# Patient Record
Sex: Female | Born: 1950
Health system: Southern US, Community
[De-identification: ages and names within clinical notes are randomized; demographics above are authoritative.]

## PROBLEM LIST (undated history)

## (undated) ENCOUNTER — Ambulatory Visit: Payer: Medicare Other

## (undated) DIAGNOSIS — F419 Anxiety disorder, unspecified: Secondary | ICD-10-CM

## (undated) DIAGNOSIS — J329 Chronic sinusitis, unspecified: Secondary | ICD-10-CM

## (undated) DIAGNOSIS — F32A Depression, unspecified: Secondary | ICD-10-CM

## (undated) DIAGNOSIS — Z8619 Personal history of other infectious and parasitic diseases: Secondary | ICD-10-CM

## (undated) DIAGNOSIS — G2581 Restless legs syndrome: Secondary | ICD-10-CM

## (undated) DIAGNOSIS — T7840XA Allergy, unspecified, initial encounter: Secondary | ICD-10-CM

## (undated) DIAGNOSIS — R7303 Prediabetes: Secondary | ICD-10-CM

## (undated) DIAGNOSIS — I639 Cerebral infarction, unspecified: Secondary | ICD-10-CM

## (undated) DIAGNOSIS — M419 Scoliosis, unspecified: Secondary | ICD-10-CM

## (undated) DIAGNOSIS — M720 Palmar fascial fibromatosis [Dupuytren]: Secondary | ICD-10-CM

## (undated) DIAGNOSIS — G709 Myoneural disorder, unspecified: Secondary | ICD-10-CM

## (undated) DIAGNOSIS — M797 Fibromyalgia: Secondary | ICD-10-CM

## (undated) DIAGNOSIS — B0229 Other postherpetic nervous system involvement: Secondary | ICD-10-CM

## (undated) DIAGNOSIS — B029 Zoster without complications: Secondary | ICD-10-CM

## (undated) DIAGNOSIS — F329 Major depressive disorder, single episode, unspecified: Secondary | ICD-10-CM

## (undated) DIAGNOSIS — I700 Atherosclerosis of aorta: Secondary | ICD-10-CM

## (undated) DIAGNOSIS — I6789 Other cerebrovascular disease: Secondary | ICD-10-CM

## (undated) DIAGNOSIS — J189 Pneumonia, unspecified organism: Secondary | ICD-10-CM

## (undated) DIAGNOSIS — J45909 Unspecified asthma, uncomplicated: Secondary | ICD-10-CM

## (undated) HISTORY — PX: VAGINAL HYSTERECTOMY: SUR661

## (undated) HISTORY — DX: Major depressive disorder, single episode, unspecified: F32.9

## (undated) HISTORY — PX: BLADDER REPAIR: SHX76

## (undated) HISTORY — DX: Allergy, unspecified, initial encounter: T78.40XA

## (undated) HISTORY — PX: TUBAL LIGATION: SHX77

## (undated) HISTORY — PX: TONSILLECTOMY: SUR1361

## (undated) HISTORY — PX: NASAL SINUS SURGERY: SHX719

## (undated) HISTORY — DX: Depression, unspecified: F32.A

---
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
## 2004-02-02 ENCOUNTER — Ambulatory Visit: Payer: Self-pay | Admitting: Internal Medicine

## 2005-05-17 ENCOUNTER — Ambulatory Visit: Payer: Self-pay | Admitting: Internal Medicine

## 2005-07-08 ENCOUNTER — Ambulatory Visit: Payer: Self-pay | Admitting: Gastroenterology

## 2007-09-17 ENCOUNTER — Ambulatory Visit: Payer: Self-pay | Admitting: Urology

## 2007-09-24 ENCOUNTER — Ambulatory Visit: Payer: Self-pay | Admitting: Urology

## 2007-11-17 ENCOUNTER — Ambulatory Visit: Payer: Self-pay | Admitting: Internal Medicine

## 2008-02-02 ENCOUNTER — Ambulatory Visit: Payer: Self-pay | Admitting: Urology

## 2008-02-08 ENCOUNTER — Ambulatory Visit: Payer: Self-pay | Admitting: Urology

## 2008-11-21 ENCOUNTER — Ambulatory Visit: Payer: Self-pay | Admitting: Family Medicine

## 2008-12-29 ENCOUNTER — Emergency Department: Payer: Self-pay | Admitting: Internal Medicine

## 2009-01-02 ENCOUNTER — Ambulatory Visit: Payer: Self-pay | Admitting: Gastroenterology

## 2009-01-13 ENCOUNTER — Ambulatory Visit: Payer: Self-pay | Admitting: Surgery

## 2009-01-17 ENCOUNTER — Inpatient Hospital Stay: Payer: Self-pay | Admitting: Surgery

## 2009-02-18 HISTORY — PX: COLONOSCOPY: SHX174

## 2009-02-18 HISTORY — PX: COLON SURGERY: SHX602

## 2009-04-02 ENCOUNTER — Ambulatory Visit: Payer: Self-pay | Admitting: Family Medicine

## 2009-06-19 ENCOUNTER — Ambulatory Visit: Payer: Self-pay | Admitting: Internal Medicine

## 2009-07-23 ENCOUNTER — Ambulatory Visit: Payer: Self-pay | Admitting: Internal Medicine

## 2009-08-17 ENCOUNTER — Ambulatory Visit: Payer: Self-pay | Admitting: Otolaryngology

## 2009-11-22 ENCOUNTER — Ambulatory Visit: Payer: Self-pay | Admitting: Family Medicine

## 2010-07-24 ENCOUNTER — Ambulatory Visit: Payer: Self-pay | Admitting: Gastroenterology

## 2011-02-27 ENCOUNTER — Ambulatory Visit: Payer: Self-pay | Admitting: Family Medicine

## 2011-03-11 ENCOUNTER — Ambulatory Visit: Payer: Self-pay

## 2011-04-18 DIAGNOSIS — J309 Allergic rhinitis, unspecified: Secondary | ICD-10-CM | POA: Insufficient documentation

## 2011-09-14 ENCOUNTER — Ambulatory Visit: Payer: Self-pay | Admitting: Medical

## 2011-12-23 DIAGNOSIS — E538 Deficiency of other specified B group vitamins: Secondary | ICD-10-CM | POA: Insufficient documentation

## 2012-04-28 ENCOUNTER — Ambulatory Visit: Payer: Self-pay | Admitting: Pediatrics

## 2013-08-12 ENCOUNTER — Ambulatory Visit: Payer: Self-pay | Admitting: Pediatrics

## 2013-11-15 DIAGNOSIS — F3341 Major depressive disorder, recurrent, in partial remission: Secondary | ICD-10-CM | POA: Insufficient documentation

## 2013-11-15 DIAGNOSIS — F339 Major depressive disorder, recurrent, unspecified: Secondary | ICD-10-CM | POA: Insufficient documentation

## 2013-11-15 DIAGNOSIS — M797 Fibromyalgia: Secondary | ICD-10-CM | POA: Insufficient documentation

## 2013-11-19 LAB — LIPID PANEL
Cholesterol: 173 mg/dL (ref 0–200)
HDL: 51 mg/dL (ref 35–70)
LDL Cholesterol: 100 mg/dL
TRIGLYCERIDES: 111 mg/dL (ref 40–160)

## 2013-11-19 LAB — BASIC METABOLIC PANEL: GLUCOSE: 106 mg/dL

## 2013-11-19 LAB — TSH: TSH: 2.19 u[IU]/mL (ref <=5.90)

## 2013-11-19 LAB — VITAMIN D 25 HYDROXY (VIT D DEFICIENCY, FRACTURES): Vit D, 25-Hydroxy: 38

## 2014-09-01 ENCOUNTER — Ambulatory Visit (INDEPENDENT_AMBULATORY_CARE_PROVIDER_SITE_OTHER): Payer: Managed Care, Other (non HMO) | Admitting: Family Medicine

## 2014-09-01 ENCOUNTER — Encounter: Payer: Self-pay | Admitting: Family Medicine

## 2014-09-01 VITALS — BP 120/78 | HR 60 | Ht 68.0 in | Wt 269.0 lb

## 2014-09-01 DIAGNOSIS — F329 Major depressive disorder, single episode, unspecified: Secondary | ICD-10-CM

## 2014-09-01 DIAGNOSIS — Z807 Family history of other malignant neoplasms of lymphoid, hematopoietic and related tissues: Secondary | ICD-10-CM | POA: Insufficient documentation

## 2014-09-01 DIAGNOSIS — M797 Fibromyalgia: Secondary | ICD-10-CM | POA: Diagnosis not present

## 2014-09-01 DIAGNOSIS — E538 Deficiency of other specified B group vitamins: Secondary | ICD-10-CM | POA: Diagnosis not present

## 2014-09-01 DIAGNOSIS — K5792 Diverticulitis of intestine, part unspecified, without perforation or abscess without bleeding: Secondary | ICD-10-CM

## 2014-09-01 DIAGNOSIS — R599 Enlarged lymph nodes, unspecified: Secondary | ICD-10-CM | POA: Diagnosis not present

## 2014-09-01 DIAGNOSIS — B029 Zoster without complications: Secondary | ICD-10-CM

## 2014-09-01 DIAGNOSIS — E559 Vitamin D deficiency, unspecified: Secondary | ICD-10-CM

## 2014-09-01 DIAGNOSIS — R739 Hyperglycemia, unspecified: Secondary | ICD-10-CM | POA: Diagnosis not present

## 2014-09-01 DIAGNOSIS — Z283 Underimmunization status: Secondary | ICD-10-CM | POA: Diagnosis not present

## 2014-09-01 DIAGNOSIS — R590 Localized enlarged lymph nodes: Secondary | ICD-10-CM

## 2014-09-01 DIAGNOSIS — J309 Allergic rhinitis, unspecified: Secondary | ICD-10-CM | POA: Diagnosis not present

## 2014-09-01 DIAGNOSIS — Z289 Immunization not carried out for unspecified reason: Secondary | ICD-10-CM

## 2014-09-01 DIAGNOSIS — F32A Depression, unspecified: Secondary | ICD-10-CM

## 2014-09-01 DIAGNOSIS — Z833 Family history of diabetes mellitus: Secondary | ICD-10-CM | POA: Insufficient documentation

## 2014-09-01 DIAGNOSIS — E669 Obesity, unspecified: Secondary | ICD-10-CM | POA: Insufficient documentation

## 2014-09-01 DIAGNOSIS — F419 Anxiety disorder, unspecified: Secondary | ICD-10-CM | POA: Insufficient documentation

## 2014-09-01 DIAGNOSIS — Z8249 Family history of ischemic heart disease and other diseases of the circulatory system: Secondary | ICD-10-CM | POA: Insufficient documentation

## 2014-09-01 DIAGNOSIS — I10 Essential (primary) hypertension: Secondary | ICD-10-CM | POA: Insufficient documentation

## 2014-09-01 HISTORY — DX: Diverticulitis of intestine, part unspecified, without perforation or abscess without bleeding: K57.92

## 2014-09-01 LAB — VITAMIN B12: Vitamin B-12: 542

## 2014-09-01 LAB — VITAMIN D 25 HYDROXY (VIT D DEFICIENCY, FRACTURES): Vit D, 25-Hydroxy: 38

## 2014-09-01 MED ORDER — VALACYCLOVIR HCL 1 G PO TABS: 1000.0000 mg | ORAL_TABLET | Freq: Every day | ORAL | Status: DC

## 2014-09-01 MED ORDER — VITAMIN D3 125 MCG (5000 UT) PO CAPS: 1.0000 | ORAL_CAPSULE | Freq: Every day | ORAL | Status: DC

## 2014-09-01 MED ORDER — VITAMIN B-12 1000 MCG PO TABS: 1000.0000 ug | ORAL_TABLET | Freq: Every day | ORAL | Status: DC

## 2014-09-01 NOTE — Progress Notes (Signed)
Date:  09/01/2014   Name:  Ann Peters   DOB:  14-Jan-1951   MRN:  [id number]  PCP:  Schuyler Amor, MD    Chief Complaint: Herpes Zoster   History of Present Illness:  This is a 64 y.o. female with hx recurrent zoster affecting L side of neck, currently on Valtrex for outbreak last week, concerned with frequent recurrence and hard lymph node behind L ear. Never tried suppressive therapy or saw ID specialist, tolerates Valtrex well. Has had multiple nasal surgeries and partial colectomy and thinks that affected her immune system leading to recurrent infectons. Takes Cymbalta for depression and fibromylagia (previously on Prozac), working well. Gets yearly mammogram which she schedules herself. Tetanus status unknown, no pneumococcal vaccines yet. On IM B12 monthly for several years, has never tried oral replacement. Takes asa for prevention, no hx diabetes or CV dz.  Review of Systems:  Review of Systems  Constitutional: Negative for fever.  HENT: Negative for ear pain and sore throat.   Eyes: Negative for pain.  Respiratory: Negative for chest tightness.   Cardiovascular: Negative for chest pain, palpitations and leg swelling.  Gastrointestinal: Negative for abdominal pain.  Endocrine: Negative for polyuria.  Genitourinary: Negative for flank pain and difficulty urinating.  Musculoskeletal: Negative for back pain.  Neurological: Negative for seizures and syncope.  Psychiatric/Behavioral: Positive for decreased concentration. Negative for confusion.    Patient Active Problem List   Diagnosis Date Noted  . Anxiety 09/01/2014  . Diverticulitis 09/01/2014  . Vitamin D deficiency 09/01/2014  . Herpes zoster 09/01/2014  . Hyperglycemia 09/01/2014  . FH: diabetes mellitus 09/01/2014  . FHx: heart disease 09/01/2014  . FH: lymphoma 09/01/2014  . Reactive cervical lymphadenopathy 09/01/2014  . Obesity, morbid, BMI 40.0-49.9 09/01/2014  . Clinical depression 11/15/2013  . Fibromyalgia  11/15/2013  . Vitamin B12 deficiency anemia 12/23/2011  . Allergic rhinitis 04/18/2011    Prior to Admission medications   Medication Sig Start Date End Date Taking? Authorizing Provider  DULoxetine (CYMBALTA) 60 MG capsule Take 60 mg by mouth daily. 11/15/13 11/15/14 Yes Historical Provider, MD  fluticasone (FLONASE) 50 MCG/ACT nasal spray Place 1 spray into both nostrils daily.   Yes Historical Provider, MD  valACYclovir (VALTREX) 1000 MG tablet Take 1 tablet (1,000 mg total) by mouth daily. 09/01/14   Schuyler Amor, MD  vitamin B-12 (CYANOCOBALAMIN) 1000 MCG tablet Take 1 tablet (1,000 mcg total) by mouth daily. 09/01/14   Schuyler Amor, MD    Allergies  Allergen Reactions  . Amoxicillin-Pot Clavulanate Other (See Comments)    Past Surgical History  Procedure Laterality Date  . Vaginal hysterectomy    . Tonsillectomy    . Tubal ligation    . Colonoscopy  2011    polyp removed- Dr Marva Panda  . Colon surgery      part of colon removed    History  Substance Use Topics  . Smoking status: Never Smoker   . Smokeless tobacco: Not on file  . Alcohol Use: No    No family history on file.  Medication list has been reviewed and updated.  Physical Examination: BP 120/78 mmHg  Pulse 60  Ht  (1.727 m)  Wt 269 lb (122.018 kg)  BMI 40.91 kg/m2  Physical Exam  Constitutional: She is oriented to person, place, and time. She appears well-developed and well-nourished.  HENT:  Head: Normocephalic and atraumatic.  Right Ear: External ear normal.  Left Ear: External ear normal.  Mouth/Throat: Oropharynx is clear and  moist.  Eyes: Conjunctivae and EOM are normal. Pupils are equal, round, and reactive to light. No scleral icterus.  Neck: Neck supple. No thyromegaly present.  Hard mobile sl tender 0.5 cm node behind L ear  Cardiovascular: Normal rate, regular rhythm and normal heart sounds.   Pulmonary/Chest: Effort normal and breath sounds normal.  Abdominal: Soft. She exhibits  no distension and no mass. There is no tenderness.  Musculoskeletal: She exhibits no edema.  Neurological: She is alert and oriented to person, place, and time. Coordination normal.  Skin: Skin is warm and dry. No rash noted.  Psychiatric: She has a normal mood and affect. Her behavior is normal.    Assessment and Plan:  1. Herpes zoster Recurrent, will try suppressive therapy with daily Valtrex, consider ID referral if ineffective - Comprehensive Metabolic Panel (CMET) - CBC  2. Reactive cervical lymphadenopathy Benign, reassured re: FH lymphoma  3. Hyperglycemia Check a1c, FH diabetes - HgB A1c  4. Vitamin D deficiency Level ok 10/15, continue supplementation  5. Obesity, morbid, BMI 40.0-49.9 Discussed weight loss, increased exercise  6. Clinical depression Well controlled on Cymbalta  7. Vitamin B12 deficiency Level ok 10/15, continue supplementation  8. Fibromyalgia Well controlled on Cymbalta  9. Allergic rhinitis, unspecified allergic rhinitis type Well controlled on Flonase  10. Delayed immunizations Needs Tdap and Prevnar (not available today), discuss next visit  11. Chronic asa use Not indicated (3728yr CV risk 4.7%), recommend d/c  Return in about 4 weeks (around 09/29/2014).  [name] [name], Jr. MD [facility]  09/01/2014

## 2014-09-01 NOTE — Patient Instructions (Signed)
Take vitamin B12 1000 mcg daily (over-the-counter) in place of vitamin B12 injections.

## 2014-09-02 LAB — CBC
HEMATOCRIT: 39.3 % (ref 34.0–46.6)
Hemoglobin: 13 g/dL (ref 11.1–15.9)
MCH: 30.4 pg (ref 26.6–33.0)
MCHC: 33.1 g/dL (ref 31.5–35.7)
MCV: 92 fL (ref 79–97)
Platelets: 369 10*3/uL (ref 150–379)
RBC: 4.28 x10E6/uL (ref 3.77–5.28)
RDW: 16.7 % — ABNORMAL HIGH (ref 12.3–15.4)
WBC: 4.9 10*3/uL (ref 3.4–10.8)

## 2014-09-02 LAB — HEMOGLOBIN A1C
ESTIMATED AVERAGE GLUCOSE: 131 mg/dL
Hgb A1c MFr Bld: 6.2 % — ABNORMAL HIGH (ref 4.8–5.6)

## 2014-09-02 LAB — COMPREHENSIVE METABOLIC PANEL
ALT: 16 IU/L (ref 0–32)
AST: 17 IU/L (ref 0–40)
Albumin/Globulin Ratio: 1.5 (ref 1.1–2.5)
Albumin: 4.3 g/dL (ref 3.6–4.8)
Alkaline Phosphatase: 106 IU/L (ref 39–117)
BILIRUBIN TOTAL: 0.3 mg/dL (ref 0.0–1.2)
BUN / CREAT RATIO: 19 (ref 11–26)
BUN: 15 mg/dL (ref 8–27)
CALCIUM: 9.9 mg/dL (ref 8.7–10.3)
CO2: 22 mmol/L (ref 18–29)
CREATININE: 0.78 mg/dL (ref 0.57–1.00)
Chloride: 99 mmol/L (ref 97–108)
GFR calc Af Amer: 92 mL/min/{1.73_m2} (ref >59)
GFR calc non Af Amer: 80 mL/min/{1.73_m2} (ref >59)
GLOBULIN, TOTAL: 2.9 g/dL (ref 1.5–4.5)
Glucose: 99 mg/dL (ref 65–99)
Potassium: 4.7 mmol/L (ref 3.5–5.2)
Sodium: 140 mmol/L (ref 134–144)
Total Protein: 7.2 g/dL (ref 6.0–8.5)

## 2014-09-08 ENCOUNTER — Other Ambulatory Visit: Payer: Self-pay

## 2014-09-08 DIAGNOSIS — B029 Zoster without complications: Secondary | ICD-10-CM

## 2014-09-08 MED ORDER — VALACYCLOVIR HCL 1 G PO TABS: 1000.0000 mg | ORAL_TABLET | Freq: Every day | ORAL | Status: DC

## 2014-10-03 ENCOUNTER — Ambulatory Visit (INDEPENDENT_AMBULATORY_CARE_PROVIDER_SITE_OTHER): Payer: Managed Care, Other (non HMO) | Admitting: Family Medicine

## 2014-10-03 ENCOUNTER — Encounter: Payer: Self-pay | Admitting: Family Medicine

## 2014-10-03 VITALS — BP 122/72 | HR 80 | Ht 68.0 in | Wt 266.0 lb

## 2014-10-03 DIAGNOSIS — E538 Deficiency of other specified B group vitamins: Secondary | ICD-10-CM | POA: Diagnosis not present

## 2014-10-03 DIAGNOSIS — B029 Zoster without complications: Secondary | ICD-10-CM | POA: Diagnosis not present

## 2014-10-03 DIAGNOSIS — J309 Allergic rhinitis, unspecified: Secondary | ICD-10-CM | POA: Diagnosis not present

## 2014-10-03 DIAGNOSIS — Z23 Encounter for immunization: Secondary | ICD-10-CM

## 2014-10-03 DIAGNOSIS — H6091 Unspecified otitis externa, right ear: Secondary | ICD-10-CM

## 2014-10-03 DIAGNOSIS — E559 Vitamin D deficiency, unspecified: Secondary | ICD-10-CM | POA: Diagnosis not present

## 2014-10-03 DIAGNOSIS — R7303 Prediabetes: Secondary | ICD-10-CM

## 2014-10-03 DIAGNOSIS — M797 Fibromyalgia: Secondary | ICD-10-CM

## 2014-10-03 DIAGNOSIS — R7309 Other abnormal glucose: Secondary | ICD-10-CM | POA: Diagnosis not present

## 2014-10-03 MED ORDER — PRAMOXINE-HC-CHLOROXYLENOL AQ 10-10-1 MG/ML OT SOLN: 4.0000 [drp] | Freq: Four times a day (QID) | OTIC | Status: DC

## 2014-10-03 NOTE — Progress Notes (Signed)
Date:  10/03/2014   Name:  Ann Peters   DOB:  02-Mar-1950   MRN:  [id number]  PCP:  Schuyler Amor, MD    Chief Complaint: No chief complaint on file.   History of Present Illness:  This is a 64 y.o. female for f/u MMP. C/o R ear pain past week, no discharge, hearing ok. Blood work last visit showed prediabetes, now on NCS diet and trying to lose weight (down 3# since last visit) and exercise more. Needs Tdap and Prevnar today. Reports Valtrex causing strange dreams but otherwise tolerating well.  Review of Systems:  Review of Systems  Respiratory: Negative for shortness of breath.   Cardiovascular: Negative for chest pain and leg swelling.  Endocrine: Negative for polyuria.    Patient Active Problem List   Diagnosis Date Noted  . Anxiety 09/01/2014  . Diverticulitis 09/01/2014  . Vitamin D deficiency 09/01/2014  . Herpes zoster 09/01/2014  . Hyperglycemia 09/01/2014  . FH: diabetes mellitus 09/01/2014  . FHx: heart disease 09/01/2014  . FH: lymphoma 09/01/2014  . Reactive cervical lymphadenopathy 09/01/2014  . Obesity, morbid, BMI 40.0-49.9 09/01/2014  . Clinical depression 11/15/2013  . Fibromyalgia 11/15/2013  . Vitamin B12 deficiency 12/23/2011  . Allergic rhinitis 04/18/2011    Prior to Admission medications   Medication Sig Start Date End Date Taking? Authorizing Provider  Cholecalciferol (VITAMIN D3) 5000 UNITS CAPS Take 1 capsule (5,000 Units total) by mouth daily. 09/01/14  Yes Schuyler Amor, MD  DULoxetine (CYMBALTA) 60 MG capsule Take 60 mg by mouth daily. 11/15/13 11/15/14 Yes Historical Provider, MD  fluticasone (FLONASE) 50 MCG/ACT nasal spray Place 1 spray into both nostrils daily.   Yes Historical Provider, MD  valACYclovir (VALTREX) 1000 MG tablet Take 1 tablet (1,000 mg total) by mouth daily. 09/08/14  Yes Schuyler Amor, MD  vitamin B-12 (CYANOCOBALAMIN) 1000 MCG tablet Take 1 tablet (1,000 mcg total) by mouth daily. 09/01/14  Yes Schuyler Amor, MD   Pramoxine-HC-Chloroxylenol Aq (CORTANE-B AQUEOUS) 10-10-1 MG/ML SOLN Place 4 drops in ear(s) 4 (four) times daily. 10/03/14   Schuyler Amor, MD    Allergies  Allergen Reactions  . Amoxicillin-Pot Clavulanate Other (See Comments)    Past Surgical History  Procedure Laterality Date  . Vaginal hysterectomy    . Tonsillectomy    . Tubal ligation    . Colonoscopy  2011    polyp removed- Dr Marva Panda  . Colon surgery      part of colon removed    Social History  Substance Use Topics  . Smoking status: Never Smoker   . Smokeless tobacco: None  . Alcohol Use: No    No family history on file.  Medication list has been reviewed and updated.  Physical Examination: BP 122/72 mmHg  Pulse 80  Ht  (1.727 m)  Wt 266 lb (120.657 kg)  BMI 40.45 kg/m2  Physical Exam  Constitutional: She appears well-developed and well-nourished.  HENT:  R EAC sl swollen and TM inflammed  Cardiovascular: Normal rate, regular rhythm and normal heart sounds.  Exam reveals no gallop.   No murmur heard. Pulmonary/Chest: Effort normal and breath sounds normal. She has no wheezes. She has no rales.  Musculoskeletal: She exhibits no edema.  Neurological: She is alert.  Skin: Skin is warm and dry.  Psychiatric: She has a normal mood and affect. Her behavior is normal.    Assessment and Plan:  1. Otitis externa, right Call if sxs worsen/persist - Pramoxine-HC-Chloroxylenol Aq (CORTANE-B AQUEOUS) 10-10-1 MG/ML  SOLN; Place 4 drops in ear(s) 4 (four) times daily.  Dispense: 10 mL; Refill: 0  2. Prediabetes Continue NCS diet, weight loss, recheck a1c next visit  3. Fibromyalgia Stable, continue Cymbalta  4. Herpes zoster Recurrent, continue daily Valtrex  5. Obesity, morbid, BMI 40.0-49.9 Encouraged exercise, weight loss  6. Vitamin B12 deficiency Continue supplementation, recheck next visit  7. Vitamin D deficiency Continue supplementation, recheck next visit  8. Allergic rhinitis,  unspecified allergic rhinitis type Stable, continue Flonase  Return in about 3 months (around 01/03/2015).  Dionne Ano. Kingsley Spittle MD [facility]  10/03/2014

## 2014-10-12 ENCOUNTER — Telehealth: Payer: Self-pay

## 2014-10-12 MED ORDER — NEOMYCIN-POLYMYXIN-HC 3.5-10000-1 OT SOLN: 4.0000 [drp] | Freq: Four times a day (QID) | OTIC | Status: DC

## 2014-10-12 NOTE — Telephone Encounter (Signed)
Patient states you sent her something for ear pain, they do not make it anymore, says [facility] sent you a message to change but has not received an answer yet.dr

## 2014-11-07 ENCOUNTER — Ambulatory Visit
Admission: EM | Admit: 2014-11-07 | Discharge: 2014-11-07 | Disposition: A | Discharge status: Home / Self Care | Payer: Commercial Indemnity | Attending: Family Medicine | Admitting: Family Medicine

## 2014-11-07 DIAGNOSIS — J0100 Acute maxillary sinusitis, unspecified: Secondary | ICD-10-CM | POA: Diagnosis not present

## 2014-11-07 MED ORDER — AZITHROMYCIN 250 MG PO TABS: ORAL_TABLET | ORAL | Status: DC

## 2014-11-07 MED ORDER — PREDNISONE 20 MG PO TABS: 40.0000 mg | ORAL_TABLET | Freq: Every day | ORAL | Status: DC

## 2014-11-07 NOTE — Discharge Instructions (Signed)
Take medication as prescribed. Rest. Drink plenty of fluids.   Follow up with your primary care physician as need next week. Return to Urgent care for new or worsening concerns.   Sinusitis Sinusitis is redness, soreness, and puffiness (inflammation) of the air pockets in the bones of your face (sinuses). The redness, soreness, and puffiness can cause air and mucus to get trapped in your sinuses. This can allow germs to grow and cause an infection.  HOME CARE   Drink enough fluids to keep your pee (urine) clear or pale yellow.  Use a humidifier in your home.  Run a hot shower to create steam in the bathroom. Sit in the bathroom with the door closed. Breathe in the steam 3-4 times a day.  Put a warm, moist washcloth on your face 3-4 times a day, or as told by your doctor.  Use salt water sprays (saline sprays) to wet the thick fluid in your nose. This can help the sinuses drain.  Only take medicine as told by your doctor. GET HELP RIGHT AWAY IF:   Your pain gets worse.  You have very bad headaches.  You are sick to your stomach (nauseous).  You throw up (vomit).  You are very sleepy (drowsy) all the time.  Your face is puffy (swollen).  Your vision changes.  You have a stiff neck.  You have trouble breathing. MAKE SURE YOU:   Understand these instructions.  Will watch your condition.  Will get help right away if you are not doing well or get worse. Document Released: 07/24/2007 Document Revised: 10/30/2011 Document Reviewed: 09/10/2011 [facility] Patient Information 2015 [place], [place]. This information is not intended to replace advice given to you by your health care provider. Make sure you discuss any questions you have with your health care provider.

## 2014-11-07 NOTE — ED Provider Notes (Signed)
[facility] Emergency Department Provider Note  ____________________________________________  Time seen: Approximately 7:00 PM  I have reviewed the triage vital signs and the nursing notes.   HISTORY  Chief Complaint Facial Pain and Otalgia   HPI JUDIANN CELIA is a 64 y.o. female presents for complaints of one week of runny nose and right ear pain. Reports sinus pressure at 6/10. States right ear treated by PCP with antibiotic ear drops and has improved. States long history of sinus infections and sinus pain, states feels similar. States feels trigger was seasonal allergies but has progressed. Denies chest pain, shortness of breath, fever or other complaints. Reports continues to eat and drink well.   Past Medical History  Diagnosis Date  . Depression     Patient Active Problem List   Diagnosis Date Noted  . Anxiety 09/01/2014  . Diverticulitis 09/01/2014  . Vitamin D deficiency 09/01/2014  . Herpes zoster 09/01/2014  . Hyperglycemia 09/01/2014  . FH: diabetes mellitus 09/01/2014  . FHx: heart disease 09/01/2014  . FH: lymphoma 09/01/2014  . Reactive cervical lymphadenopathy 09/01/2014  . Obesity, morbid, BMI 40.0-49.9 09/01/2014  . Clinical depression 11/15/2013  . Fibromyalgia 11/15/2013  . Vitamin B12 deficiency 12/23/2011  . Allergic rhinitis 04/18/2011    Past Surgical History  Procedure Laterality Date  . Vaginal hysterectomy    . Tonsillectomy    . Tubal ligation    . Colonoscopy  2011    polyp removed- Dr Marva Panda  . Colon surgery      part of colon removed    Current Outpatient Rx  Name  Route  Sig  Dispense  Refill  . aspirin EC 81 MG tablet   Oral   Take 81 mg by mouth daily.         . Cholecalciferol (VITAMIN D3) 5000 UNITS CAPS   Oral   Take 1 capsule (5,000 Units total) by mouth daily.   30 capsule      . DULoxetine (CYMBALTA) 60 MG capsule   Oral   Take 60 mg by mouth daily.         . fluticasone (FLONASE)  50 MCG/ACT nasal spray   Each Nare   Place 1 spray into both nostrils daily.         Marland Kitchen neomycin-polymyxin-hydrocortisone (CORTISPORIN) otic solution   Right Ear   Place 4 drops into the right ear 4 (four) times daily.   10 mL   0   . valACYclovir (VALTREX) 1000 MG tablet   Oral   Take 1 tablet (1,000 mg total) by mouth daily.   90 tablet   3   . vitamin B-12 (CYANOCOBALAMIN) 1000 MCG tablet   Oral   Take 1 tablet (1,000 mcg total) by mouth daily.   30 tablet   0   .           Marland Kitchen             Allergies Amoxicillin-pot clavulanate and Penicillins  Family History  Problem Relation Age of Onset  . Cancer Mother   . Lymphoma Mother     Social History Social History  Substance Use Topics  . Smoking status: Never Smoker   . Smokeless tobacco: Never Used  . Alcohol Use: No    Review of Systems Constitutional: No fever/chills Eyes: No visual changes. ENT: sinus pressure, nasal congestion, intermittent right ear pain.  Cardiovascular: Denies chest pain. Respiratory: Denies shortness of breath. Gastrointestinal: No abdominal pain.  No nausea, no  vomiting.  No diarrhea.  No constipation. Genitourinary: Negative for dysuria. Musculoskeletal: Negative for back pain. Skin: Negative for rash. Neurological: Negative for headaches, focal weakness or numbness.  10-point ROS otherwise negative.  ____________________________________________   PHYSICAL EXAM:  VITAL SIGNS: ED Triage Vitals  Enc Vitals Group     BP 11/07/14 1849 128/85 mmHg     Pulse Rate 11/07/14 1849 72     Resp 11/07/14 1849 16     Temp 11/07/14 1849 97.9 F (36.6 C)     Temp Source 11/07/14 1849 Oral     SpO2 11/07/14 1849 99 %     Weight 11/07/14 1849 268 lb (121.564 kg)     Height 11/07/14 1849 5' 8.5" (1.74 m)     Head Cir --      Peak Flow --      Pain Score 11/07/14 1853 6     Pain Loc --      Pain Edu? --      Excl. in GC? --     Constitutional: Alert and oriented. Well appearing  and in no acute distress. Eyes: Conjunctivae are normal. PERRL. EOMI. Head: Atraumatic.mod TTP right maxillary sinus, mild to mod TTP left and right frontal sinus TTP.   Ears: no erythema, normal TMs bilaterally. No swelling or exudate.   Nose: clear rhinorrhea, boggy nasal turbinates.   Mouth/Throat: Mucous membranes are moist.  Oropharynx non-erythematous. Neck: No stridor.  No cervical spine tenderness to palpation. Hematological/Lymphatic/Immunilogical: No cervical lymphadenopathy. Cardiovascular: Normal rate, regular rhythm. Grossly normal heart sounds.  Good peripheral circulation. Respiratory: Normal respiratory effort.  No retractions. Lungs CTAB. Gastrointestinal: Soft and nontender. No distention. Normal Bowel sounds.  No abdominal bruits. No CVA tenderness. Musculoskeletal: No lower or upper extremity tenderness nor edema.  No joint effusions. Bilateral pedal pulses equal and easily palpated.  Neurologic:  Normal speech and language. No gross focal neurologic deficits are appreciated. No gait instability. Skin:  Skin is warm, dry and intact. No rash noted. Psychiatric: Mood and affect are normal. Speech and behavior are normal. ____________________________________________   LABS (all labs ordered are listed, but only abnormal results are displayed)  Labs Reviewed - No data to display  INITIAL IMPRESSION / ASSESSMENT AND PLAN / ED COURSE  Pertinent labs & imaging results that were available during my care of the patient were reviewed by me and considered in my medical decision making (see chart for details).  Very well appearing. Presents with complaints of sinus congestion and pressure x one week. History of x 3 sinus surgeries with multiple sinus infections. Will treat sinusitis with azithromycin, 3 day prednisone and discussed supportive treatments. Discussed follow up with Primary care physician this week. Discussed follow up and return parameters including no resolution or  any worsening concerns. Patient verbalized understanding and agreed to plan.   ____________________________________________   FINAL CLINICAL IMPRESSION(S) / ED DIAGNOSES  Final diagnoses:  Acute maxillary sinusitis, recurrence not specified  Acute frontal sinusitis      Renford Dills, NP 11/07/14 2040

## 2014-11-07 NOTE — ED Notes (Signed)
X 5 days. Right sided facial pain and otalgia. Pt reports her maxillary and frontal sinuses are painful. Pt uses Flonase, with relief. Pt is going out of town tomorrow, and is hoping to get relief before her trip. Pt plans to see Dr. Genevive Bi after her trip.

## 2015-01-03 ENCOUNTER — Encounter: Payer: Self-pay | Admitting: Family Medicine

## 2015-01-03 ENCOUNTER — Ambulatory Visit (INDEPENDENT_AMBULATORY_CARE_PROVIDER_SITE_OTHER): Payer: Managed Care, Other (non HMO) | Admitting: Family Medicine

## 2015-01-03 VITALS — BP 110/72 | HR 72 | Ht 68.5 in | Wt 274.0 lb

## 2015-01-03 DIAGNOSIS — F32A Depression, unspecified: Secondary | ICD-10-CM

## 2015-01-03 DIAGNOSIS — E559 Vitamin D deficiency, unspecified: Secondary | ICD-10-CM | POA: Diagnosis not present

## 2015-01-03 DIAGNOSIS — E538 Deficiency of other specified B group vitamins: Secondary | ICD-10-CM | POA: Diagnosis not present

## 2015-01-03 DIAGNOSIS — Z23 Encounter for immunization: Secondary | ICD-10-CM | POA: Diagnosis not present

## 2015-01-03 DIAGNOSIS — R7303 Prediabetes: Secondary | ICD-10-CM

## 2015-01-03 DIAGNOSIS — F329 Major depressive disorder, single episode, unspecified: Secondary | ICD-10-CM | POA: Diagnosis not present

## 2015-01-03 DIAGNOSIS — M797 Fibromyalgia: Secondary | ICD-10-CM | POA: Diagnosis not present

## 2015-01-03 MED ORDER — DULOXETINE HCL 60 MG PO CPEP: 60.0000 mg | ORAL_CAPSULE | Freq: Every day | ORAL | Status: DC

## 2015-01-03 NOTE — Progress Notes (Signed)
Date:  01/03/2015   Name:  Ann Peters   DOB:  08/04/1950   MRN:  [id number]  PCP:  Schuyler AmorWilliam Jermall Isaacson, MD    Chief Complaint: No chief complaint on file.   History of Present Illness:  This is a 64 y.o. female for f/u prediabetes, depression/fibromyalgia, and B12/D deficiencies. ROE did not improve with drops and saw MUC placed on Zpak and steroids, pain persisted so saw ENT and dx'd with PHN affecting ear. Has been drinking more water to help with prediabetes. Fibromyalgia sxs well controlled but feeling more melancholy and motivation lower so wonders about augmentation with Abilify as seen on TV. Weight up 8#, unsure why. Taking B12 and vit D supplements as well as daily asa. Needs flu imm today and Pneumovax next visit.  Review of Systems:  Review of Systems  Constitutional: Negative for fever.  Respiratory: Negative for shortness of breath.   Cardiovascular: Negative for chest pain and leg swelling.  Gastrointestinal: Negative for abdominal pain.  Endocrine: Negative for polyuria.  Genitourinary: Negative for difficulty urinating.  Neurological: Negative for syncope and light-headedness.    Patient Active Problem List   Diagnosis Date Noted  . Prediabetes 01/03/2015  . Anxiety 09/01/2014  . Diverticulitis 09/01/2014  . Vitamin D deficiency 09/01/2014  . Herpes zoster 09/01/2014  . Hyperglycemia 09/01/2014  . FH: diabetes mellitus 09/01/2014  . FHx: heart disease 09/01/2014  . FH: lymphoma 09/01/2014  . Reactive cervical lymphadenopathy 09/01/2014  . Obesity, morbid, BMI 40.0-49.9 (HCC) 09/01/2014  . Clinical depression 11/15/2013  . Fibromyalgia 11/15/2013  . Vitamin B12 deficiency 12/23/2011  . Allergic rhinitis 04/18/2011    Prior to Admission medications   Medication Sig Start Date End Date Taking? Authorizing Provider  aspirin EC 81 MG tablet Take 81 mg by mouth daily.   Yes Historical Provider, MD  Cholecalciferol (VITAMIN D3) 5000 UNITS CAPS Take 1 capsule (5,000  Units total) by mouth daily. 09/01/14  Yes Schuyler AmorWilliam Kasia Trego, MD  fluticasone (FLONASE) 50 MCG/ACT nasal spray Place 1 spray into both nostrils daily.   Yes Historical Provider, MD  valACYclovir (VALTREX) 1000 MG tablet Take 1 tablet (1,000 mg total) by mouth daily. 09/08/14  Yes Schuyler AmorWilliam Ethelle Ola, MD  vitamin B-12 (CYANOCOBALAMIN) 1000 MCG tablet Take 1 tablet (1,000 mcg total) by mouth daily. 09/01/14  Yes Schuyler AmorWilliam Rafeal Skibicki, MD  DULoxetine (CYMBALTA) 60 MG capsule Take 1 capsule (60 mg total) by mouth daily. 01/03/15 01/03/16  Schuyler AmorWilliam Presley Gora, MD    Allergies  Allergen Reactions  . Amoxicillin-Pot Clavulanate Other (See Comments)  . Penicillins Nausea And Vomiting    Past Surgical History  Procedure Laterality Date  . Vaginal hysterectomy    . Tonsillectomy    . Tubal ligation    . Colonoscopy  2011    polyp removed- Dr Marva PandaSkulskie  . Colon surgery      part of colon removed    Social History  Substance Use Topics  . Smoking status: Never Smoker   . Smokeless tobacco: Never Used  . Alcohol Use: No    Family History  Problem Relation Age of Onset  . Cancer Mother   . Lymphoma Mother     Medication list has been reviewed and updated.  Physical Examination: BP 110/72 mmHg  Pulse 72  Ht 5' 8.5" (1.74 m)  Wt 274 lb (124.286 kg)  BMI 41.05 kg/m2  Physical Exam  Constitutional: She appears well-developed and well-nourished.  Cardiovascular: Normal rate, regular rhythm and normal heart sounds.   Pulmonary/Chest:  Effort normal and breath sounds normal.  Musculoskeletal: She exhibits no edema.  Neurological: She is alert.  Skin: Skin is warm and dry.  Psychiatric: She has a normal mood and affect. Her behavior is normal.  Nursing note and vitals reviewed.   Assessment and Plan:  1. Prediabetes Unclear control - HgB A1c  2. Vitamin B12 deficiency On oral supplementation - B12  3. Vitamin D deficiency On supplementation - Vitamin D (25 hydroxy)  4. Obesity, morbid, BMI  40.0-49.9 (HCC) Discussed exercise/weight loss - TSH  5. Clinical depression Refill Cymbalta, do not think Abilify risks outweigh benefits in this setting - DULoxetine (CYMBALTA) 60 MG capsule; Take 1 capsule (60 mg total) by mouth daily.  Dispense: 90 capsule; Refill: 3  6. Fibromyalgia Well controlled, cont Cymbalta - DULoxetine (CYMBALTA) 60 MG capsule; Take 1 capsule (60 mg total) by mouth daily.  Dispense: 90 capsule; Refill: 3  7. Flu vaccine need - Flu Vaccine QUAD 36+ mos PF IM (Fluarix & Fluzone Quad PF)  8. Chronic asa use No clear indication (10 yr CV risk 4%), discuss d/c again next visit  Return in about 3 months (around 04/05/2015). Consider Pneumovax then.  Dionne Ano. Kingsley Spittle MD [facility]  01/03/2015

## 2015-01-04 LAB — HEMOGLOBIN A1C
Est. average glucose Bld gHb Est-mCnc: 137 mg/dL
Hgb A1c MFr Bld: 6.4 % — ABNORMAL HIGH (ref 4.8–5.6)

## 2015-01-04 LAB — VITAMIN B12: Vitamin B-12: 288 pg/mL (ref 211–946)

## 2015-01-04 LAB — VITAMIN D 25 HYDROXY (VIT D DEFICIENCY, FRACTURES): Vit D, 25-Hydroxy: 28.5 ng/mL — ABNORMAL LOW (ref 30.0–100.0)

## 2015-01-04 LAB — TSH: TSH: 2.52 u[IU]/mL (ref 0.450–4.500)

## 2015-03-06 DIAGNOSIS — M1612 Unilateral primary osteoarthritis, left hip: Secondary | ICD-10-CM | POA: Diagnosis not present

## 2015-03-06 DIAGNOSIS — M25552 Pain in left hip: Secondary | ICD-10-CM | POA: Diagnosis not present

## 2015-03-13 DIAGNOSIS — M1612 Unilateral primary osteoarthritis, left hip: Secondary | ICD-10-CM | POA: Diagnosis not present

## 2015-03-13 DIAGNOSIS — M25552 Pain in left hip: Secondary | ICD-10-CM | POA: Diagnosis not present

## 2015-04-24 ENCOUNTER — Telehealth: Payer: Self-pay

## 2015-04-24 ENCOUNTER — Other Ambulatory Visit: Payer: Self-pay | Admitting: Family Medicine

## 2015-04-24 DIAGNOSIS — B029 Zoster without complications: Secondary | ICD-10-CM

## 2015-04-24 MED ORDER — VALACYCLOVIR HCL 1 G PO TABS
1000.0000 mg | ORAL_TABLET | Freq: Every day | ORAL | Status: DC
Start: 2015-04-24 — End: 2015-04-25

## 2015-04-24 NOTE — Telephone Encounter (Signed)
Sent to [place] 

## 2015-04-24 NOTE — Telephone Encounter (Signed)
Done

## 2015-04-25 ENCOUNTER — Other Ambulatory Visit: Payer: Self-pay

## 2015-04-25 ENCOUNTER — Telehealth: Payer: Self-pay

## 2015-04-25 DIAGNOSIS — B029 Zoster without complications: Secondary | ICD-10-CM

## 2015-04-25 MED ORDER — VALACYCLOVIR HCL 1 G PO TABS: 1000.0000 mg | ORAL_TABLET | Freq: Every day | ORAL | Status: DC

## 2015-04-25 NOTE — Telephone Encounter (Signed)
Sent to [place] 

## 2015-04-25 NOTE — Addendum Note (Signed)
Addended by: Schuyler AmorPLONK, Raygen Linquist on: 04/25/2015 09:52 AM   Modules accepted: Orders

## 2015-05-04 DIAGNOSIS — M1612 Unilateral primary osteoarthritis, left hip: Secondary | ICD-10-CM | POA: Diagnosis not present

## 2015-05-04 DIAGNOSIS — M25552 Pain in left hip: Secondary | ICD-10-CM | POA: Diagnosis not present

## 2015-05-08 ENCOUNTER — Encounter: Payer: Self-pay | Admitting: Family Medicine

## 2015-05-08 ENCOUNTER — Ambulatory Visit (INDEPENDENT_AMBULATORY_CARE_PROVIDER_SITE_OTHER): Payer: Medicare Other | Admitting: Family Medicine

## 2015-05-08 VITALS — BP 132/78 | HR 75 | Resp 16 | Ht 68.5 in | Wt 272.5 lb

## 2015-05-08 DIAGNOSIS — R7303 Prediabetes: Secondary | ICD-10-CM

## 2015-05-08 DIAGNOSIS — Z23 Encounter for immunization: Secondary | ICD-10-CM | POA: Diagnosis not present

## 2015-05-08 DIAGNOSIS — M797 Fibromyalgia: Secondary | ICD-10-CM

## 2015-05-08 DIAGNOSIS — B351 Tinea unguium: Secondary | ICD-10-CM | POA: Diagnosis not present

## 2015-05-08 DIAGNOSIS — E559 Vitamin D deficiency, unspecified: Secondary | ICD-10-CM

## 2015-05-08 DIAGNOSIS — E538 Deficiency of other specified B group vitamins: Secondary | ICD-10-CM

## 2015-05-08 MED ORDER — DULOXETINE HCL 60 MG PO CPEP: 60.0000 mg | ORAL_CAPSULE | Freq: Every day | ORAL | Status: DC

## 2015-05-08 MED ORDER — CICLOPIROX 8 % EX SOLN: Freq: Every day | CUTANEOUS | Status: DC

## 2015-05-08 NOTE — Patient Instructions (Signed)
Stop daily aspirin  

## 2015-05-09 LAB — HEMOGLOBIN A1C
Est. average glucose Bld gHb Est-mCnc: 123 mg/dL
Hgb A1c MFr Bld: 5.9 % — ABNORMAL HIGH (ref 4.8–5.6)

## 2015-05-09 LAB — VITAMIN B12: VITAMIN B 12: 568 pg/mL (ref 211–946)

## 2015-05-09 LAB — VITAMIN D 25 HYDROXY (VIT D DEFICIENCY, FRACTURES): Vit D, 25-Hydroxy: 37.9 ng/mL (ref 30.0–100.0)

## 2015-05-10 DIAGNOSIS — B351 Tinea unguium: Secondary | ICD-10-CM | POA: Insufficient documentation

## 2015-05-10 NOTE — Progress Notes (Signed)
Date:  05/08/2015   Name:  [name]   DOB:  08/15/1950   MRN:  [id number]  PCP:  Schuyler AmorWilliam Silveria Botz, MD    Chief Complaint: Blood Sugar Problem   History of Present Illness:  This is a 65 y.o. female seen for 4 month f/u. Prediabetes well controlled on NCS diet, last a1c 6.4 in Nov. Taking vit D and B12 supplements. Weight down 2#. Fibromyalgia sxs stable, needs refill Cymbalta. Had minor episode shingles since last visit but feels Valtrex suppression helping immensely. Concerned re: fungal infection L great toe. Needs Pneumovax.  Review of Systems:  Review of Systems  Constitutional: Negative for fever.  Respiratory: Negative for cough and shortness of breath.   Cardiovascular: Negative for chest pain and leg swelling.  Endocrine: Negative for polyuria.  Genitourinary: Negative for difficulty urinating.  Neurological: Negative for syncope and light-headedness.    Patient Active Problem List   Diagnosis Date Noted  . Prediabetes 01/03/2015  . Diverticulitis 09/01/2014  . Vitamin D deficiency 09/01/2014  . Herpes zoster 09/01/2014  . FH: diabetes mellitus 09/01/2014  . FHx: heart disease 09/01/2014  . FH: lymphoma 09/01/2014  . Reactive cervical lymphadenopathy 09/01/2014  . Obesity, morbid, BMI 40.0-49.9 (HCC) 09/01/2014  . Clinical depression 11/15/2013  . Fibromyalgia 11/15/2013  . Vitamin B12 deficiency 12/23/2011  . Allergic rhinitis 04/18/2011    Prior to Admission medications   Medication Sig Start Date End Date Taking? Authorizing Provider  Cholecalciferol (VITAMIN D3) 5000 UNITS CAPS Take 1 capsule (5,000 Units total) by mouth daily. 09/01/14  Yes Schuyler AmorWilliam Sammy Douthitt, MD  DULoxetine (CYMBALTA) 60 MG capsule Take 1 capsule (60 mg total) by mouth daily. 05/08/15 05/07/16 Yes Schuyler AmorWilliam Caeden Foots, MD  fluticasone (FLONASE) 50 MCG/ACT nasal spray Place 1 spray into both nostrils daily.   Yes Historical Provider, MD  Magnesium Aluminum Silicate POWD by Does not apply route.   Yes  Historical Provider, MD  valACYclovir (VALTREX) 1000 MG tablet Take 1 tablet (1,000 mg total) by mouth daily. 04/25/15  Yes Schuyler AmorWilliam Stephan Draughn, MD  vitamin B-12 (CYANOCOBALAMIN) 1000 MCG tablet Take 1 tablet (1,000 mcg total) by mouth daily. 09/01/14  Yes Schuyler AmorWilliam Emanii Bugbee, MD  ciclopirox (PENLAC) 8 % solution Apply topically at bedtime. Apply daily over affected nail and surrounding skin. 05/08/15   Schuyler AmorWilliam Lovette Merta, MD    Allergies  Allergen Reactions  . Amoxicillin-Pot Clavulanate Other (See Comments)  . Penicillins Nausea And Vomiting    Past Surgical History  Procedure Laterality Date  . Vaginal hysterectomy    . Tonsillectomy    . Tubal ligation    . Colonoscopy  2011    polyp removed- Dr Marva PandaSkulskie  . Colon surgery      part of colon removed    Social History  Substance Use Topics  . Smoking status: Never Smoker   . Smokeless tobacco: Never Used  . Alcohol Use: No    Family History  Problem Relation Age of Onset  . Cancer Mother   . Lymphoma Mother     Medication list has been reviewed and updated.  Physical Examination: BP 132/78 mmHg  Pulse 75  Resp 16  Ht 5' 8.5" (1.74 m)  Wt 272 lb 8 oz (123.605 kg)  BMI 40.83 kg/m2  SpO2 98%  Physical Exam  Constitutional: She appears well-developed and well-nourished.  Cardiovascular: Normal rate, regular rhythm and normal heart sounds.   Pulmonary/Chest: Breath sounds normal.  Musculoskeletal: She exhibits no edema.  Neurological: She is alert.  Skin: Skin  is warm and dry.  Fungal involvement L great toenail  Psychiatric: She has a normal mood and affect. Her behavior is normal.  Nursing note and vitals reviewed.   Assessment and Plan:  1. Fibromyalgia Well controlled, refill Cymbalta - DULoxetine (CYMBALTA) 60 MG capsule; Take 1 capsule (60 mg total) by mouth daily.  Dispense: 90 capsule; Refill: 3  2. Prediabetes Well controlled on NCS diet - HgB A1c  3. Onychomycosis of left great toe Penlac soln daily, discussed  potential se's oral antifungal  4. Vitamin B12 deficiency On supplement - B12  5. Vitamin D deficiency On supplement - Vitamin D (25 hydroxy)  6. Need for Pneumovax Given  7. Med review No clear indication for asa use, d/c  Return in about 6 months (around 11/08/2015).  Dionne Ano. Kingsley Spittle MD [facility]  05/10/2015

## 2015-08-30 DIAGNOSIS — M1612 Unilateral primary osteoarthritis, left hip: Secondary | ICD-10-CM | POA: Diagnosis not present

## 2015-08-30 DIAGNOSIS — M25552 Pain in left hip: Secondary | ICD-10-CM | POA: Diagnosis not present

## 2015-09-11 ENCOUNTER — Other Ambulatory Visit: Payer: Self-pay | Admitting: Physician Assistant

## 2015-09-27 ENCOUNTER — Other Ambulatory Visit: Payer: Self-pay

## 2015-09-28 ENCOUNTER — Encounter (HOSPITAL_COMMUNITY)
Admission: RE | Admit: 2015-09-28 | Discharge: 2015-09-28 | Disposition: A | Discharge status: Home / Self Care | Payer: Medicare Other | Source: Ambulatory Visit | Attending: Orthopaedic Surgery | Admitting: Orthopaedic Surgery

## 2015-09-28 ENCOUNTER — Encounter (HOSPITAL_COMMUNITY): Payer: Self-pay

## 2015-09-28 DIAGNOSIS — F419 Anxiety disorder, unspecified: Secondary | ICD-10-CM | POA: Insufficient documentation

## 2015-09-28 DIAGNOSIS — E559 Vitamin D deficiency, unspecified: Secondary | ICD-10-CM | POA: Diagnosis not present

## 2015-09-28 DIAGNOSIS — R7303 Prediabetes: Secondary | ICD-10-CM | POA: Insufficient documentation

## 2015-09-28 DIAGNOSIS — Z880 Allergy status to penicillin: Secondary | ICD-10-CM | POA: Diagnosis not present

## 2015-09-28 DIAGNOSIS — Z833 Family history of diabetes mellitus: Secondary | ICD-10-CM | POA: Insufficient documentation

## 2015-09-28 DIAGNOSIS — Z79899 Other long term (current) drug therapy: Secondary | ICD-10-CM | POA: Insufficient documentation

## 2015-09-28 DIAGNOSIS — Z01812 Encounter for preprocedural laboratory examination: Secondary | ICD-10-CM | POA: Diagnosis not present

## 2015-09-28 DIAGNOSIS — Z8249 Family history of ischemic heart disease and other diseases of the circulatory system: Secondary | ICD-10-CM | POA: Insufficient documentation

## 2015-09-28 DIAGNOSIS — M797 Fibromyalgia: Secondary | ICD-10-CM | POA: Diagnosis not present

## 2015-09-28 DIAGNOSIS — E538 Deficiency of other specified B group vitamins: Secondary | ICD-10-CM | POA: Insufficient documentation

## 2015-09-28 DIAGNOSIS — Z6839 Body mass index (BMI) 39.0-39.9, adult: Secondary | ICD-10-CM | POA: Insufficient documentation

## 2015-09-28 DIAGNOSIS — M1612 Unilateral primary osteoarthritis, left hip: Secondary | ICD-10-CM | POA: Diagnosis not present

## 2015-09-28 DIAGNOSIS — F329 Major depressive disorder, single episode, unspecified: Secondary | ICD-10-CM | POA: Diagnosis not present

## 2015-09-28 HISTORY — DX: Personal history of other infectious and parasitic diseases: Z86.19

## 2015-09-28 HISTORY — DX: Myoneural disorder, unspecified: G70.9

## 2015-09-28 HISTORY — DX: Anxiety disorder, unspecified: F41.9

## 2015-09-28 HISTORY — DX: Pneumonia, unspecified organism: J18.9

## 2015-09-28 LAB — CBC
HEMATOCRIT: 40.1 % (ref 36.0–46.0)
HEMOGLOBIN: 13.3 g/dL (ref 12.0–15.0)
MCH: 31.9 pg (ref 26.0–34.0)
MCHC: 33.2 g/dL (ref 30.0–36.0)
MCV: 96.2 fL (ref 78.0–100.0)
Platelets: 319 10*3/uL (ref 150–400)
RBC: 4.17 MIL/uL (ref 3.87–5.11)
RDW: 14.8 % (ref 11.5–15.5)
WBC: 5.4 10*3/uL (ref 4.0–10.5)

## 2015-09-28 LAB — BASIC METABOLIC PANEL
Anion gap: 11 (ref 5–15)
BUN: 15 mg/dL (ref 6–20)
CO2: 20 mmol/L — ABNORMAL LOW (ref 22–32)
Calcium: 9.7 mg/dL (ref 8.9–10.3)
Chloride: 105 mmol/L (ref 101–111)
Creatinine, Ser: 0.97 mg/dL (ref 0.44–1.00)
GFR calc Af Amer: >60 mL/min (ref >60)
GFR calc non Af Amer: 60 mL/min — ABNORMAL LOW (ref >60)
Glucose, Bld: 121 mg/dL — ABNORMAL HIGH (ref 65–99)
Potassium: 4.2 mmol/L (ref 3.5–5.1)
Sodium: 136 mmol/L (ref 135–145)

## 2015-09-28 LAB — SURGICAL PCR SCREEN
MRSA, PCR: NEGATIVE
Staphylococcus aureus: NEGATIVE

## 2015-09-28 NOTE — Pre-Procedure Instructions (Signed)
Ann Peters  09/28/2015      [id number] - [address], [place] - [address] [place] [address] Phone: [phone] Fax: [phone]  [id number] - [place], [place] - [address] AT [facility] [address] [facility] [address] Phone: [phone] Fax: [phone]    Your procedure is scheduled on  Tuesday 10/10/15  Report to [facility] Admitting at 1100 A.M.  Call this number if you have problems the morning of surgery:  [phone]   Remember:  Do not eat food or drink liquids after midnight.  Take these medicines the morning of surgery with A SIP OF WATER DULOXETINE (CYMBALTA)  STOP 7 DAYS PRIOR TO SURGERY ASPIRIN OR ASPIRIN PRODUCTS, IBUPROFEN/ ADVIL/ MOTRIN/ ALEVE, GOODY POWDERS/ BC'S, VITAMINS, HERBAL MEDS)   Do not wear jewelry, make-up or nail polish.  Do not wear lotions, powders, or perfumes.  You may wear deoderant.  Do not shave 48 hours prior to surgery.  Men may shave face and neck.  Do not bring valuables to the hospital.  [facility] is not responsible for any belongings or valuables.  Contacts, dentures or bridgework may not be worn into surgery.  Leave your suitcase in the car.  After surgery it may be brought to your room.  For patients admitted to the hospital, discharge time will be determined by your treatment team.  Patients discharged the day of surgery will not be allowed to drive home.   Name and phone number of your driver:   Special instructions:  [place] - Preparing for Surgery  Before surgery, you can play an important role.  Because skin is not sterile, your skin needs to be as free of germs as possible.  You can reduce the number of germs on you skin by washing with CHG (chlorahexidine gluconate) soap before surgery.  CHG is an antiseptic cleaner which kills germs and bonds with the skin to continue killing germs even after washing.  Please DO NOT use if  you have an allergy to CHG or antibacterial soaps.  If your skin becomes reddened/irritated stop using the CHG and inform your nurse when you arrive at Short Stay.  Do not shave (including legs and underarms) for at least 48 hours prior to the first CHG shower.  You may shave your face.  Please follow these instructions carefully:   1.  Shower with CHG Soap the night before surgery and the                                morning of Surgery.  2.  If you choose to wash your hair, wash your hair first as usual with your       normal shampoo.  3.  After you shampoo, rinse your hair and body thoroughly to remove the                      Shampoo.  4.  Use CHG as you would any other liquid soap.  You can apply chg directly       to the skin and wash gently with scrungie or a clean washcloth.  5.  Apply the CHG Soap to your body ONLY FROM THE NECK DOWN.        Do not use on open wounds or open sores.  Avoid contact with your eyes,       ears, mouth and genitals (private parts).  Wash genitals (private parts)       with your normal soap.  6.  Wash thoroughly, paying special attention to the area where your surgery        will be performed.  7.  Thoroughly rinse your body with warm water from the neck down.  8.  DO NOT shower/wash with your normal soap after using and rinsing off       the CHG Soap.  9.  Pat yourself dry with a clean towel.            10.  Wear clean pajamas.            11.  Place clean sheets on your bed the night of your first shower and do not        sleep with pets.  Day of Surgery  Do not apply any lotions/deoderants the morning of surgery.  Please wear clean clothes to the hospital/surgery center.    Please read over the following fact sheets that you were given. Coughing and Deep Breathing, MRSA Information and Surgical Site Infection Prevention

## 2015-10-09 MED ORDER — CLINDAMYCIN PHOSPHATE 900 MG/50ML IV SOLN
900.0000 mg | INTRAVENOUS | Status: AC
  Administered 2015-10-10: 900 mg via INTRAVENOUS
  Filled 2015-10-09: qty 50

## 2015-10-09 MED ORDER — TRANEXAMIC ACID 1000 MG/10ML IV SOLN
1000.0000 mg | INTRAVENOUS | Status: AC
  Administered 2015-10-10: 1000 mg via INTRAVENOUS
  Filled 2015-10-09: qty 10

## 2015-10-10 ENCOUNTER — Inpatient Hospital Stay (HOSPITAL_COMMUNITY)
Admission: RE | Admit: 2015-10-10 | Discharge: 2015-10-13 | DRG: 470 | Disposition: A | Discharge status: Home / Self Care | Payer: Medicare Other | Source: Ambulatory Visit | Attending: Orthopaedic Surgery | Admitting: Orthopaedic Surgery

## 2015-10-10 ENCOUNTER — Inpatient Hospital Stay (HOSPITAL_COMMUNITY): Payer: Medicare Other | Admitting: Anesthesiology

## 2015-10-10 ENCOUNTER — Inpatient Hospital Stay (HOSPITAL_COMMUNITY): Payer: Medicare Other

## 2015-10-10 ENCOUNTER — Encounter (HOSPITAL_COMMUNITY): Payer: Self-pay

## 2015-10-10 ENCOUNTER — Encounter (HOSPITAL_COMMUNITY)
Admission: RE | Disposition: A | Discharge status: Home / Self Care | Payer: Self-pay | Source: Ambulatory Visit | Attending: Orthopaedic Surgery

## 2015-10-10 DIAGNOSIS — Z96642 Presence of left artificial hip joint: Secondary | ICD-10-CM | POA: Diagnosis not present

## 2015-10-10 DIAGNOSIS — M797 Fibromyalgia: Secondary | ICD-10-CM | POA: Diagnosis present

## 2015-10-10 DIAGNOSIS — R7303 Prediabetes: Secondary | ICD-10-CM | POA: Diagnosis present

## 2015-10-10 DIAGNOSIS — Z833 Family history of diabetes mellitus: Secondary | ICD-10-CM | POA: Diagnosis not present

## 2015-10-10 DIAGNOSIS — M1612 Unilateral primary osteoarthritis, left hip: Principal | ICD-10-CM | POA: Diagnosis present

## 2015-10-10 DIAGNOSIS — F329 Major depressive disorder, single episode, unspecified: Secondary | ICD-10-CM | POA: Diagnosis present

## 2015-10-10 DIAGNOSIS — Z79899 Other long term (current) drug therapy: Secondary | ICD-10-CM | POA: Diagnosis not present

## 2015-10-10 DIAGNOSIS — Z6841 Body Mass Index (BMI) 40.0 and over, adult: Secondary | ICD-10-CM

## 2015-10-10 DIAGNOSIS — Z419 Encounter for procedure for purposes other than remedying health state, unspecified: Secondary | ICD-10-CM

## 2015-10-10 DIAGNOSIS — F419 Anxiety disorder, unspecified: Secondary | ICD-10-CM | POA: Diagnosis present

## 2015-10-10 DIAGNOSIS — M169 Osteoarthritis of hip, unspecified: Secondary | ICD-10-CM | POA: Diagnosis not present

## 2015-10-10 DIAGNOSIS — D62 Acute posthemorrhagic anemia: Secondary | ICD-10-CM | POA: Diagnosis not present

## 2015-10-10 DIAGNOSIS — M25552 Pain in left hip: Secondary | ICD-10-CM | POA: Diagnosis not present

## 2015-10-10 DIAGNOSIS — Z471 Aftercare following joint replacement surgery: Secondary | ICD-10-CM | POA: Diagnosis not present

## 2015-10-10 HISTORY — PX: TOTAL HIP ARTHROPLASTY: SHX124

## 2015-10-10 SURGERY — ARTHROPLASTY, HIP, TOTAL, ANTERIOR APPROACH
Anesthesia: Spinal | Site: Hip | Laterality: Left

## 2015-10-10 MED ORDER — VALACYCLOVIR HCL 500 MG PO TABS
1000.0000 mg | ORAL_TABLET | Freq: Every day | ORAL | Status: DC
  Administered 2015-10-10 – 2015-10-12 (×3): 1000 mg via ORAL
  Filled 2015-10-10 (×5): qty 2

## 2015-10-10 MED ORDER — METOCLOPRAMIDE HCL 5 MG/ML IJ SOLN: 5.0000 mg | Freq: Three times a day (TID) | INTRAMUSCULAR | Status: DC | PRN

## 2015-10-10 MED ORDER — METHOCARBAMOL 500 MG PO TABS
500.0000 mg | ORAL_TABLET | Freq: Four times a day (QID) | ORAL | Status: DC | PRN
  Administered 2015-10-10 – 2015-10-13 (×7): 500 mg via ORAL
  Filled 2015-10-10 (×8): qty 1

## 2015-10-10 MED ORDER — PROPOFOL 500 MG/50ML IV EMUL
INTRAVENOUS | Status: DC | PRN
  Administered 2015-10-10: 50 ug/kg/min via INTRAVENOUS

## 2015-10-10 MED ORDER — MIDAZOLAM HCL 2 MG/2ML IJ SOLN
INTRAMUSCULAR | Status: AC
  Filled 2015-10-10: qty 2

## 2015-10-10 MED ORDER — ONDANSETRON HCL 4 MG PO TABS: 4.0000 mg | ORAL_TABLET | Freq: Four times a day (QID) | ORAL | Status: DC | PRN

## 2015-10-10 MED ORDER — PHENOL 1.4 % MT LIQD: 1.0000 | OROMUCOSAL | Status: DC | PRN

## 2015-10-10 MED ORDER — CHLORHEXIDINE GLUCONATE 4 % EX LIQD: 60.0000 mL | Freq: Once | CUTANEOUS | Status: DC

## 2015-10-10 MED ORDER — PROPOFOL 10 MG/ML IV BOLUS
INTRAVENOUS | Status: AC
  Filled 2015-10-10: qty 20

## 2015-10-10 MED ORDER — OXYCODONE HCL 5 MG PO TABS
5.0000 mg | ORAL_TABLET | ORAL | Status: DC | PRN
  Administered 2015-10-10 – 2015-10-11 (×6): 10 mg via ORAL
  Administered 2015-10-12 (×4): 5 mg via ORAL
  Administered 2015-10-12: 10 mg via ORAL
  Administered 2015-10-13: 5 mg via ORAL
  Filled 2015-10-10: qty 2
  Filled 2015-10-10: qty 1
  Filled 2015-10-10 (×3): qty 2
  Filled 2015-10-10: qty 1
  Filled 2015-10-10: qty 2
  Filled 2015-10-10 (×2): qty 1
  Filled 2015-10-10 (×2): qty 2
  Filled 2015-10-10: qty 1

## 2015-10-10 MED ORDER — FLUTICASONE PROPIONATE 50 MCG/ACT NA SUSP
1.0000 | Freq: Every day | NASAL | Status: DC
  Administered 2015-10-11 – 2015-10-12 (×2): 1 via NASAL
  Filled 2015-10-10: qty 16

## 2015-10-10 MED ORDER — DIPHENHYDRAMINE HCL 25 MG PO CAPS
50.0000 mg | ORAL_CAPSULE | Freq: Every day | ORAL | Status: DC
  Administered 2015-10-10 – 2015-10-12 (×2): 50 mg via ORAL
  Filled 2015-10-10 (×3): qty 2

## 2015-10-10 MED ORDER — LIDOCAINE 2% (20 MG/ML) 5 ML SYRINGE
INTRAMUSCULAR | Status: AC
  Filled 2015-10-10: qty 5

## 2015-10-10 MED ORDER — POLYETHYLENE GLYCOL 3350 17 G PO PACK: 17.0000 g | PACK | Freq: Every day | ORAL | Status: DC | PRN

## 2015-10-10 MED ORDER — B COMPLEX PO TABS: 1.0000 | ORAL_TABLET | Freq: Every day | ORAL | Status: DC

## 2015-10-10 MED ORDER — FENTANYL CITRATE (PF) 100 MCG/2ML IJ SOLN
INTRAMUSCULAR | Status: AC
  Filled 2015-10-10: qty 2

## 2015-10-10 MED ORDER — SODIUM CHLORIDE 0.9 % IV SOLN
Freq: Once | INTRAVENOUS | Status: AC
  Administered 2015-10-10: 500 mL via INTRAVENOUS

## 2015-10-10 MED ORDER — ACETAMINOPHEN 650 MG RE SUPP: 650.0000 mg | Freq: Four times a day (QID) | RECTAL | Status: DC | PRN

## 2015-10-10 MED ORDER — MELATONIN 5 MG PO TABS: 5.0000 mg | ORAL_TABLET | Freq: Every day | ORAL | Status: DC

## 2015-10-10 MED ORDER — VITAMIN C 500 MG PO TABS
500.0000 mg | ORAL_TABLET | Freq: Two times a day (BID) | ORAL | Status: DC
  Administered 2015-10-10 – 2015-10-12 (×5): 500 mg via ORAL
  Filled 2015-10-10 (×6): qty 1

## 2015-10-10 MED ORDER — VITAMIN B-12 1000 MCG PO TABS
1000.0000 ug | ORAL_TABLET | Freq: Every day | ORAL | Status: DC
  Administered 2015-10-10 – 2015-10-12 (×3): 1000 ug via ORAL
  Filled 2015-10-10 (×4): qty 1

## 2015-10-10 MED ORDER — ASPIRIN EC 325 MG PO TBEC
325.0000 mg | DELAYED_RELEASE_TABLET | Freq: Two times a day (BID) | ORAL | Status: DC
  Administered 2015-10-10 – 2015-10-12 (×5): 325 mg via ORAL
  Filled 2015-10-10 (×5): qty 1

## 2015-10-10 MED ORDER — ONDANSETRON HCL 4 MG/2ML IJ SOLN
INTRAMUSCULAR | Status: AC
  Filled 2015-10-10: qty 4

## 2015-10-10 MED ORDER — SUGAMMADEX SODIUM 200 MG/2ML IV SOLN
INTRAVENOUS | Status: AC
  Filled 2015-10-10: qty 2

## 2015-10-10 MED ORDER — MENTHOL 3 MG MT LOZG: 1.0000 | LOZENGE | OROMUCOSAL | Status: DC | PRN

## 2015-10-10 MED ORDER — CLINDAMYCIN PHOSPHATE 600 MG/50ML IV SOLN
600.0000 mg | Freq: Four times a day (QID) | INTRAVENOUS | Status: AC
  Administered 2015-10-10 – 2015-10-11 (×2): 600 mg via INTRAVENOUS
  Filled 2015-10-10 (×2): qty 50

## 2015-10-10 MED ORDER — PROPOFOL 10 MG/ML IV BOLUS
INTRAVENOUS | Status: DC | PRN
  Administered 2015-10-10: 20 mg via INTRAVENOUS

## 2015-10-10 MED ORDER — LACTATED RINGERS IV SOLN
INTRAVENOUS | Status: DC | PRN
  Administered 2015-10-10 (×2): via INTRAVENOUS

## 2015-10-10 MED ORDER — PHENYLEPHRINE HCL 10 MG/ML IJ SOLN
INTRAVENOUS | Status: DC | PRN
  Administered 2015-10-10: 25 ug/min via INTRAVENOUS

## 2015-10-10 MED ORDER — METOCLOPRAMIDE HCL 5 MG PO TABS: 5.0000 mg | ORAL_TABLET | Freq: Three times a day (TID) | ORAL | Status: DC | PRN

## 2015-10-10 MED ORDER — EPHEDRINE SULFATE 50 MG/ML IJ SOLN
INTRAMUSCULAR | Status: DC | PRN
  Administered 2015-10-10 (×3): 10 mg via INTRAVENOUS

## 2015-10-10 MED ORDER — FENTANYL CITRATE (PF) 100 MCG/2ML IJ SOLN
INTRAMUSCULAR | Status: DC | PRN
  Administered 2015-10-10: 50 ug via INTRAVENOUS

## 2015-10-10 MED ORDER — MAGNESIUM OXIDE 400 (241.3 MG) MG PO TABS
400.0000 mg | ORAL_TABLET | Freq: Every day | ORAL | Status: DC
  Administered 2015-10-11 – 2015-10-12 (×2): 400 mg via ORAL
  Filled 2015-10-10 (×3): qty 1

## 2015-10-10 MED ORDER — ACETAMINOPHEN 325 MG PO TABS
650.0000 mg | ORAL_TABLET | Freq: Four times a day (QID) | ORAL | Status: DC | PRN
  Administered 2015-10-11 (×2): 650 mg via ORAL
  Filled 2015-10-10 (×2): qty 2

## 2015-10-10 MED ORDER — ONDANSETRON HCL 4 MG/2ML IJ SOLN
4.0000 mg | Freq: Four times a day (QID) | INTRAMUSCULAR | Status: DC | PRN
  Administered 2015-10-11: 4 mg via INTRAVENOUS
  Filled 2015-10-10: qty 2

## 2015-10-10 MED ORDER — VITAMIN D 1000 UNITS PO TABS
5000.0000 [IU] | ORAL_TABLET | Freq: Every day | ORAL | Status: DC
  Administered 2015-10-11 – 2015-10-12 (×2): 5000 [IU] via ORAL
  Filled 2015-10-10 (×3): qty 5

## 2015-10-10 MED ORDER — LACTATED RINGERS IV SOLN
INTRAVENOUS | Status: DC
  Administered 2015-10-10: 11:00:00 via INTRAVENOUS

## 2015-10-10 MED ORDER — ONDANSETRON HCL 4 MG/2ML IJ SOLN: 4.0000 mg | Freq: Once | INTRAMUSCULAR | Status: DC | PRN

## 2015-10-10 MED ORDER — ROCURONIUM BROMIDE 10 MG/ML (PF) SYRINGE
PREFILLED_SYRINGE | INTRAVENOUS | Status: AC
  Filled 2015-10-10: qty 10

## 2015-10-10 MED ORDER — ALUM & MAG HYDROXIDE-SIMETH 200-200-20 MG/5ML PO SUSP: 30.0000 mL | ORAL | Status: DC | PRN

## 2015-10-10 MED ORDER — SODIUM CHLORIDE 0.9 % IV SOLN
INTRAVENOUS | Status: DC
  Administered 2015-10-10: 18:00:00 via INTRAVENOUS

## 2015-10-10 MED ORDER — DOCUSATE SODIUM 100 MG PO CAPS
100.0000 mg | ORAL_CAPSULE | Freq: Two times a day (BID) | ORAL | Status: DC
  Administered 2015-10-10 – 2015-10-12 (×5): 100 mg via ORAL
  Filled 2015-10-10 (×6): qty 1

## 2015-10-10 MED ORDER — FENTANYL CITRATE (PF) 100 MCG/2ML IJ SOLN: 25.0000 ug | INTRAMUSCULAR | Status: DC | PRN

## 2015-10-10 MED ORDER — B COMPLEX-C PO TABS
1.0000 | ORAL_TABLET | Freq: Every day | ORAL | Status: DC
  Administered 2015-10-11 – 2015-10-12 (×2): 1 via ORAL
  Filled 2015-10-10 (×3): qty 1

## 2015-10-10 MED ORDER — BUPIVACAINE IN DEXTROSE 0.75-8.25 % IT SOLN
INTRATHECAL | Status: DC | PRN
  Administered 2015-10-10: 2 mL via INTRATHECAL

## 2015-10-10 MED ORDER — MAGNESIUM OXIDE 400 MG PO TABS: 400.0000 mg | ORAL_TABLET | Freq: Every day | ORAL | Status: DC

## 2015-10-10 MED ORDER — METHOCARBAMOL 1000 MG/10ML IJ SOLN
500.0000 mg | Freq: Four times a day (QID) | INTRAVENOUS | Status: DC | PRN
  Filled 2015-10-10: qty 5

## 2015-10-10 MED ORDER — DIPHENHYDRAMINE HCL 12.5 MG/5ML PO ELIX
12.5000 mg | ORAL_SOLUTION | ORAL | Status: DC | PRN
  Filled 2015-10-10: qty 10

## 2015-10-10 MED ORDER — DULOXETINE HCL 60 MG PO CPEP
60.0000 mg | ORAL_CAPSULE | Freq: Every day | ORAL | Status: DC
  Administered 2015-10-11 – 2015-10-12 (×2): 60 mg via ORAL
  Filled 2015-10-10 (×3): qty 1

## 2015-10-10 MED ORDER — HYDROMORPHONE HCL 1 MG/ML IJ SOLN
1.0000 mg | INTRAMUSCULAR | Status: DC | PRN
  Administered 2015-10-10 (×2): 1 mg via INTRAVENOUS
  Filled 2015-10-10 (×2): qty 1

## 2015-10-10 MED ORDER — VITAMIN D3 125 MCG (5000 UT) PO CAPS: 1.0000 | ORAL_CAPSULE | Freq: Every day | ORAL | Status: DC

## 2015-10-10 MED ORDER — MIDAZOLAM HCL 5 MG/5ML IJ SOLN
INTRAMUSCULAR | Status: DC | PRN
  Administered 2015-10-10: 2 mg via INTRAVENOUS

## 2015-10-10 MED ORDER — 0.9 % SODIUM CHLORIDE (POUR BTL) OPTIME
TOPICAL | Status: DC | PRN
  Administered 2015-10-10: 1000 mL

## 2015-10-10 SURGICAL SUPPLY — 50 items
BENZOIN TINCTURE PRP APPL 2/3 (GAUZE/BANDAGES/DRESSINGS) ×3 IMPLANT
BLADE SAW SGTL 18X1.27X75 (BLADE) ×2 IMPLANT
BLADE SAW SGTL 18X1.27X75MM (BLADE) ×1
BLADE SURG ROTATE 9660 (MISCELLANEOUS) IMPLANT
CAPT HIP TOTAL 2 ×3 IMPLANT
CELLS DAT CNTRL 66122 CELL SVR (MISCELLANEOUS) ×1 IMPLANT
CLOSURE WOUND 1/2 X4 (GAUZE/BANDAGES/DRESSINGS) ×2
COVER SURGICAL LIGHT HANDLE (MISCELLANEOUS) ×3 IMPLANT
DRAPE C-ARM 42X72 X-RAY (DRAPES) ×3 IMPLANT
DRAPE STERI IOBAN 125X83 (DRAPES) ×3 IMPLANT
DRAPE U-SHAPE 47X51 STRL (DRAPES) ×9 IMPLANT
DRSG AQUACEL AG ADV 3.5X10 (GAUZE/BANDAGES/DRESSINGS) ×3 IMPLANT
DURAPREP 26ML APPLICATOR (WOUND CARE) ×3 IMPLANT
ELECT BLADE 4.0 EZ CLEAN MEGAD (MISCELLANEOUS) ×3
ELECT BLADE 6.5 EXT (BLADE) IMPLANT
ELECT REM PT RETURN 9FT ADLT (ELECTROSURGICAL) ×3
ELECTRODE BLDE 4.0 EZ CLN MEGD (MISCELLANEOUS) ×1 IMPLANT
ELECTRODE REM PT RTRN 9FT ADLT (ELECTROSURGICAL) ×1 IMPLANT
FACESHIELD WRAPAROUND (MASK) ×6 IMPLANT
GLOVE BIOGEL PI IND STRL 8 (GLOVE) ×2 IMPLANT
GLOVE BIOGEL PI INDICATOR 8 (GLOVE) ×4
GLOVE ECLIPSE 8.0 STRL XLNG CF (GLOVE) ×3 IMPLANT
GLOVE ORTHO TXT STRL SZ7.5 (GLOVE) ×6 IMPLANT
GOWN STRL REUS W/ TWL LRG LVL3 (GOWN DISPOSABLE) ×2 IMPLANT
GOWN STRL REUS W/ TWL XL LVL3 (GOWN DISPOSABLE) ×2 IMPLANT
GOWN STRL REUS W/TWL LRG LVL3 (GOWN DISPOSABLE) ×4
GOWN STRL REUS W/TWL XL LVL3 (GOWN DISPOSABLE) ×4
HANDPIECE INTERPULSE COAX TIP (DISPOSABLE) ×2
KIT BASIN OR (CUSTOM PROCEDURE TRAY) ×3 IMPLANT
KIT ROOM TURNOVER OR (KITS) ×3 IMPLANT
MANIFOLD NEPTUNE II (INSTRUMENTS) ×3 IMPLANT
NS IRRIG 1000ML POUR BTL (IV SOLUTION) ×3 IMPLANT
PACK TOTAL JOINT (CUSTOM PROCEDURE TRAY) ×3 IMPLANT
PAD ARMBOARD 7.5X6 YLW CONV (MISCELLANEOUS) ×3 IMPLANT
RTRCTR WOUND ALEXIS 18CM MED (MISCELLANEOUS) ×3
SET HNDPC FAN SPRY TIP SCT (DISPOSABLE) ×1 IMPLANT
STAPLER VISISTAT 35W (STAPLE) IMPLANT
STRIP CLOSURE SKIN 1/2X4 (GAUZE/BANDAGES/DRESSINGS) ×4 IMPLANT
SUT ETHIBOND NAB CT1 #1 30IN (SUTURE) ×3 IMPLANT
SUT MNCRL AB 4-0 PS2 18 (SUTURE) IMPLANT
SUT VIC AB 0 CT1 27 (SUTURE) ×2
SUT VIC AB 0 CT1 27XBRD ANBCTR (SUTURE) ×1 IMPLANT
SUT VIC AB 1 CT1 27 (SUTURE) ×2
SUT VIC AB 1 CT1 27XBRD ANBCTR (SUTURE) ×1 IMPLANT
SUT VIC AB 2-0 CT1 27 (SUTURE) ×2
SUT VIC AB 2-0 CT1 TAPERPNT 27 (SUTURE) ×1 IMPLANT
TOWEL OR 17X24 6PK STRL BLUE (TOWEL DISPOSABLE) ×3 IMPLANT
TOWEL OR 17X26 10 PK STRL BLUE (TOWEL DISPOSABLE) ×3 IMPLANT
TRAY FOLEY CATH 16FRSI W/METER (SET/KITS/TRAYS/PACK) IMPLANT
WATER STERILE IRR 1000ML POUR (IV SOLUTION) ×6 IMPLANT

## 2015-10-10 NOTE — Anesthesia Postprocedure Evaluation (Signed)
Anesthesia Post Note  Patient: Ann Peters  Procedure(s) Performed: Procedure(s) (LRB): LEFT TOTAL HIP ARTHROPLASTY ANTERIOR APPROACH (Left)  Patient location during evaluation: PACU Anesthesia Type: Spinal Level of consciousness: oriented and awake and alert Pain management: pain level controlled Vital Signs Assessment: post-procedure vital signs reviewed and stable Respiratory status: spontaneous breathing, respiratory function stable and patient connected to nasal cannula oxygen Cardiovascular status: blood pressure returned to baseline and stable Postop Assessment: no headache, no backache, spinal receding, no signs of nausea or vomiting and patient able to bend at knees Anesthetic complications: no    Last Vitals:  Vitals:   10/10/15 1433 10/10/15 1434  BP: (!) 76/54 (!) 81/47  Pulse: 76 71  Resp: 15 10  Temp:      Last Pain:  Vitals:   10/10/15 1445  TempSrc:   PainSc: 0-No pain                 Cecile HearingStephen Edward Teng Decou

## 2015-10-10 NOTE — Anesthesia Procedure Notes (Signed)
Spinal  Patient location during procedure: OR Staffing Anesthesiologist: TURK, STEPHEN EDWARD Performed: anesthesiologist  Preanesthetic Checklist Completed: patient identified, surgical consent, pre-op evaluation, timeout performed, IV checked, risks and benefits discussed and monitors and equipment checked Spinal Block Patient position: sitting Prep: site prepped and draped and DuraPrep Patient monitoring: continuous pulse ox and blood pressure Approach: midline Location: L3-4 Injection technique: single-shot Needle Needle type: Pencan  Needle gauge: 24 G Needle length: 9 cm Additional Notes Functioning IV was confirmed and monitors were applied. Sterile prep and drape, including hand hygiene, mask and sterile gloves were used. The patient was positioned and the spine was prepped. The skin was anesthetized with lidocaine.  Free flow of clear CSF was obtained prior to injecting local anesthetic into the CSF.  The spinal needle aspirated freely following injection.  The needle was carefully withdrawn.  The patient tolerated the procedure well. Consent was obtained prior to procedure with all questions answered and concerns addressed. Risks including but not limited to bleeding, infection, nerve damage, paralysis, failed block, inadequate analgesia, allergic reaction, high spinal, itching and headache were discussed and the patient wished to proceed.   Stephen Turk, MD     

## 2015-10-10 NOTE — Transfer of Care (Signed)
Immediate Anesthesia Transfer of Care Note  Patient: Ann Peters  Procedure(s) Performed: Procedure(s): LEFT TOTAL HIP ARTHROPLASTY ANTERIOR APPROACH (Left)  Patient Location: PACU  Anesthesia Type:MAC and Spinal  Level of Consciousness: awake, alert  and oriented  Airway & Oxygen Therapy: Patient Spontanous Breathing and Patient connected to nasal cannula oxygen  Post-op Assessment: Report given to RN and Post -op Vital signs reviewed and stable  Post vital signs: Reviewed and stable  Last Vitals:  Vitals:   10/10/15 1014  BP: (!) 111/46  Pulse: 88  Resp: 18  Temp: 36.8 C    Last Pain:  Vitals:   10/10/15 1014  TempSrc: Oral         Complications: No apparent anesthesia complications

## 2015-10-10 NOTE — Op Note (Signed)
NAME:  Ann Peters, Ann Peters                 ACCOUNT NO.:  [id number]  MEDICAL RECORD NO.:  [id number]  LOCATION:  [facility]                         FACILITY:  [facility]  PHYSICIAN:  Vanita PandaChristopher Y. Magnus IvanBlackman, M.D.DATE OF BIRTH:  1950-03-30  DATE OF PROCEDURE:  10/10/2015 DATE OF DISCHARGE:                              OPERATIVE REPORT   PREOPERATIVE DIAGNOSIS:  Severe osteoarthritis and degenerative joint disease of left hip.  POSTOPERATIVE DIAGNOSIS:  Severe osteoarthritis and degenerative joint disease of left hip.  PROCEDURE:  Left total hip arthroplasty through direct anterior approach.  IMPLANTS:  DePuy Sector Gription acetabular component size 52, a single screw, size 36+ 0 neutral polyethylene liner, size 12 Corail femoral component with standard offset, size 36- 2 metal hip ball.  SURGEON:  Vanita PandaChristopher Y. Magnus IvanBlackman, M.D.  ASSISTANT:  Richardean CanalGilbert Clark, PA-C.  ANESTHESIA:  Spinal.  ANTIBIOTICS:  900 mg of IV clindamycin.  BLOOD LOSS:  Less than 500 mL.  COMPLICATIONS:  None.  INDICATIONS:  Ann Peters is a 65 year old female with debilitating arthritis involving her left hip.  She has had pain for over 10 years now after a fall down some stairs and has slowly gotten worse with time. It is detrimentally affected her activities of daily living, her mobility and her quality of life.  At this point, she does wish to proceed with a total hip arthroplasty, having failure of all methods of conservative treatment including an intra-articular injection.  She understands the risk of acute blood loss anemia, nerve and vessel injury, fracture, infection, dislocation, DVT.  She understands our goals are decreased pain, improved mobility and overall improved quality of life.  PROCEDURE DESCRIPTION:  After informed consent was obtained, appropriate left hip was marked.  She was brought to the operating room where spinal anesthesia was obtained while she was on her stretcher.  A Foley catheter was  placed.  Then, both feet had traction boots applied to them.  Of note, her leg lengths appeared equal in the supine position and we got her on the OR table, she was slightly short on her operative side, but this is how we wanted to match her.  Her left operative hip was prepped and draped with DuraPrep and sterile drapes.  Time-out was called and she was identified as correct patient and correct left hip. We then made an incision inferior and posterior to the anterior superior iliac spine and carried this obliquely down the leg.  We dissected down the tensor fascia lata muscle and the tensor fascia was then divided longitudinally, so we could proceed with a direct anterior approach to the hip.  We identified and cauterized the circumflex vessels and then identified the hip capsule.  We opened up the hip capsule in an L-type format finding moderate joint effusion and periarticular osteophytes. There was also sclerotic changes on the femoral head and cystic changes. We made our femoral neck cut proximal to the lesser trochanter and completed this with an osteotome.  We placed a corkscrew guide in the femoral head and removed the femoral head in its entirety and found to have again areas of devoid of cartilage.  We placed a bent Hohmann over the medial acetabular  rim and released the acetabular labrum and the transverse acetabular ligament.  We then began reaming under direct visualization from a size 42 reamer up to size 52 with all reamers under direct visualization and the last reamer under direct fluoroscopy, so we could obtain our depth of reaming, our inclination and anteversion. Once we were pleased with this, we placed the real DePuy Sector Gription acetabular component size 52 and a single screw.  I placed a 36+ 0 neutral polyethylene liner for that size acetabular component. Attention was then turned to the femur.  With the leg externally rotated to 120 degrees, extended and  adducted, we were able to place a Mueller retractor medially and a Hohmann retractor behind the greater trochanter.  We released the lateral joint capsule and used a box cutting osteotome to enter the femoral canal and a rongeur to lateralize.  We then began broaching from size 8 broach using the Corail broaching system up to a size 12.  With the size 12 in place, we trialed a standard offset femoral neck and a 36+ 1.5 hip ball, but this made her much too long, so we went to a 36- 2.  We were pleased with leg length, offset and stability with range of motion after that.  We then dislocated the hip and removed the trial components.  We then were able to place the real DePuy Corail femoral component size 12 with standard offset and the real 36- 2 metal hip ball, we reduced this in acetabulum and again, we were pleased with leg length, offset, and stability and range of motion.  We then irrigated the soft tissue with normal saline solution.  We closed the joint capsule with interrupted #1 Ethibond suture followed by running #1 Vicryl in the tensor fascia, 0 Vicryl in the deep tissue, 2-0 Vicryl in the subcutaneous tissue and interrupted staples on the skin.  Xeroform and Aquacel dressing were applied.  She was taken off the Hana table and taken to the recovery room in stable condition.  All final counts were correct.  There were no complications noted.  Of note, Richardean CanalGilbert Clark, PA-C assistant in the entire case.  His assistance was crucial for facilitating all aspects of this case.     Vanita Pandahristopher Y. Magnus IvanBlackman, M.D.     CYB/MEDQ  D:  10/10/2015  T:  10/10/2015  Job:  [id number]

## 2015-10-10 NOTE — H&P (Signed)
TOTAL HIP ADMISSION H&P  Patient is admitted for left total hip arthroplasty.  Subjective:  Chief Complaint: left hip pain  HPI: Ann Peters, 65 y.o. female, has a history of pain and functional disability in the left hip(s) due to arthritis and patient has failed non-surgical conservative treatments for greater than 12 weeks to include NSAID's and/or analgesics, corticosteriod injections, use of assistive devices, weight reduction as appropriate and activity modification.  Onset of symptoms was gradual starting 10 years ago with gradually worsening course since that time.The patient noted no past surgery on the left hip(s).  Patient currently rates pain in the left hip at 10 out of 10 with activity. Patient has night pain, worsening of pain with activity and weight bearing, trendelenberg gait, pain that interfers with activities of daily living and pain with passive range of motion. Patient has evidence of subchondral sclerosis, periarticular osteophytes and joint space narrowing by imaging studies. This condition presents safety issues increasing the risk of falls.  There is no current active infection.  Patient Active Problem List   Diagnosis Date Noted  . Osteoarthritis of left hip 10/10/2015  . Onychomycosis of left great toe 05/10/2015  . Prediabetes 01/03/2015  . Diverticulitis 09/01/2014  . Vitamin D deficiency 09/01/2014  . Herpes zoster 09/01/2014  . FH: diabetes mellitus 09/01/2014  . FHx: heart disease 09/01/2014  . FH: lymphoma 09/01/2014  . Reactive cervical lymphadenopathy 09/01/2014  . Obesity, morbid, BMI 40.0-49.9 (HCC) 09/01/2014  . Clinical depression 11/15/2013  . Fibromyalgia 11/15/2013  . Vitamin B12 deficiency 12/23/2011  . Allergic rhinitis 04/18/2011   Past Medical History:  Diagnosis Date  . Anxiety   . Depression   . History of shingles    ON AND OFF 6 YEARS  . Neuromuscular disorder (HCC)    POSTHERPETIC NEURALGIA  . Pneumonia    HX   SEVERAL TIMES   NONE IN 3 YRS    Past Surgical History:  Procedure Laterality Date  . BLADDER REPAIR    . COLON SURGERY     part of colon removed  . COLONOSCOPY  2011   polyp removed- Dr Marva PandaSkulskie  . NASAL SINUS SURGERY    . TONSILLECTOMY    . TUBAL LIGATION    . VAGINAL HYSTERECTOMY      Prescriptions Prior to Admission  Medication Sig Dispense Refill Last Dose  . APPLE CIDER VINEGAR PO Take 30 mLs by mouth daily.   10/09/2015 at Unknown time  . Ascorbic Acid (VITAMIN C) 1000 MG tablet Take 500 mg by mouth 2 (two) times daily.   10/09/2015 at Unknown time  . b complex vitamins tablet Take 1 tablet by mouth daily.   10/09/2015 at Unknown time  . Cholecalciferol (VITAMIN D3) 5000 UNITS CAPS Take 1 capsule (5,000 Units total) by mouth daily. 30 capsule  10/09/2015 at Unknown time  . diphenhydrAMINE (BENADRYL) 25 MG tablet Take 50-75 mg by mouth at bedtime.   10/09/2015 at Unknown time  . DULoxetine (CYMBALTA) 60 MG capsule Take 1 capsule (60 mg total) by mouth daily. 90 capsule 3 10/10/2015 at Unknown time  . fluticasone (FLONASE) 50 MCG/ACT nasal spray Place 1 spray into both nostrils daily.   10/10/2015 at Unknown time  . L-LYSINE PO Take 1 tablet by mouth daily.   10/09/2015 at Unknown time  . magnesium oxide (MAG-OX) 400 MG tablet Take 400 mg by mouth daily.   10/09/2015 at Unknown time  . Melatonin 5 MG TABS Take 5 mg by mouth  at bedtime.   10/09/2015 at Unknown time  . valACYclovir (VALTREX) 1000 MG tablet Take 1 tablet (1,000 mg total) by mouth daily. 90 tablet 3 10/09/2015 at Unknown time  . vitamin B-12 (CYANOCOBALAMIN) 1000 MCG tablet Take 1 tablet (1,000 mcg total) by mouth daily. 30 tablet 0 10/09/2015 at Unknown time   Allergies  Allergen Reactions  . Amoxicillin-Pot Clavulanate Other (See Comments)    UNSPECIFIED   . Penicillins Nausea And Vomiting    Has patient had a PCN reaction causing immediate rash, facial/tongue/throat swelling, SOB or lightheadedness with hypotension: no Has patient had a  PCN reaction causing severe rash involving mucus membranes or skin necrosis: No Has patient had a PCN reaction that required hospitalization No Has patient had a PCN reaction occurring within the last 10 years: Yes If all of the above answers are "NO", then may proceed with Cephalosporin use.     Social History  Substance Use Topics  . Smoking status: Never Smoker  . Smokeless tobacco: Never Used  . Alcohol use No    Family History  Problem Relation Age of Onset  . Cancer Mother   . Lymphoma Mother      Review of Systems  Musculoskeletal: Positive for joint pain and myalgias.  All other systems reviewed and are negative.   Objective:  Physical Exam  Constitutional: She is oriented to person, place, and time. She appears well-developed and well-nourished.  HENT:  Head: Normocephalic and atraumatic.  Eyes: EOM are normal. Pupils are equal, round, and reactive to light.  Neck: Normal range of motion. Neck supple.  Cardiovascular: Normal rate and regular rhythm.   Respiratory: Effort normal and breath sounds normal.  GI: Soft. Bowel sounds are normal.  Musculoskeletal:       Left hip: She exhibits decreased range of motion, decreased strength, tenderness and bony tenderness.  Neurological: She is alert and oriented to person, place, and time.  Skin: Skin is warm and dry.  Psychiatric: She has a normal mood and affect.    Vital signs in last 24 hours: Temp:  [98.2 F (36.8 C)] 98.2 F (36.8 C) (08/22 1014) Pulse Rate:  [88] 88 (08/22 1014) Resp:  [18] 18 (08/22 1014) BP: (111)/(46) 111/46 (08/22 1014) SpO2:  [96 %] 96 % (08/22 1014)  Labs:   Estimated body mass index is 39.3 kg/m as calculated from the following:   Height as of 09/28/15: 5' 8.5" (1.74 m).   Weight as of 09/28/15: 119 kg (262 lb 4.8 oz).   Imaging Review Plain radiographs demonstrate severe degenerative joint disease of the left hip(s). The bone quality appears to be good for age and reported  activity level.  Assessment/Plan:  End stage arthritis, left hip(s)  The patient history, physical examination, clinical judgement of the provider and imaging studies are consistent with end stage degenerative joint disease of the left hip(s) and total hip arthroplasty is deemed medically necessary. The treatment options including medical management, injection therapy, arthroscopy and arthroplasty were discussed at length. The risks and benefits of total hip arthroplasty were presented and reviewed. The risks due to aseptic loosening, infection, stiffness, dislocation/subluxation,  thromboembolic complications and other imponderables were discussed.  The patient acknowledged the explanation, agreed to proceed with the plan and consent was signed. Patient is being admitted for inpatient treatment for surgery, pain control, PT, OT, prophylactic antibiotics, VTE prophylaxis, progressive ambulation and ADL's and discharge planning.The patient is planning to be discharged home with home health services

## 2015-10-10 NOTE — Progress Notes (Signed)
Orthopedic Tech Progress Note Patient Details:  Theophilus KindsBetty L Etzkorn 03/04/1950 [id number]  Ortho Devices Ortho Device/Splint Location: applied ohf to bed Ortho Device/Splint Interventions: Ordered, Application   Jennye MoccasinHughes, Asah Lamay Craig 10/10/2015, 7:08 PM

## 2015-10-10 NOTE — Brief Op Note (Signed)
10/10/2015  1:50 PM  PATIENT:  Ann KindsBetty L Peters  65 y.o. female  PRE-OPERATIVE DIAGNOSIS:  osteoarthritis left hip  POST-OPERATIVE DIAGNOSIS:  osteoarthritis left hip  PROCEDURE:  Procedure(s): LEFT TOTAL HIP ARTHROPLASTY ANTERIOR APPROACH (Left)  SURGEON:  Surgeon(s) and Role:    * Kathryne Hitchhristopher Y Brindle Leyba, MD - Primary  PHYSICIAN ASSISTANT: Rexene EdisonGil Clark, PA-C  ANESTHESIA:   spinal  EBL:  Total I/O In: 1580 [I.V.:1470; IV Piggyback:110] Out: 350 [Urine:100; Blood:250]  COUNTS:  YES  DICTATION: .Other Dictation: Dictation Number [id number]  PLAN OF CARE: Admit to inpatient   PATIENT DISPOSITION:  PACU - hemodynamically stable.   Delay start of Pharmacological VTE agent (>24hrs) due to surgical blood loss or risk of bleeding: no

## 2015-10-10 NOTE — Anesthesia Procedure Notes (Signed)
Procedure Name: MAC Date/Time: 10/10/2015 12:19 PM Performed by: Carmela RimaMARTINELLI, Nohealani Medinger F Pre-anesthesia Checklist: Timeout performed, Patient being monitored, Suction available, Emergency Drugs available and Patient identified Oxygen Delivery Method: Nasal cannula Placement Confirmation: positive ETCO2 Dental Injury: Teeth and Oropharynx as per pre-operative assessment

## 2015-10-10 NOTE — Anesthesia Preprocedure Evaluation (Addendum)
Anesthesia Evaluation  Patient identified by MRN, date of birth, ID band Patient awake    Reviewed: Allergy & Precautions, NPO status , Patient's Chart, lab work & pertinent test results  Airway Mallampati: III  TM Distance: >3 FB Neck ROM: Full    Dental  (+) Teeth Intact, Dental Advisory Given   Pulmonary neg pulmonary ROS,    Pulmonary exam normal breath sounds clear to auscultation       Cardiovascular Exercise Tolerance: Good negative cardio ROS Normal cardiovascular exam Rhythm:Regular Rate:Normal     Neuro/Psych PSYCHIATRIC DISORDERS Anxiety Depression  Neuromuscular disease (post-herpetic neuralgia)    GI/Hepatic negative GI ROS, Neg liver ROS,   Endo/Other  Obesity   Renal/GU negative Renal ROS     Musculoskeletal  (+) Fibromyalgia -  Abdominal   Peds  Hematology negative hematology ROS (+) Plt 319k on 09/28/15   Anesthesia Other Findings Day of surgery medications reviewed with the patient.  Reproductive/Obstetrics                            Anesthesia Physical Anesthesia Plan  ASA: II  Anesthesia Plan: Spinal   Post-op Pain Management:    Induction:   Airway Management Planned:   Additional Equipment:   Intra-op Plan:   Post-operative Plan:   Informed Consent: I have reviewed the patients History and Physical, chart, labs and discussed the procedure including the risks, benefits and alternatives for the proposed anesthesia with the patient or authorized representative who has indicated his/her understanding and acceptance.   Dental advisory given and Dental Advisory Given  Plan Discussed with: CRNA, Anesthesiologist and Surgeon  Anesthesia Plan Comments: (Discussed risks and benefits of and differences between spinal and general. Discussed risks of spinal including headache, backache, failure, bleeding, infection, and nerve damage. Patient consents to spinal.  Questions answered. Coagulation studies and platelet count acceptable.)       Anesthesia Quick Evaluation

## 2015-10-11 ENCOUNTER — Encounter (HOSPITAL_COMMUNITY): Payer: Self-pay | Admitting: Orthopaedic Surgery

## 2015-10-11 LAB — CBC
HEMATOCRIT: 33.1 % — AB (ref 36.0–46.0)
Hemoglobin: 10.8 g/dL — ABNORMAL LOW (ref 12.0–15.0)
MCH: 31.6 pg (ref 26.0–34.0)
MCHC: 32.6 g/dL (ref 30.0–36.0)
MCV: 96.8 fL (ref 78.0–100.0)
PLATELETS: 302 10*3/uL (ref 150–400)
RBC: 3.42 MIL/uL — AB (ref 3.87–5.11)
RDW: 14.9 % (ref 11.5–15.5)
WBC: 7.2 10*3/uL (ref 4.0–10.5)

## 2015-10-11 LAB — BASIC METABOLIC PANEL
Anion gap: 7 (ref 5–15)
BUN: 14 mg/dL (ref 6–20)
CALCIUM: 9.1 mg/dL (ref 8.9–10.3)
CHLORIDE: 106 mmol/L (ref 101–111)
CO2: 23 mmol/L (ref 22–32)
CREATININE: 1.01 mg/dL — AB (ref 0.44–1.00)
GFR calc Af Amer: >60 mL/min (ref >60)
GFR calc non Af Amer: 57 mL/min — ABNORMAL LOW (ref >60)
Glucose, Bld: 144 mg/dL — ABNORMAL HIGH (ref 65–99)
Potassium: 3.9 mmol/L (ref 3.5–5.1)
SODIUM: 136 mmol/L (ref 135–145)

## 2015-10-11 MED ORDER — ZOLPIDEM TARTRATE 5 MG PO TABS
5.0000 mg | ORAL_TABLET | Freq: Every day | ORAL | Status: DC
  Administered 2015-10-11 – 2015-10-12 (×2): 5 mg via ORAL
  Filled 2015-10-11 (×2): qty 1

## 2015-10-11 NOTE — Progress Notes (Signed)
Spoke to Dr Roda ShuttersXu about patient not voiding since foley removed this am and the bladder scan results that were done today, we are to keep doing bladder scans per policy

## 2015-10-11 NOTE — Progress Notes (Signed)
Physical Therapy Treatment Patient Details Name: Ann Peters MRN: [id number] DOB: 04/19/1950 Today's Date: 10/11/2015    History of Present Illness pt is a 65 y/o female s/p L THA anterior approach. PMH including but not limited to obesity and extended hx of shingles.    PT Comments    Pt presented sitting OOB in recliner when PT entered room. Pt reported increased nausea and had one episode of emesis during session while sitting. Pt was able to ambulate this session, 5 ft with use of RW and min guard for safety. Pt would continue to benefit from skilled physical therapy services at this time while admitted and after d/c to address her limitations in order to improve her overall safety and independence with functional mobility. PT plan to further gait train at next session.   Follow Up Recommendations  Home health PT;Supervision for mobility/OOB     Equipment Recommendations  Rolling walker with 5" wheels;3in1 (PT)    Recommendations for Other Services       Precautions / Restrictions Precautions Precautions: Fall Restrictions Weight Bearing Restrictions: Yes LLE Weight Bearing: Weight bearing as tolerated    Mobility  Bed Mobility Overal bed mobility: Needs Assistance Bed Mobility: Sit to Supine     Supine to sit: Min assist;HOB elevated Sit to supine: Min assist   General bed mobility comments: pt required increased time, use of bed rails and min A with L LE movement  Transfers Overall transfer level: Needs assistance Equipment used: Rolling walker (2 wheeled) Transfers: Sit to/from Stand Sit to Stand: Min assist Stand pivot transfers: Min assist       General transfer comment: pt required increased time, VC'ing for bilateral hand positioning and min A with stabilizing and moving RW  Ambulation/Gait Ambulation/Gait assistance: Min guard Ambulation Distance (Feet): 5 Feet Assistive device: Rolling walker (2 wheeled) Gait Pattern/deviations: Step-to  pattern;Decreased step length - right;Decreased stance time - left;Decreased weight shift to left Gait velocity: decreased Gait velocity interpretation: Below normal speed for age/gender General Gait Details: did not occur secondary to pain   Stairs            Wheelchair Mobility    Modified Rankin (Stroke Patients Only)       Balance Overall balance assessment: Needs assistance Sitting-balance support: Feet supported;Bilateral upper extremity supported Sitting balance-Leahy Scale: Poor     Standing balance support: During functional activity;Bilateral upper extremity supported Standing balance-Leahy Scale: Poor Standing balance comment: pt reliant on RW for stability and support                    Cognition Arousal/Alertness: Awake/alert Behavior During Therapy: WFL for tasks assessed/performed Overall Cognitive Status: Within Functional Limits for tasks assessed                      Exercises Total Joint Exercises Ankle Circles/Pumps: AROM;Both;10 reps;Seated Quad Sets: AROM;Strengthening;Left;10 reps;Seated Hip ABduction/ADduction: AAROM;Strengthening;Left;5 reps;Seated Long Arc Quad: AAROM;Left;10 reps;Seated Marching in Standing: AAROM;Left;10 reps;Seated    General Comments        Pertinent Vitals/Pain Pain Assessment: Faces Pain Score: 7  Faces Pain Scale: Hurts even more Pain Location: L hip Pain Descriptors / Indicators: Sharp;Stabbing Pain Intervention(s): Monitored during session;Repositioned    Home Living Family/patient expects to be discharged to:: Private residence Living Arrangements: Spouse/significant other Available Help at Discharge: Family;Available PRN/intermittently Type of Home: House Home Access: Stairs to enter Entrance Stairs-Rails: Can reach both Home Layout: One level Home Equipment: Dan HumphreysWalker -  standard;Cane - single point      Prior Function Level of Independence: Independent with assistive device(s)       Comments: pt stated that she would ambulate with use of cane   PT Goals (current goals can now be found in the care plan section) Acute Rehab PT Goals Patient Stated Goal: return home PT Goal Formulation: With patient/family Time For Goal Achievement: 10/18/15 Potential to Achieve Goals: Good Progress towards PT goals: Progressing toward goals    Frequency  7X/week    PT Plan Current plan remains appropriate    Co-evaluation             End of Session Equipment Utilized During Treatment: Gait belt Activity Tolerance: Patient limited by pain;Other (comment) (pt limited by nausea and emesis) Patient left: in bed;with call bell/phone within reach     Time: 1325-1350 PT Time Calculation (min) (ACUTE ONLY): 25 min  Charges:  $Gait Training: 8-22 mins $Therapeutic Exercise: 8-22 mins $Therapeutic Activity: 8-22 mins                    G CodesAlessandra Bevels:      Berda Shelvin M Jerami Tammen 10/11/2015, 2:52 PM Deborah ChalkJennifer Bertin Inabinet, PT, DPT [phone]

## 2015-10-11 NOTE — Progress Notes (Signed)
Subjective: 1 Day Post-Op Procedure(s) (LRB): LEFT TOTAL HIP ARTHROPLASTY ANTERIOR APPROACH (Left) Patient reports pain as moderate.  Minimal acute bloos loss anemia from surgery, but asymptomatic.  Objective: Vital signs in last 24 hours: Temp:  [97.7 F (36.5 C)-98.6 F (37 C)] 98.6 F (37 C) (08/23 0500) Pulse Rate:  [57-88] 88 (08/23 0500) Resp:  [0-20] 20 (08/23 0500) BP: (76-135)/(38-117) 128/81 (08/23 0500) SpO2:  [92 %-100 %] 92 % (08/23 0500)  Intake/Output from previous day: 08/22 0701 - 08/23 0700 In: 2763.8 [I.V.:2553.8; IV Piggyback:210] Out: 1000 [Urine:750; Blood:250] Intake/Output this shift: Total I/O In: 1033.8 [I.V.:933.8; IV Piggyback:100] Out: 500 [Urine:500]   Recent Labs  10/11/15 0344  HGB 10.8*    Recent Labs  10/11/15 0344  WBC 7.2  RBC 3.42*  HCT 33.1*  PLT 302    Recent Labs  10/11/15 0344  NA 136  K 3.9  CL 106  CO2 23  BUN 14  CREATININE 1.01*  GLUCOSE 144*  CALCIUM 9.1   No results for input(s): LABPT, INR in the last 72 hours.  Sensation intact distally Intact pulses distally Dorsiflexion/Plantar flexion intact Incision: scant drainage  Assessment/Plan: 1 Day Post-Op Procedure(s) (LRB): LEFT TOTAL HIP ARTHROPLASTY ANTERIOR APPROACH (Left) Up with therapy  Ann Peters Y 10/11/2015, 6:50 AM

## 2015-10-11 NOTE — Evaluation (Signed)
Physical Therapy Evaluation Patient Details Name: Ann Peters MRN: [id number] DOB: 06/27/1950 Today's Date: 10/11/2015   History of Present Illness  pt is a 65 y/o female s/p L THA anterior approach. PMH including but not limited to obesity and extended hx of shingles.  Clinical Impression  Pt presented supine in bed with HOB elevated, awake and willing to participate in therapy session. Pt's husband was present throughout session. Pt's nurse also in room at beginning of session administering medications. Pt was limited secondary to pain throughout session. She was able to transfer from the bed to the recliner via SPT with min A and use of RW. Pt would continue to benefit from skilled physical therapy services at this time while admitted and after d/c to address her below listed limitations in order to improve her overall safety and independence with functional mobility. PT plan to gait train at next session.      Follow Up Recommendations Home health PT;Supervision for mobility/OOB    Equipment Recommendations  Rolling walker with 5" wheels;3in1 (PT)    Recommendations for Other Services       Precautions / Restrictions Precautions Precautions: Fall Restrictions Weight Bearing Restrictions: Yes LLE Weight Bearing: Weight bearing as tolerated      Mobility  Bed Mobility Overal bed mobility: Needs Assistance Bed Mobility: Supine to Sit     Supine to sit: Min assist;HOB elevated     General bed mobility comments: pt required increased time, use of bed rails and min A with L LE movement  Transfers Overall transfer level: Needs assistance Equipment used: Rolling walker (2 wheeled) Transfers: Sit to/from [employer] CorporationStand;Stand Pivot Transfers Sit to Stand: Min assist Stand pivot transfers: Min assist       General transfer comment: pt required increased time, VC'ing for bilateral hand positioning and min A with stabilizing and moving RW  Ambulation/Gait             General  Gait Details: did not occur secondary to pain  Stairs            Wheelchair Mobility    Modified Rankin (Stroke Patients Only)       Balance Overall balance assessment: Needs assistance Sitting-balance support: Feet supported;Bilateral upper extremity supported Sitting balance-Leahy Scale: Poor     Standing balance support: During functional activity;Bilateral upper extremity supported Standing balance-Leahy Scale: Poor Standing balance comment: pt reliant on RW for stability and support                             Pertinent Vitals/Pain Pain Assessment: 0-10 Pain Score: 7  Pain Location: L hip Pain Descriptors / Indicators: Sharp;Stabbing;Grimacing;Guarding;Moaning Pain Intervention(s): Monitored during session;Repositioned;RN gave pain meds during session    Home Living Family/patient expects to be discharged to:: Private residence Living Arrangements: Spouse/significant other Available Help at Discharge: Family;Available PRN/intermittently Type of Home: House Home Access: Stairs to enter Entrance Stairs-Rails: Can reach both Entrance Stairs-Number of Steps: 3 Home Layout: One level Home Equipment: Walker - standard;Cane - single point      Prior Function Level of Independence: Independent with assistive device(s)         Comments: pt stated that she would ambulate with use of cane     Hand Dominance        Extremity/Trunk Assessment   Upper Extremity Assessment: Defer to OT evaluation           Lower Extremity Assessment: LLE deficits/detail  LLE Deficits / Details: Pt with decreased strength and ROM limitations secondary to post-op. Pt reported decreased sensation in L thigh and L lower leg.  Cervical / Trunk Assessment: Normal  Communication   Communication: No difficulties  Cognition Arousal/Alertness: Awake/alert Behavior During Therapy: WFL for tasks assessed/performed Overall Cognitive Status: Within Functional Limits  for tasks assessed                      General Comments      Exercises Total Joint Exercises Ankle Circles/Pumps: AROM;Both;10 reps;Seated Quad Sets: AROM;Strengthening;Left;10 reps;Seated Hip ABduction/ADduction: AAROM;Strengthening;Left;5 reps;Seated Long Arc Quad: AROM;Strengthening;Left;5 reps;Seated      Assessment/Plan    PT Assessment Patient needs continued PT services  PT Diagnosis Difficulty walking   PT Problem List Decreased strength;Decreased range of motion;Decreased activity tolerance;Decreased balance;Decreased mobility;Decreased coordination;Decreased knowledge of use of DME;Pain  PT Treatment Interventions DME instruction;Gait training;Functional mobility training;Stair training;Therapeutic activities;Balance training;Therapeutic exercise;Neuromuscular re-education;Patient/family education   PT Goals (Current goals can be found in the Care Plan section) Acute Rehab PT Goals Patient Stated Goal: return home PT Goal Formulation: With patient/family Time For Goal Achievement: 10/18/15 Potential to Achieve Goals: Good    Frequency 7X/week   Barriers to discharge        Co-evaluation               End of Session Equipment Utilized During Treatment: Gait belt Activity Tolerance: Patient limited by pain Patient left: in chair;with call bell/phone within reach;with family/visitor present Nurse Communication: Mobility status         Time: 1610-96041019-1053 PT Time Calculation (min) (ACUTE ONLY): 34 min   Charges:   PT Evaluation $PT Eval Moderate Complexity: 1 Procedure PT Treatments $Therapeutic Activity: 8-22 mins   PT G CodesAlessandra Peters:        Ann Peters 10/11/2015, 11:14 AM Deborah ChalkJennifer Hiram Mciver, PT, DPT [phone]

## 2015-10-11 NOTE — Progress Notes (Signed)
OT Cancellation Note  Patient Details Name: Ann Peters MRN: [id number] DOB: 06/21/1950   Cancelled Treatment:    Reason Eval/Treat Not Completed: Medical issues which prohibited therapy. Pt had just vomited. Will follow.  Evern BioMayberry, [place] Penning Lynn 10/11/2015, 1:58 PM  [phone]

## 2015-10-12 NOTE — Progress Notes (Signed)
Pt sitting in recliner visiting with husband awaiting for lunch. Pt up with PT and followed commands successfully. Pt has been only getting one oxycodone and Robaxin for pain per MD order. Pt repts that she is "much more alert and able to think much more clearer". No further impulsive behaviors noted. Pain controlled. Pt no longer noted to be anxious. Will continue to monitor.

## 2015-10-12 NOTE — Progress Notes (Signed)
RN to room. [facility] without calling. Pt unable to follow commands. Pt 2+ mod [facility] Pt states over and over, "I am anxious. I can't take two of those pain pills they make me crazy". Pt assured that we would decrease her pain med to one per MD order and utilize the robaxin more as per MD prn order. Pt assisted back to bed after successfully voided. Pt was incontinent a small amount on [facility]. Pt bed alarm applied. Call bell within reach. Will continue to monitor.

## 2015-10-12 NOTE — Care Management Note (Signed)
Case Management Note  Patient Details  Name: Ann Peters MRN: [id number] Date of Birth: 08/31/1950  Subjective/Objective:   65 yr old female s/p left total hip arthroplasty, anterior approach.                  Action/Plan: Case manager spoke with patient and her husband concerning home health and DME needs. Patient was preoperatively setup with [facility], no changes. She will have family support at discharge..   Expected Discharge Date:   10/12/15               Expected Discharge Plan:  Home w Home Health Services  In-House Referral:     Discharge planning Services  CM Consult  Post Acute Care Choice:  Durable Medical Equipment, Home Health Choice offered to:  Patient  DME Arranged:  3-N-1, Walker rolling DME Agency:  [facility]  HH Arranged:  [facility]  Status of Service:  Completed, signed off  If discussed at [employer] Length of Stay Meetings, dates discussed:    Additional Comments:  Durenda GuthrieBrady, Mescal Flinchbaugh Naomi, RN 10/12/2015, 3:08 PM

## 2015-10-12 NOTE — Evaluation (Signed)
Occupational Therapy Evaluation Patient Details Name: Ann Peters MRN: [id number] DOB: 11/01/1950 Today's Date: 10/12/2015    History of Present Illness pt is a 65 y/o female s/p L THA anterior approach. PMH including but not limited to obesity and extended hx of shingles.   Clinical Impression   Pt was independent in self care prior to admission. Presents with decreased safety awareness and distractibility with poor standing balance. Pt requires min assist for dressing L LE, will rely on assist of her husband as she can nearly reach her foot in sitting. She is aware of availability of AE. Educated in safe footwear and multiple uses of 3 in 1. Will follow acutely.    Follow Up Recommendations  No OT follow up    Equipment Recommendations  3 in 1 bedside comode    Recommendations for Other Services       Precautions / Restrictions Precautions Precautions: Fall Restrictions Weight Bearing Restrictions: Yes LLE Weight Bearing: Weight bearing as tolerated      Mobility Bed Mobility Overal bed mobility: Needs Assistance Bed Mobility: Sit to Supine       Sit to supine: Min assist   General bed mobility comments: assist for L LE, attempted to instruct in self assisting with R LE, but pt too distracted  Transfers Overall transfer level: Needs assistance Equipment used: Rolling walker (2 wheeled) Transfers: Sit to/from Stand Sit to Stand: Supervision         General transfer comment: increased time, no physical assist or cues from recliner and 3 in 1    Balance     Sitting balance-Leahy Scale: Good       Standing balance-Leahy Scale: Poor                              ADL Overall ADL's : Needs assistance/impaired Eating/Feeding: Independent   Grooming: Wash/dry hands;Standing;Supervision/safety   Upper Body Bathing: Min guard;Sitting   Lower Body Bathing: Minimal assistance;Sit to/from stand   Upper Body Dressing : Set up;Sitting   Lower  Body Dressing: Supervision/safety;Minimal assistance Lower Body Dressing Details (indicate cue type and reason): nearly able to reach L foot, educated on compensatory strategies and safe footwear, pt not interested in AE  Toilet Transfer: Supervision/safety;RW;Ambulation;BSC (over toilet)   Toileting- Clothing Manipulation and Hygiene: Supervision/safety;Sit to/from Nurse, children'sstand     Tub/Shower Transfer Details (indicate cue type and reason): educated in use of 3 in 1 as shower seat and technique for transfer Functional mobility during ADLs: Supervision/safety;Rolling walker       Vision     Perception     Praxis      Pertinent Vitals/Pain Pain Assessment: Faces Faces Pain Scale: Hurts little more Pain Location: L hip Pain Descriptors / Indicators: Grimacing;Guarding Pain Intervention(s): Repositioned;Ice applied     Hand Dominance Right   Extremity/Trunk Assessment Upper Extremity Assessment Upper Extremity Assessment: Overall WFL for tasks assessed   Lower Extremity Assessment Lower Extremity Assessment: Defer to PT evaluation   Cervical / Trunk Assessment Cervical / Trunk Assessment: Normal   Communication Communication Communication: No difficulties   Cognition Arousal/Alertness: Awake/alert Behavior During Therapy: Impulsive Overall Cognitive Status: Impaired/Different from baseline Area of Impairment: Attention   Current Attention Level: Sustained               General Comments       Exercises       Shoulder Instructions      Home Living Family/patient  expects to be discharged to:: Private residence Living Arrangements: Spouse/significant other Available Help at Discharge: Family;Available PRN/intermittently Type of Home: House Home Access: Stairs to enter [employer] of Steps: 3 Entrance Stairs-Rails: Can reach both Home Layout: One level     Bathroom Shower/Tub: Chief Strategy Officer: Standard     Home Equipment:  Walker - standard;Cane - single point          Prior Functioning/Environment Level of Independence: Independent with assistive device(s)        Comments: pt stated that she would ambulate with use of cane    OT Diagnosis: Generalized weakness;Acute pain   OT Problem List: Decreased strength;Impaired balance (sitting and/or standing);Decreased safety awareness;Decreased knowledge of use of DME or AE;Pain   OT Treatment/Interventions: Self-care/ADL training;DME and/or AE instruction;Patient/family education;Balance training;Therapeutic activities    OT Goals(Current goals can be found in the care plan section) Acute Rehab OT Goals Patient Stated Goal: return home OT Goal Formulation: With patient Time For Goal Achievement: 10/19/15 Potential to Achieve Goals: Good ADL Goals Pt Will Perform Grooming: with modified independence;standing Pt Will Transfer to Toilet: with modified independence;ambulating;bedside commode (over toilet) Pt Will Perform Toileting - Clothing Manipulation and hygiene: with modified independence;sitting/lateral leans Pt Will Perform Tub/Shower Transfer: Tub transfer;with min guard assist;ambulating;3 in 1;rolling walker  OT Frequency: Min 2X/week   Barriers to D/C:            Co-evaluation              End of Session Equipment Utilized During Treatment: Gait belt;Rolling walker  Activity Tolerance: Patient tolerated treatment well Patient left: in bed;with call bell/phone within reach;with family/visitor present;with bed alarm set   Time: 1101-1135 OT Time Calculation (min): 34 min Charges:  OT General Charges $OT Visit: 1 Procedure OT Evaluation $OT Eval Low Complexity: 1 Procedure OT Treatments $Self Care/Home Management : 8-22 mins G-Codes:    Evern Bio 10/12/2015, 11:49 AM  [phone]

## 2015-10-12 NOTE — Progress Notes (Signed)
Physical Therapy Treatment Patient Details Name: Ann KindsBetty L Peters MRN: [id number] DOB: 08/26/1950 Today's Date: 10/12/2015    History of Present Illness pt is a 65 y/o female s/p L THA anterior approach. PMH including but not limited to obesity and extended hx of shingles.    PT Comments    Pt presented supine in bed with HOB elevated, awake and willing to participate in therapy session. Pt making good progress towards achieving her goals. Pt able to ambulate further distance today as compared to previous session. Pt would continue to benefit from skilled physical therapy services at this time while admitted and after d/c to address her limitations in order to improve her overall safety and independence with functional mobility.    Follow Up Recommendations  Home health PT;Supervision for mobility/OOB     Equipment Recommendations  Rolling walker with 5" wheels;3in1 (PT)    Recommendations for Other Services       Precautions / Restrictions Precautions Precautions: Fall Restrictions Weight Bearing Restrictions: Yes LLE Weight Bearing: Weight bearing as tolerated    Mobility  Bed Mobility Overal bed mobility: Needs Assistance Bed Mobility: Sit to Supine     Supine to sit: Min guard;HOB elevated Sit to supine: Min assist   General bed mobility comments: pt required increased time and utilized bed sheet to assist with movements of her L LE  Transfers Overall transfer level: Needs assistance Equipment used: Rolling walker (2 wheeled) Transfers: Sit to/from Stand Sit to Stand: Min guard         General transfer comment: pt required increased time  Ambulation/Gait Ambulation/Gait assistance: Min guard Ambulation Distance (Feet): 15 Feet Assistive device: Rolling walker (2 wheeled) Gait Pattern/deviations: Step-to pattern;Decreased step length - right;Decreased stance time - left;Decreased weight shift to left Gait velocity: decreased Gait velocity interpretation: Below  normal speed for age/gender     Stairs            Wheelchair Mobility    Modified Rankin (Stroke Patients Only)       Balance Overall balance assessment: Needs assistance Sitting-balance support: Feet supported;No upper extremity supported Sitting balance-Leahy Scale: Good     Standing balance support: During functional activity;Bilateral upper extremity supported Standing balance-Leahy Scale: Poor                      Cognition Arousal/Alertness: Awake/alert Behavior During Therapy: WFL for tasks assessed/performed Overall Cognitive Status: Within Functional Limits for tasks assessed Area of Impairment: Attention   Current Attention Level: Sustained                Exercises Total Joint Exercises Quad Sets: AROM;Strengthening;Left;10 reps;Seated Heel Slides: AAROM;Left;5 reps;Seated Long Arc Quad: AROM;Strengthening;Left;10 reps;Seated Marching in Standing: AROM;Strengthening;Left;10 reps;Seated    General Comments        Pertinent Vitals/Pain Pain Assessment: No/denies pain Faces Pain Scale: Hurts little more Pain Location: L hip Pain Descriptors / Indicators: Grimacing;Guarding Pain Intervention(s): Monitored during session    Home Living Family/patient expects to be discharged to:: Private residence Living Arrangements: Spouse/significant other Available Help at Discharge: Family;Available PRN/intermittently Type of Home: House Home Access: Stairs to enter Entrance Stairs-Rails: Can reach both Home Layout: One level Home Equipment: Walker - standard;Cane - single point      Prior Function Level of Independence: Independent with assistive device(s)      Comments: pt stated that she would ambulate with use of cane   PT Goals (current goals can now be found in the care plan  section) Acute Rehab PT Goals Patient Stated Goal: return home PT Goal Formulation: With patient/family Time For Goal Achievement: 10/18/15 Potential to  Achieve Goals: Good Progress towards PT goals: Progressing toward goals    Frequency  7X/week    PT Plan Current plan remains appropriate    Co-evaluation             End of Session Equipment Utilized During Treatment: Gait belt Activity Tolerance: Patient limited by fatigue;Patient limited by pain Patient left: in chair;with call bell/phone within reach     Time: 1025-1040 PT Time Calculation (min) (ACUTE ONLY): 15 min  Charges:  $Gait Training: 8-22 mins                    G CodesAlessandra Bevels [name] 10/13/2015, 12:28 PM Deborah Chalk, PT, DPT [phone]

## 2015-10-12 NOTE — Progress Notes (Signed)
Subjective: 2 Days Post-Op Procedure(s) (LRB): LEFT TOTAL HIP ARTHROPLASTY ANTERIOR APPROACH (Left) Patient reports pain as mild.   Ambulating well with PT. Objective: Vital signs in last 24 hours: Temp:  [98.2 F (36.8 C)-98.6 F (37 C)] 98.6 F (37 C) (08/24 0451) Pulse Rate:  [86-88] 86 (08/24 0451) Resp:  [20] 20 (08/24 0451) BP: (126-128)/(51-76) 126/51 (08/24 0451) SpO2:  [92 %-100 %] 100 % (08/24 0451)  Intake/Output from previous day: 08/23 0701 - 08/24 0700 In: -  Out: 350 [Urine:350] Intake/Output this shift: Total I/O In: 240 [P.O.:240] Out: -    Recent Labs  10/11/15 0344  HGB 10.8*    Recent Labs  10/11/15 0344  WBC 7.2  RBC 3.42*  HCT 33.1*  PLT 302    Recent Labs  10/11/15 0344  NA 136  K 3.9  CL 106  CO2 23  BUN 14  CREATININE 1.01*  GLUCOSE 144*  CALCIUM 9.1   No results for input(s): LABPT, INR in the last 72 hours.  Ambulating well with PT using a walker.   Assessment/Plan: 2 Days Post-Op Procedure(s) (LRB): LEFT TOTAL HIP ARTHROPLASTY ANTERIOR APPROACH (Left) Discharge home with home health tomorrow.   GILBERT CLARK 10/12/2015, 11:24 AM

## 2015-10-12 NOTE — Progress Notes (Signed)
Physical Therapy Treatment Patient Details Name: Ann KindsBetty L Peters MRN: [id number] DOB: 01/30/1951 Today's Date: 10/12/2015    History of Present Illness pt is a 65 y/o female s/p L THA anterior approach. PMH including but not limited to obesity and extended hx of shingles.    PT Comments    Pt presented supine in bed with HOB elevated, initially asleep and lethargic throughout session. Pt also more impulsive this session; standing from bed prematurely without RW in place or gait belt donned. Pt too lethargic and unsafe to perform stair training during this session. However, pt stated that she will be d/c'd home tomorrow. PT plan to stair train at next visit. Pt would continue to benefit from skilled physical therapy services at this time while admitted and after d/c to address her limitations in order to improve her overall safety and independence with functional mobility.    Follow Up Recommendations  Home health PT;Supervision for mobility/OOB     Equipment Recommendations  Rolling walker with 5" wheels;3in1 (PT)    Recommendations for Other Services       Precautions / Restrictions Precautions Precautions: Fall Restrictions Weight Bearing Restrictions: Yes LLE Weight Bearing: Weight bearing as tolerated    Mobility  Bed Mobility Overal bed mobility: Needs Assistance Bed Mobility: Supine to Sit     Supine to sit: Supervision;HOB elevated     General bed mobility comments: pt required increased time and utilized bed rails  Transfers Overall transfer level: Needs assistance Equipment used: Rolling walker (2 wheeled) Transfers: Sit to/from Stand Sit to Stand: Supervision         General transfer comment: pt required increased time, impulsive with transfer  Ambulation/Gait Ambulation/Gait assistance: Min guard Ambulation Distance (Feet): 40 Feet Assistive device: Rolling walker (2 wheeled) Gait Pattern/deviations: Step-to pattern;Decreased step length -  right;Decreased stance time - left;Decreased weight shift to left Gait velocity: decreased Gait velocity interpretation: Below normal speed for age/gender     Stairs            Wheelchair Mobility    Modified Rankin (Stroke Patients Only)       Balance Overall balance assessment: Needs assistance Sitting-balance support: Feet supported;No upper extremity supported Sitting balance-Leahy Scale: Good     Standing balance support: During functional activity;Bilateral upper extremity supported Standing balance-Leahy Scale: Poor                      Cognition Arousal/Alertness: Lethargic;Suspect due to medications Behavior During Therapy: Impulsive Overall Cognitive Status: Impaired/Different from baseline Area of Impairment: Attention   Current Attention Level: Alternating                Exercises Total Joint Exercises Ankle Circles/Pumps: AROM;Both;10 reps;Seated Quad Sets: AROM;Strengthening;Left;10 reps;Seated Heel Slides: AAROM;Left;10 reps;Seated Hip ABduction/ADduction: AROM;Strengthening;Left;10 reps;Seated Straight Leg Raises: AAROM;Left;10 reps;Seated Long Arc Quad: AROM;Strengthening;Left;10 reps;Seated Marching in Standing: AROM;Strengthening;Left;10 reps;Seated    General Comments        Pertinent Vitals/Pain Pain Assessment: No/denies pain Pain Intervention(s): Monitored during session    Home Living                      Prior Function            PT Goals (current goals can now be found in the care plan section) Acute Rehab PT Goals Patient Stated Goal: return home PT Goal Formulation: With patient/family Time For Goal Achievement: 10/18/15 Potential to Achieve Goals: Good Progress towards PT goals: Progressing toward  goals    Frequency  7X/week    PT Plan Current plan remains appropriate    Co-evaluation             End of Session Equipment Utilized During Treatment: Gait belt Activity Tolerance:  Patient limited by lethargy Patient left: in chair;with call bell/phone within reach     Time: 1450-1507 PT Time Calculation (min) (ACUTE ONLY): 17 min  Charges:  $Gait Training: 8-22 mins                    G CodesAlessandra Bevels:      Dayna Geurts M Nashalie Sallis 10/12/2015, 3:24 PM Deborah ChalkJennifer Claudy Abdallah, PT, DPT [phone]

## 2015-10-13 MED ORDER — ZOLPIDEM TARTRATE 5 MG PO TABS: 5.0000 mg | ORAL_TABLET | Freq: Every evening | ORAL | 0 refills | Status: DC | PRN

## 2015-10-13 MED ORDER — ASPIRIN 325 MG PO TBEC: 325.0000 mg | DELAYED_RELEASE_TABLET | Freq: Two times a day (BID) | ORAL | 0 refills | Status: DC

## 2015-10-13 MED ORDER — OXYCODONE-ACETAMINOPHEN 5-325 MG PO TABS: 1.0000 | ORAL_TABLET | ORAL | 0 refills | Status: DC | PRN

## 2015-10-13 MED ORDER — METHOCARBAMOL 500 MG PO TABS
500.0000 mg | ORAL_TABLET | Freq: Four times a day (QID) | ORAL | 0 refills | Status: DC | PRN
Start: 2015-10-13 — End: 2016-01-22

## 2015-10-13 NOTE — Progress Notes (Signed)
Patient ID: Ann Peters, female   DOB: 03/15/1950, 65 y.o.   MRN: [id number] Doing well.  Can be discharged to home today.

## 2015-10-13 NOTE — Discharge Summary (Signed)
Patient ID: Ann Peters MRN: [id number] DOB/AGE: October 03, 1950 65 y.o.  Admit date: 10/10/2015 Discharge date: 10/13/2015  Admission Diagnoses:  Principal Problem:   Osteoarthritis of left hip Active Problems:   Status post left hip replacement   Discharge Diagnoses:  Same  Past Medical History:  Diagnosis Date  . Anxiety   . Depression   . History of shingles    ON AND OFF 6 YEARS  . Neuromuscular disorder (HCC)    POSTHERPETIC NEURALGIA  . Pneumonia    HX   SEVERAL TIMES  NONE IN 3 YRS    Surgeries: Procedure(s): LEFT TOTAL HIP ARTHROPLASTY ANTERIOR APPROACH on 10/10/2015   Consultants:   Discharged Condition: Improved  Hospital Course: Ann Peters is an 65 y.o. female who was admitted 10/10/2015 for operative treatment ofOsteoarthritis of left hip. Patient has severe unremitting pain that affects sleep, daily activities, and work/hobbies. After pre-op clearance the patient was taken to the operating room on 10/10/2015 and underwent  Procedure(s): LEFT TOTAL HIP ARTHROPLASTY ANTERIOR APPROACH.    Patient was given perioperative antibiotics: Anti-infectives    Start     Dose/Rate Route Frequency Ordered Stop   10/10/15 1800  clindamycin (CLEOCIN) IVPB 600 mg     600 mg 100 mL/hr over 30 Minutes Intravenous Every 6 hours 10/10/15 1705 10/11/15 0106   10/10/15 1715  valACYclovir (VALTREX) tablet 1,000 mg     1,000 mg Oral Daily 10/10/15 1705     10/10/15 1130  clindamycin (CLEOCIN) IVPB 900 mg     900 mg 100 mL/hr over 30 Minutes Intravenous To [facility] 10/09/15 2038 10/10/15 1250       Patient was given sequential compression devices, early ambulation, and chemoprophylaxis to prevent DVT.  Patient benefited maximally from hospital stay and there were no complications.    Recent vital signs: Patient Vitals for the past 24 hrs:  BP Temp Temp src Pulse Resp SpO2  10/13/15 0510 121/62 98.6 F (37 C) Oral 78 (!) 22 94 %  10/12/15 2120 (!) 146/63 97.8 F  (36.6 C) Oral 86 18 100 %  10/12/15 1300 (!) 114/57 98.2 F (36.8 C) Oral 82 20 95 %     Recent laboratory studies:  Recent Labs  10/11/15 0344  WBC 7.2  HGB 10.8*  HCT 33.1*  PLT 302  NA 136  K 3.9  CL 106  CO2 23  BUN 14  CREATININE 1.01*  GLUCOSE 144*  CALCIUM 9.1     Discharge Medications:     Medication List    STOP taking these medications   diphenhydrAMINE 25 MG tablet Commonly known as:  BENADRYL   Melatonin 5 MG Tabs     TAKE these medications   APPLE CIDER VINEGAR PO Take 30 mLs by mouth daily.   aspirin 325 MG EC tablet Take 1 tablet (325 mg total) by mouth 2 (two) times daily after a meal.   b complex vitamins tablet Take 1 tablet by mouth daily.   DULoxetine 60 MG capsule Commonly known as:  CYMBALTA Take 1 capsule (60 mg total) by mouth daily.   fluticasone 50 MCG/ACT nasal spray Commonly known as:  FLONASE Place 1 spray into both nostrils daily.   L-LYSINE PO Take 1 tablet by mouth daily.   magnesium oxide 400 MG tablet Commonly known as:  MAG-OX Take 400 mg by mouth daily.   methocarbamol 500 MG tablet Commonly known as:  ROBAXIN Take 1 tablet (500 mg total) by mouth every 6 (  six) hours as needed for muscle spasms.   oxyCODONE-acetaminophen 5-325 MG tablet Commonly known as:  ROXICET Take 1-2 tablets by mouth every 4 (four) hours as needed.   valACYclovir 1000 MG tablet Commonly known as:  VALTREX Take 1 tablet (1,000 mg total) by mouth daily.   vitamin B-12 1000 MCG tablet Commonly known as:  CYANOCOBALAMIN Take 1 tablet (1,000 mcg total) by mouth daily.   vitamin C 1000 MG tablet Take 500 mg by mouth 2 (two) times daily.   Vitamin D3 5000 units Caps Take 1 capsule (5,000 Units total) by mouth daily.   zolpidem 5 MG tablet Commonly known as:  AMBIEN Take 1 tablet (5 mg total) by mouth at bedtime as needed for sleep.       Diagnostic Studies: Dg C-arm 1-60 Min  Result Date: 10/10/2015 CLINICAL DATA:  Left hip  arthroplasty EXAM: OPERATIVE LEFT HIP WITH PELVIS; DG C-ARM 61-120 MIN COMPARISON:  None. FLUOROSCOPY TIME:  Radiation Exposure Index (as provided by the fluoroscopic device): Not available If the device does not provide the exposure index: Fluoroscopy Time:  41 seconds Number of Acquired Images:  4 FINDINGS: Initial images demonstrate significant degenerative changes of the left hip. Final 2 images show evidence of a left total hip prosthesis in satisfactory position. No acute bony or soft tissue abnormality is noted. IMPRESSION: Status post left hip replacement Electronically Signed   By: Alcide CleverMark  Lukens M.D.   On: 10/10/2015 14:57   Dg Hip Port Unilat With Pelvis 1v Left  Result Date: 10/10/2015 CLINICAL DATA:  Left total hip replacement ; history of osteoarthritis. EXAM: DG HIP (WITH OR WITHOUT PELVIS) 1V PORT LEFT COMPARISON:  Intraoperative fluoro spot images of today's date FINDINGS: The patient has undergone left total hip joint replacement. Radiographic positioning of the prosthetic components is good. The native bone appears intact. IMPRESSION: There is no evidence of postprocedure complication following left total hip joint replacement. Electronically Signed   By: David  SwazilandJordan M.D.   On: 10/10/2015 15:42   Dg Hip Operative Unilat W Or W/o Pelvis Left  Result Date: 10/10/2015 CLINICAL DATA:  Left hip arthroplasty EXAM: OPERATIVE LEFT HIP WITH PELVIS; DG C-ARM 61-120 MIN COMPARISON:  None. FLUOROSCOPY TIME:  Radiation Exposure Index (as provided by the fluoroscopic device): Not available If the device does not provide the exposure index: Fluoroscopy Time:  41 seconds Number of Acquired Images:  4 FINDINGS: Initial images demonstrate significant degenerative changes of the left hip. Final 2 images show evidence of a left total hip prosthesis in satisfactory position. No acute bony or soft tissue abnormality is noted. IMPRESSION: Status post left hip replacement Electronically Signed   By: Alcide CleverMark  Lukens  M.D.   On: 10/10/2015 14:57    Disposition: 01-Home or Self Care  Discharge Instructions    Call MD / Call 911    Complete by:  As directed   If you experience chest pain or shortness of breath, CALL 911 and be transported to the hospital emergency room.  If you develope a fever above 101 F, pus (white drainage) or increased drainage or redness at the wound, or calf pain, call your surgeon's office.   Constipation Prevention    Complete by:  As directed   Drink plenty of fluids.  Prune juice may be helpful.  You may use a stool softener, such as Colace (over the counter) 100 mg twice a day.  Use MiraLax (over the counter) for constipation as needed.   Diet -  low sodium heart healthy    Complete by:  As directed   Discharge patient    Complete by:  As directed   Increase activity slowly as tolerated    Complete by:  As directed      Follow-up Information    [facility] .   Why:  Someone from [employer] information: [address] [place] [address] [phone]        Kathryne Hitch, MD Follow up in 2 week(s).   Specialty:  Orthopedic Surgery Contact information: [address] [place] [address] [phone]            Signed: Kathryne Hitch 10/13/2015, 7:05 AM

## 2015-10-13 NOTE — Progress Notes (Signed)
Occupational Therapy Treatment Patient Details Name: Ann Peters MRN: [id number] DOB: 12-31-1950 Today's Date: 10/13/2015    History of present illness pt is a 65 y/o female s/p L THA anterior approach. PMH including but not limited to obesity and extended hx of shingles.   OT comments  Pt. Is at MOD I level for UB/LB ADLS. S for tub transfer eager for d/c home. Clear for d/c from OT.  Will alert OTR/L to sign off.   Follow Up Recommendations  No OT follow up    Equipment Recommendations  3 in 1 bedside comode    Recommendations for Other Services      Precautions / Restrictions Precautions Precautions: Fall Restrictions LLE Weight Bearing: Weight bearing as tolerated       Mobility Bed Mobility Overal bed mobility: Modified Independent Bed Mobility: Supine to Sit     Supine to sit: Modified independent (Device/Increase time)     General bed mobility comments: pts HOB adjusted to height to mimic her "wedge pillows" at home, no rails not trapeze use. no physical assistanc required to exit from R side  Transfers Overall transfer level: Modified independent Equipment used: Rolling walker (2 wheeled) Transfers: Sit to/from [facility] Sit to Stand: Modified independent (Device/Increase time) Stand pivot transfers: Modified independent (Device/Increase time)            Balance                                   ADL Overall ADL's : Modified independent                 Upper Body Dressing : Set up;Sitting   Lower Body Dressing: Modified independent;Sit to/from stand           Tub/ Banker: Supervision/safety;Grab bars;3 in 1 Clinical cytogeneticist Details (indicate cue type and reason): able to return demo of side step into tub with R faucet Functional mobility during ADLs: Modified independent General ADL Comments: moves well, extremely determined and wants to get home!!! cues for slowing pace and not "plopping" into  furniture when transitioning into sitting      Vision                     Perception     Praxis      Cognition   Behavior During Therapy: [facility] for tasks assessed/performed Overall Cognitive Status: Within Functional Limits for tasks assessed                       Extremity/Trunk Assessment               Exercises     Shoulder Instructions       General Comments      Pertinent Vitals/ Pain       Pain Assessment: No/denies pain  Home Living                                          Prior Functioning/Environment              Frequency Min 2X/week     Progress Toward Goals  OT Goals(current goals can now be found in the care plan section)  Progress towards OT goals: Goals met/education completed, patient discharged from OT  Plan Discharge plan remains appropriate    Co-evaluation                 End of Session Equipment Utilized During Treatment: Gait belt;Rolling walker   Activity Tolerance Patient tolerated treatment well   Patient Left in chair;with call bell/phone within reach   Nurse Communication          Time: 4884-5733 OT Time Calculation (min): 26 min  Charges: OT General Charges $OT Visit: 1 Procedure OT Treatments $Self Care/Home Management : 23-37 mins  Janice Coffin, COTA/L 10/13/2015, 8:06 AM

## 2015-10-13 NOTE — Progress Notes (Signed)
Physical Therapy Treatment Patient Details Name: Ann Peters MRN: [id number] DOB: October 01, 1950 Today's Date: 10/13/2015    History of Present Illness pt is a 65 y/o female s/p L THA anterior approach. PMH including but not limited to obesity and extended hx of shingles.    PT Comments    Pt presented sitting OOB in recliner when PT entered room. Pt was awake and willing to participate in therapy session. Pt successfully completed stair training during session. Pt would continue to benefit from skilled physical therapy services at this time while admitted and after d/c to address her limitations in order to improve her overall safety and independence with functional mobility.   Follow Up Recommendations  Home health PT;Supervision for mobility/OOB     Equipment Recommendations  Rolling walker with 5" wheels;3in1 (PT)    Recommendations for Other Services       Precautions / Restrictions Precautions Precautions: Fall Restrictions Weight Bearing Restrictions: Yes LLE Weight Bearing: Weight bearing as tolerated    Mobility  Bed Mobility Overal bed mobility: Modified Independent Bed Mobility: Supine to Sit     Supine to sit: Modified independent (Device/Increase time)     General bed mobility comments: pt sitting OOB in recliner when PT entered  Transfers Overall transfer level: Needs assistance Equipment used: Rolling walker (2 wheeled) Transfers: Sit to/from Stand Sit to Stand: Supervision Stand pivot transfers: Modified independent (Device/Increase time)          Ambulation/Gait Ambulation/Gait assistance: Min guard Ambulation Distance (Feet): 150 Feet Assistive device: Rolling walker (2 wheeled) Gait Pattern/deviations: Step-to pattern;Decreased step length - right;Decreased stance time - left;Decreased weight shift to left Gait velocity: decreased Gait velocity interpretation: Below normal speed for age/gender     Stairs Stairs: Yes Stairs assistance:  Min guard Stair Management: One rail Right;Step to pattern;Forwards Number of Stairs: 2 General stair comments: pt instructed to ascend with R LE leading and descend with L LE leading  Wheelchair Mobility    Modified Rankin (Stroke Patients Only)       Balance Overall balance assessment: Needs assistance Sitting-balance support: Feet supported;No upper extremity supported Sitting balance-Leahy Scale: Good     Standing balance support: During functional activity;Bilateral upper extremity supported Standing balance-Leahy Scale: Poor                      Cognition Arousal/Alertness: Awake/alert Behavior During Therapy: WFL for tasks assessed/performed Overall Cognitive Status: Within Functional Limits for tasks assessed                      Exercises      General Comments        Pertinent Vitals/Pain Pain Assessment: No/denies pain Pain Intervention(s): Monitored during session    Home Living                      Prior Function            PT Goals (current goals can now be found in the care plan section) Acute Rehab PT Goals Patient Stated Goal: return home PT Goal Formulation: With patient/family Time For Goal Achievement: 10/18/15 Potential to Achieve Goals: Good Progress towards PT goals: Progressing toward goals    Frequency  7X/week    PT Plan Current plan remains appropriate    Co-evaluation             End of Session Equipment Utilized During Treatment: Gait belt Activity Tolerance: Patient tolerated treatment  well Patient left: in bed;with call bell/phone within reach;with family/visitor present     Time: [id number] PT Time Calculation (min) (ACUTE ONLY): 12 min  Charges:  $Gait Training: 8-22 mins                    G Codes:      Ann BevelsJennifer M Denielle Peters 10/13/2015, 9:45 AM Ann ChalkJennifer Rafe Peters, PT, DPT [phone]

## 2015-10-13 NOTE — Plan of Care (Signed)
Problem: Pain Management: Goal: Pain level will decrease with appropriate interventions Outcome: Progressing Giving Oxyir and Robaxin together at times, and seems to decrease pain.

## 2015-10-13 NOTE — Discharge Instructions (Signed)

## 2015-10-13 NOTE — Progress Notes (Signed)
This RN provide discharge instructions to pt and provided her paper prescriptions to go. And all questions answered. Pt is ready to go.

## 2015-10-15 DIAGNOSIS — F329 Major depressive disorder, single episode, unspecified: Secondary | ICD-10-CM | POA: Diagnosis not present

## 2015-10-15 DIAGNOSIS — M797 Fibromyalgia: Secondary | ICD-10-CM | POA: Diagnosis not present

## 2015-10-15 DIAGNOSIS — R296 Repeated falls: Secondary | ICD-10-CM | POA: Diagnosis not present

## 2015-10-15 DIAGNOSIS — Z471 Aftercare following joint replacement surgery: Secondary | ICD-10-CM | POA: Diagnosis not present

## 2015-10-15 DIAGNOSIS — Z96642 Presence of left artificial hip joint: Secondary | ICD-10-CM | POA: Diagnosis not present

## 2015-10-17 DIAGNOSIS — Z471 Aftercare following joint replacement surgery: Secondary | ICD-10-CM | POA: Diagnosis not present

## 2015-10-17 DIAGNOSIS — M797 Fibromyalgia: Secondary | ICD-10-CM | POA: Diagnosis not present

## 2015-10-17 DIAGNOSIS — F329 Major depressive disorder, single episode, unspecified: Secondary | ICD-10-CM | POA: Diagnosis not present

## 2015-10-17 DIAGNOSIS — Z96642 Presence of left artificial hip joint: Secondary | ICD-10-CM | POA: Diagnosis not present

## 2015-10-17 DIAGNOSIS — R296 Repeated falls: Secondary | ICD-10-CM | POA: Diagnosis not present

## 2015-10-20 DIAGNOSIS — R296 Repeated falls: Secondary | ICD-10-CM | POA: Diagnosis not present

## 2015-10-20 DIAGNOSIS — M797 Fibromyalgia: Secondary | ICD-10-CM | POA: Diagnosis not present

## 2015-10-20 DIAGNOSIS — F329 Major depressive disorder, single episode, unspecified: Secondary | ICD-10-CM | POA: Diagnosis not present

## 2015-10-20 DIAGNOSIS — Z471 Aftercare following joint replacement surgery: Secondary | ICD-10-CM | POA: Diagnosis not present

## 2015-10-20 DIAGNOSIS — Z96642 Presence of left artificial hip joint: Secondary | ICD-10-CM | POA: Diagnosis not present

## 2015-10-24 DIAGNOSIS — M25552 Pain in left hip: Secondary | ICD-10-CM | POA: Diagnosis not present

## 2015-10-24 DIAGNOSIS — F329 Major depressive disorder, single episode, unspecified: Secondary | ICD-10-CM | POA: Diagnosis not present

## 2015-10-24 DIAGNOSIS — Z96642 Presence of left artificial hip joint: Secondary | ICD-10-CM | POA: Diagnosis not present

## 2015-10-24 DIAGNOSIS — M797 Fibromyalgia: Secondary | ICD-10-CM | POA: Diagnosis not present

## 2015-10-24 DIAGNOSIS — Z471 Aftercare following joint replacement surgery: Secondary | ICD-10-CM | POA: Diagnosis not present

## 2015-10-24 DIAGNOSIS — M1612 Unilateral primary osteoarthritis, left hip: Secondary | ICD-10-CM | POA: Diagnosis not present

## 2015-10-24 DIAGNOSIS — R296 Repeated falls: Secondary | ICD-10-CM | POA: Diagnosis not present

## 2015-10-25 DIAGNOSIS — Z96642 Presence of left artificial hip joint: Secondary | ICD-10-CM | POA: Diagnosis not present

## 2015-10-25 DIAGNOSIS — M797 Fibromyalgia: Secondary | ICD-10-CM | POA: Diagnosis not present

## 2015-10-25 DIAGNOSIS — F329 Major depressive disorder, single episode, unspecified: Secondary | ICD-10-CM | POA: Diagnosis not present

## 2015-10-25 DIAGNOSIS — R296 Repeated falls: Secondary | ICD-10-CM | POA: Diagnosis not present

## 2015-10-25 DIAGNOSIS — Z471 Aftercare following joint replacement surgery: Secondary | ICD-10-CM | POA: Diagnosis not present

## 2015-10-27 DIAGNOSIS — Z471 Aftercare following joint replacement surgery: Secondary | ICD-10-CM | POA: Diagnosis not present

## 2015-10-27 DIAGNOSIS — F329 Major depressive disorder, single episode, unspecified: Secondary | ICD-10-CM | POA: Diagnosis not present

## 2015-10-27 DIAGNOSIS — Z96642 Presence of left artificial hip joint: Secondary | ICD-10-CM | POA: Diagnosis not present

## 2015-10-27 DIAGNOSIS — M797 Fibromyalgia: Secondary | ICD-10-CM | POA: Diagnosis not present

## 2015-10-27 DIAGNOSIS — R296 Repeated falls: Secondary | ICD-10-CM | POA: Diagnosis not present

## 2015-11-10 ENCOUNTER — Ambulatory Visit: Payer: Self-pay | Admitting: Family Medicine

## 2015-11-22 ENCOUNTER — Ambulatory Visit (INDEPENDENT_AMBULATORY_CARE_PROVIDER_SITE_OTHER): Payer: Medicare Other | Admitting: Orthopaedic Surgery

## 2015-11-22 DIAGNOSIS — M1612 Unilateral primary osteoarthritis, left hip: Secondary | ICD-10-CM

## 2015-11-22 DIAGNOSIS — M25552 Pain in left hip: Secondary | ICD-10-CM

## 2016-01-22 ENCOUNTER — Ambulatory Visit (INDEPENDENT_AMBULATORY_CARE_PROVIDER_SITE_OTHER): Payer: Medicare Other | Admitting: Family Medicine

## 2016-01-22 ENCOUNTER — Encounter: Payer: Self-pay | Admitting: Family Medicine

## 2016-01-22 VITALS — BP 126/84 | HR 90 | Resp 16 | Ht 68.5 in | Wt 253.2 lb

## 2016-01-22 DIAGNOSIS — E538 Deficiency of other specified B group vitamins: Secondary | ICD-10-CM

## 2016-01-22 DIAGNOSIS — E669 Obesity, unspecified: Secondary | ICD-10-CM | POA: Diagnosis not present

## 2016-01-22 DIAGNOSIS — M797 Fibromyalgia: Secondary | ICD-10-CM

## 2016-01-22 DIAGNOSIS — R7303 Prediabetes: Secondary | ICD-10-CM

## 2016-01-22 DIAGNOSIS — E66812 Obesity, class 2: Secondary | ICD-10-CM

## 2016-01-22 DIAGNOSIS — Z96642 Presence of left artificial hip joint: Secondary | ICD-10-CM | POA: Diagnosis not present

## 2016-01-22 DIAGNOSIS — Z1239 Encounter for other screening for malignant neoplasm of breast: Secondary | ICD-10-CM

## 2016-01-22 DIAGNOSIS — E559 Vitamin D deficiency, unspecified: Secondary | ICD-10-CM

## 2016-01-22 DIAGNOSIS — B029 Zoster without complications: Secondary | ICD-10-CM | POA: Diagnosis not present

## 2016-01-22 DIAGNOSIS — Z23 Encounter for immunization: Secondary | ICD-10-CM | POA: Diagnosis not present

## 2016-01-22 DIAGNOSIS — Z1231 Encounter for screening mammogram for malignant neoplasm of breast: Secondary | ICD-10-CM

## 2016-01-23 MED ORDER — DULOXETINE HCL 60 MG PO CPEP: 120.0000 mg | ORAL_CAPSULE | Freq: Every day | ORAL | 3 refills | Status: DC

## 2016-01-23 NOTE — Progress Notes (Signed)
Date:  01/22/2016   Name:  Ann Peters   DOB:  04/21/1950   MRN:  [id number]  PCP:  Schuyler AmorWilliam Devone Tousley, MD    Chief Complaint: No chief complaint on file.   History of Present Illness:  This is a 65 y.o. female for nine month f/u. Had L hip replacement in August, tolerated well. Stopped Valtrex after surgery but now back on. Increased Cymbalta to 120 mg daily due to increased fibromyalgia sxs. Losing weight on apple cider vinegar. Needs flu imm and mammogram. A1c, vit D, B12 ok in March.  Review of Systems:  Review of Systems  Constitutional: Negative for fever.  Respiratory: Negative for shortness of breath.   Cardiovascular: Negative for chest pain and leg swelling.  Endocrine: Negative for polyuria.  Neurological: Negative for syncope.  Psychiatric/Behavioral: Negative for confusion.    Patient Active Problem List   Diagnosis Date Noted  . Osteoarthritis of left hip 10/10/2015  . Status post left hip replacement 10/10/2015  . Onychomycosis of left great toe 05/10/2015  . Prediabetes 01/03/2015  . Diverticulitis 09/01/2014  . Vitamin D deficiency 09/01/2014  . Herpes zoster 09/01/2014  . FH: diabetes mellitus 09/01/2014  . FHx: heart disease 09/01/2014  . FH: lymphoma 09/01/2014  . Reactive cervical lymphadenopathy 09/01/2014  . Obesity, Class II, BMI 35-39.9, no comorbidity 09/01/2014  . Clinical depression 11/15/2013  . Fibromyalgia 11/15/2013  . Vitamin B12 deficiency 12/23/2011  . Allergic rhinitis 04/18/2011    Prior to Admission medications   Medication Sig Start Date End Date Taking? Authorizing Provider  APPLE CIDER VINEGAR PO Take 30 mLs by mouth daily.   Yes Historical Provider, MD  Ascorbic Acid (VITAMIN C) 1000 MG tablet Take 500 mg by mouth 2 (two) times daily.   Yes Historical Provider, MD  b complex vitamins tablet Take 1 tablet by mouth daily.   Yes Historical Provider, MD  Cholecalciferol (VITAMIN D3) 5000 UNITS CAPS Take 1 capsule (5,000 Units total) by  mouth daily. 09/01/14  Yes Schuyler AmorWilliam Tarry Blayney, MD  DULoxetine (CYMBALTA) 60 MG capsule Take 1 capsule (60 mg total) by mouth daily. 05/08/15 05/07/16 Yes Schuyler AmorWilliam Ringo Sherod, MD  fluticasone (FLONASE) 50 MCG/ACT nasal spray Place 1 spray into both nostrils daily.   Yes Historical Provider, MD  magnesium oxide (MAG-OX) 400 MG tablet Take 400 mg by mouth daily.   Yes Historical Provider, MD  valACYclovir (VALTREX) 1000 MG tablet Take 1 tablet (1,000 mg total) by mouth daily. 04/25/15  Yes Schuyler AmorWilliam Tiamarie Furnari, MD  vitamin B-12 (CYANOCOBALAMIN) 1000 MCG tablet Take 1 tablet (1,000 mcg total) by mouth daily. 09/01/14  Yes Schuyler AmorWilliam Lynnzie Blackson, MD    Allergies  Allergen Reactions  . Amoxicillin-Pot Clavulanate Other (See Comments)    UNSPECIFIED   . Penicillins Nausea And Vomiting    Has patient had a PCN reaction causing immediate rash, facial/tongue/throat swelling, SOB or lightheadedness with hypotension: no Has patient had a PCN reaction causing severe rash involving mucus membranes or skin necrosis: No Has patient had a PCN reaction that required hospitalization No Has patient had a PCN reaction occurring within the last 10 years: Yes If all of the above answers are "NO", then may proceed with Cephalosporin use.     Past Surgical History:  Procedure Laterality Date  . BLADDER REPAIR    . COLON SURGERY     part of colon removed  . COLONOSCOPY  2011   polyp removed- Dr Marva PandaSkulskie  . NASAL SINUS SURGERY    . TONSILLECTOMY    .  TOTAL HIP ARTHROPLASTY Left 10/10/2015   Procedure: LEFT TOTAL HIP ARTHROPLASTY ANTERIOR APPROACH;  Surgeon: Kathryne Hitchhristopher Y Blackman, MD;  Location: [facility] OR;  Service: Orthopedics;  Laterality: Left;  . TUBAL LIGATION    . VAGINAL HYSTERECTOMY      Social History  Substance Use Topics  . Smoking status: Never Smoker  . Smokeless tobacco: Never Used  . Alcohol use No    Family History  Problem Relation Age of Onset  . Cancer Mother   . Lymphoma Mother     Medication list has been  reviewed and updated.  Physical Examination: BP 126/84   Pulse 90   Resp 16   Ht 5' 8.5" (1.74 m)   Wt 253 lb 3.2 oz (114.9 kg)   SpO2 96%   BMI 37.94 kg/m   Physical Exam  Constitutional: She appears well-developed and well-nourished.  Cardiovascular: Normal rate, regular rhythm and normal heart sounds.   Pulmonary/Chest: Effort normal and breath sounds normal.  Musculoskeletal: She exhibits no edema.  Neurological: She is alert.  Skin: Skin is warm and dry.  Psychiatric: She has a normal mood and affect. Her behavior is normal.  Nursing note and vitals reviewed.   Assessment and Plan:  1. Fibromyalgia Improved control on increased Cymbalta  2. Vitamin B12 deficiency Well controlled on supplement  3. Vitamin D deficiency Well controlled on supplement  4. Herpes zoster without complication Cont Valtrex daily  5. Obesity, Class II, BMI 35-39.9, no comorbidity Weight down 20# since March, may continue apple cider vinegar  6. Prediabetes A1c well controlled in March  7. Status post left hip replacement  8. Needs flu shot - Flu Vaccine QUAD 36+ mos PF IM (Fluarix & Fluzone Quad PF)  9. Breast cancer screening - MM Digital Screening; Future  10. Med review May substitute MVI for vit C, B complex, and mg supp   Return in about 6 months (around 07/22/2016).  [name] [name], Jr. MD [facility]  01/23/2016

## 2016-02-02 ENCOUNTER — Ambulatory Visit
Admission: RE | Admit: 2016-02-02 | Discharge: 2016-02-02 | Disposition: A | Discharge status: Home / Self Care | Payer: Medicare Other | Source: Ambulatory Visit | Attending: Family Medicine | Admitting: Family Medicine

## 2016-02-02 DIAGNOSIS — Z1231 Encounter for screening mammogram for malignant neoplasm of breast: Secondary | ICD-10-CM | POA: Diagnosis not present

## 2016-02-02 DIAGNOSIS — Z1239 Encounter for other screening for malignant neoplasm of breast: Secondary | ICD-10-CM

## 2016-04-17 ENCOUNTER — Ambulatory Visit (INDEPENDENT_AMBULATORY_CARE_PROVIDER_SITE_OTHER): Payer: Medicare Other | Admitting: Family Medicine

## 2016-04-17 ENCOUNTER — Encounter: Payer: Self-pay | Admitting: Family Medicine

## 2016-04-17 VITALS — BP 128/86 | HR 83 | Resp 16 | Ht 68.6 in | Wt 243.5 lb

## 2016-04-17 DIAGNOSIS — M797 Fibromyalgia: Secondary | ICD-10-CM | POA: Diagnosis not present

## 2016-04-17 DIAGNOSIS — E669 Obesity, unspecified: Secondary | ICD-10-CM

## 2016-04-17 DIAGNOSIS — R7303 Prediabetes: Secondary | ICD-10-CM | POA: Diagnosis not present

## 2016-04-17 DIAGNOSIS — F5102 Adjustment insomnia: Secondary | ICD-10-CM

## 2016-04-17 MED ORDER — TRAZODONE HCL 50 MG PO TABS: 25.0000 mg | ORAL_TABLET | Freq: Every evening | ORAL | 2 refills | Status: DC | PRN
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
## 2016-04-17 NOTE — Patient Instructions (Signed)
Insomnia Insomnia is a sleep disorder that makes it difficult to fall asleep or to stay asleep. Insomnia can cause tiredness (fatigue), low energy, difficulty concentrating, mood swings, and poor performance at work or school. There are three different ways to classify insomnia:  Difficulty falling asleep.  Difficulty staying asleep.  Waking up too early in the morning. Any type of insomnia can be long-term (chronic) or short-term (acute). Both are common. Short-term insomnia usually lasts for three months or less. Chronic insomnia occurs at least three times a week for longer than three months. What are the causes? Insomnia may be caused by another condition, situation, or substance, such as:  Anxiety.  Certain medicines.  Gastroesophageal reflux disease (GERD) or other gastrointestinal conditions.  Asthma or other breathing conditions.  Restless legs syndrome, sleep apnea, or other sleep disorders.  Chronic pain.  Menopause. This may include hot flashes.  Stroke.  Abuse of alcohol, tobacco, or illegal drugs.  Depression.  Caffeine.  Neurological disorders, such as Alzheimer disease.  An overactive thyroid (hyperthyroidism). The cause of insomnia may not be known. What increases the risk? Risk factors for insomnia include:  Gender. Women are more commonly affected than men.  Age. Insomnia is more common as you get older.  Stress. This may involve your professional or personal life.  Income. Insomnia is more common in people with lower income.  Lack of exercise.  Irregular work schedule or night shifts.  Traveling between different time zones. What are the signs or symptoms? If you have insomnia, trouble falling asleep or trouble staying asleep is the main symptom. This may lead to other symptoms, such as:  Feeling fatigued.  Feeling nervous about going to sleep.  Not feeling rested in the morning.  Having trouble concentrating.  Feeling irritable,  anxious, or depressed. How is this treated? Treatment for insomnia depends on the cause. If your insomnia is caused by an underlying condition, treatment will focus on addressing the condition. Treatment may also include:  Medicines to help you sleep.  Counseling or therapy.  Lifestyle adjustments. Follow these instructions at home:  Take medicines only as directed by your health care provider.  Keep regular sleeping and waking hours. Avoid naps.  Keep a sleep diary to help you and your health care provider figure out what could be causing your insomnia. Include:  When you sleep.  When you wake up during the night.  How well you sleep.  How rested you feel the next day.  Any side effects of medicines you are taking.  What you eat and drink.  Make your bedroom a comfortable place where it is easy to fall asleep:  Put up shades or special blackout curtains to block light from outside.  Use a white noise machine to block noise.  Keep the temperature cool.  Exercise regularly as directed by your health care provider. Avoid exercising right before bedtime.  Use relaxation techniques to manage stress. Ask your health care provider to suggest some techniques that may work well for you. These may include:  Breathing exercises.  Routines to release muscle tension.  Visualizing peaceful scenes.  Cut back on alcohol, caffeinated beverages, and cigarettes, especially close to bedtime. These can disrupt your sleep.  Do not overeat or eat spicy foods right before bedtime. This can lead to digestive discomfort that can make it hard for you to sleep.  Limit screen use before bedtime. This includes:  Watching TV.  Using your smartphone, tablet, and computer.  Stick to a   routine. This can help you fall asleep faster. Try to do a quiet activity, brush your teeth, and go to bed at the same time each night.  Get out of bed if you are still awake after 15 minutes of trying to  sleep. Keep the lights down, but try reading or doing a quiet activity. When you feel sleepy, go back to bed.  Make sure that you drive carefully. Avoid driving if you feel very sleepy.  Keep all follow-up appointments as directed by your health care provider. This is important. Contact a health care provider if:  You are tired throughout the day or have trouble in your daily routine due to sleepiness.  You continue to have sleep problems or your sleep problems get worse. Get help right away if:  You have serious thoughts about hurting yourself or someone else. This information is not intended to replace advice given to you by your health care provider. Make sure you discuss any questions you have with your health care provider. Document Released: 02/02/2000 Document Revised: 07/07/2015 Document Reviewed: 11/05/2013 Elsevier Interactive Patient Education  2017 [employer]  

## 2016-05-13 ENCOUNTER — Ambulatory Visit: Payer: Medicare Other

## 2016-05-13 ENCOUNTER — Ambulatory Visit
Admission: EM | Admit: 2016-05-13 | Discharge: 2016-05-13 | Disposition: A | Discharge status: Home / Self Care | Payer: Medicare Other | Attending: Emergency Medicine | Admitting: Emergency Medicine

## 2016-05-13 DIAGNOSIS — Z79899 Other long term (current) drug therapy: Secondary | ICD-10-CM | POA: Diagnosis not present

## 2016-05-13 DIAGNOSIS — R0609 Other forms of dyspnea: Secondary | ICD-10-CM | POA: Diagnosis not present

## 2016-05-13 DIAGNOSIS — J069 Acute upper respiratory infection, unspecified: Secondary | ICD-10-CM | POA: Diagnosis not present

## 2016-05-13 DIAGNOSIS — R079 Chest pain, unspecified: Secondary | ICD-10-CM | POA: Diagnosis not present

## 2016-05-13 DIAGNOSIS — R51 Headache: Secondary | ICD-10-CM | POA: Diagnosis not present

## 2016-05-13 DIAGNOSIS — R069 Unspecified abnormalities of breathing: Secondary | ICD-10-CM | POA: Diagnosis not present

## 2016-05-13 DIAGNOSIS — R0789 Other chest pain: Secondary | ICD-10-CM

## 2016-05-13 DIAGNOSIS — Z880 Allergy status to penicillin: Secondary | ICD-10-CM | POA: Insufficient documentation

## 2016-05-13 MED ORDER — DOXYCYCLINE HYCLATE 100 MG PO CAPS: 100.0000 mg | ORAL_CAPSULE | Freq: Two times a day (BID) | ORAL | 0 refills | Status: AC

## 2016-05-13 MED ORDER — GUAIFENESIN-CODEINE 100-10 MG/5ML PO SYRP: 10.0000 mL | ORAL_SOLUTION | Freq: Four times a day (QID) | ORAL | 0 refills | Status: DC | PRN

## 2016-05-13 MED ORDER — PREDNISONE 20 MG PO TABS: 40.0000 mg | ORAL_TABLET | Freq: Every day | ORAL | 0 refills | Status: AC

## 2016-05-13 MED ORDER — AEROCHAMBER PLUS MISC: 2 refills | Status: DC

## 2016-05-13 MED ORDER — ALBUTEROL SULFATE HFA 108 (90 BASE) MCG/ACT IN AERS: 2.0000 | INHALATION_SPRAY | RESPIRATORY_TRACT | 0 refills | Status: DC | PRN

## 2016-05-13 NOTE — ED Triage Notes (Signed)
Pt c/o difficulty breathing while sleeping. She says she has had walking pneumonia and has been a patient of Dr Yolonda KidaJunegel, she says she is achy also.

## 2016-05-13 NOTE — Discharge Instructions (Signed)
2 puffs from your albuterol inhaler every 4-6 hours as needed for coughing, wheezing, shortness of breath. Continue your Aleve. Take 1 g of Tylenol with this. This is a very effective combination for pain. It will help with your chest pain and with the headache. Start some saline nasal irrigation and continue your Flonase. I am placing you on doxycycline because it will cover both a sinus infection and pneumonia.

## 2016-05-13 NOTE — ED Provider Notes (Signed)
HPI  SUBJECTIVE:  Ann Peters is a 66 y.o. female who presents with URI-like symptoms with nasal congestion, postnasal drip for the past 2-1/2 weeks. She states that it has "moved into my left lung". She reports sharp, pleuritic posterior left lower rib/chest pain for the past week. She states that this pain is waking her up at night. She reports dyspnea on exertion with going upstairs. States this is identical to previous pneumonias. She reports an occasional nonproductive cough, no wheezing. She also reports a dull, achy intermittent hours along right-sided headache for the past week and right sharp intermittent ear pain with occasionally decreased hearing. She has been taking Aleve and Benadryl which has helped her symptoms, symptoms are worse with deep inspiration. She denies fevers, shortness of breath, purulent nasal congestion, rhinorrhea, recent swimming, otorrhea. Ear pain is not associated with chewing or yawning photophobia, neck stiffness. She denies chest rash.   no orthopnea, lower extremity edema, calf pain, swelling, unintentional weight gain, nocturia. No antibiotic in the past month. She took an Aleve within 6-8 hours of evaluation. She states her cough is waking her up at night. She has a past medical history of pneumonias, recurrent shingles, sinusitis status post 3 sinus surgeries. No history of asthma, emphysema, COPD, smoking, diabetes, hypertension, PE, DVT, sarcoidosis, CHF. She has a family history significant for sarcoidosis. PMD: Dr. Hollace Hayward   Past Medical History:  Diagnosis Date  . Anxiety   . Depression   . History of shingles    ON AND OFF 6 YEARS  . Neuromuscular disorder (HCC)    POSTHERPETIC NEURALGIA  . Pneumonia    HX   SEVERAL TIMES  NONE IN 3 YRS    Past Surgical History:  Procedure Laterality Date  . BLADDER REPAIR    . COLON SURGERY     part of colon removed  . COLONOSCOPY  2011   polyp removed- Dr Marva Panda  . NASAL SINUS SURGERY    .  TONSILLECTOMY    . TOTAL HIP ARTHROPLASTY Left 10/10/2015   Procedure: LEFT TOTAL HIP ARTHROPLASTY ANTERIOR APPROACH;  Surgeon: Kathryne Hitch, MD;  Location: [facility] OR;  Service: Orthopedics;  Laterality: Left;  . TUBAL LIGATION    . VAGINAL HYSTERECTOMY      Family History  Problem Relation Age of Onset  . Cancer Mother   . Lymphoma Mother   . Breast cancer Sister 13    Social History  Substance Use Topics  . Smoking status: Never Smoker  . Smokeless tobacco: Never Used  . Alcohol use No    No current facility-administered medications for this encounter.   Current Outpatient Prescriptions:  .  albuterol (PROVENTIL HFA;VENTOLIN HFA) 108 (90 Base) MCG/ACT inhaler, Inhale 2 puffs into the lungs every 4 (four) hours as needed for wheezing or shortness of breath. Dispense with aerochamber, Disp: 1 Inhaler, Rfl: 0 .  APPLE CIDER VINEGAR PO, Take 30 mLs by mouth daily., Disp: , Rfl:  .  Ascorbic Acid (VITAMIN C) 1000 MG tablet, Take 500 mg by mouth 2 (two) times daily., Disp: , Rfl:  .  b complex vitamins tablet, Take 1 tablet by mouth daily., Disp: , Rfl:  .  Cholecalciferol (VITAMIN D3) 5000 UNITS CAPS, Take 1 capsule (5,000 Units total) by mouth daily., Disp: 30 capsule, Rfl:  .  DiphenhydrAMINE HCl, Sleep, (NYT-TIME SLEEP PO), Take by mouth., Disp: , Rfl:  .  doxycycline (VIBRAMYCIN) 100 MG capsule, Take 1 capsule (100 mg total) by mouth 2 (two)  times daily., Disp: 20 capsule, Rfl: 0 .  DULoxetine (CYMBALTA) 60 MG capsule, Take 2 capsules (120 mg total) by mouth daily., Disp: 90 capsule, Rfl: 3 .  fluticasone (FLONASE) 50 MCG/ACT nasal spray, Place 1 spray into both nostrils daily., Disp: , Rfl:  .  guaiFENesin-codeine (CHERATUSSIN AC) 100-10 MG/5ML syrup, Take 10 mLs by mouth 4 (four) times daily as needed for cough., Disp: 120 mL, Rfl: 0 .  magnesium oxide (MAG-OX) 400 MG tablet, Take 400 mg by mouth daily., Disp: , Rfl:  .  predniSONE (DELTASONE) 20 MG tablet, Take 2 tablets  (40 mg total) by mouth daily with breakfast., Disp: 10 tablet, Rfl: 0 .  Spacer/Aero-Holding Chambers (AEROCHAMBER PLUS) inhaler, Use as instructed, Disp: 1 each, Rfl: 2 .  traZODone (DESYREL) 50 MG tablet, Take 0.5-1 tablets (25-50 mg total) by mouth at bedtime as needed for sleep., Disp: 30 tablet, Rfl: 2 .  valACYclovir (VALTREX) 1000 MG tablet, Take 1 tablet (1,000 mg total) by mouth daily., Disp: 90 tablet, Rfl: 3 .  vitamin B-12 (CYANOCOBALAMIN) 1000 MCG tablet, Take 1 tablet (1,000 mcg total) by mouth daily., Disp: 30 tablet, Rfl: 0  Allergies  Allergen Reactions  . Amoxicillin-Pot Clavulanate Other (See Comments)    UNSPECIFIED   . Penicillins Nausea And Vomiting    Has patient had a PCN reaction causing immediate rash, facial/tongue/throat swelling, SOB or lightheadedness with hypotension: no Has patient had a PCN reaction causing severe rash involving mucus membranes or skin necrosis: No Has patient had a PCN reaction that required hospitalization No Has patient had a PCN reaction occurring within the last 10 years: Yes If all of the above answers are "NO", then may proceed with Cephalosporin use.      ROS  As noted in HPI.   Physical Exam  BP 132/72 (BP Location: Left Arm)   Pulse 72   Temp 98.6 F (37 C) (Oral)   Resp 18   Ht 5\' 8"  (1.727 m)   Wt 240 lb (108.9 kg)   SpO2 99%   BMI 36.49 kg/m   Constitutional: Well developed, well nourished, no acute distress Eyes: PERRL, EOMI, conjunctiva normal bilaterally HENT: Normocephalic, atraumatic,mucus membranes moist Respiratory: Clear to auscultation bilaterally, no rales, no wheezing, no rhonchi. Positive reproduceable posterior chest wall tenderness along the lower ribs, no crepitus. Cardiovascular: Normal rate and rhythm, no murmurs, no gallops, no rubs GI: nondistended skin: No rash Over posterior chest, skin intact Musculoskeletal: Calves symmetric, nontender No edema, no tenderness, no deformities Neurologic:  Alert & oriented x 3, CN II-XII grossly intact, no motor deficits, sensation grossly intact Psychiatric: Speech and behavior appropriate   ED Course   Medications - No data to display  Orders Placed This Encounter  Procedures  . DG Chest 2 View    Standing Status:   Standing    Number of Occurrences:   1    Order Specific Question:   Reason for Exam (SYMPTOM  OR DIAGNOSIS REQUIRED)    Answer:   L pleuritic post CP URI x 2.5 weeks r/o PNA PTX, effusion   No results found for this or any previous visit (from the past 24 hour(s)). Dg Chest 2 View  Result Date: 05/13/2016 CLINICAL DATA:  Pt c/o difficulty breathing while sleeping. She says she has had walking pneumonia. EXAM: CHEST  2 VIEW COMPARISON:  03/11/2011 FINDINGS: The heart size and mediastinal contours are within normal limits. Both lungs are clear. The visualized skeletal structures are unremarkable. IMPRESSION: No  active cardiopulmonary disease. Electronically Signed   By: Elige Ko   On: 05/13/2016 17:39    ED Clinical Impression  Upper respiratory tract infection, unspecified type  Musculoskeletal chest pain   ED Assessment/Plan  Doubt PE or dissection.. She is not tachycardic, she is satting well on room air. Doubt rib fracture. Also in the differential is musculoskeletal chest pain. Will check chest x-ray to rule out a pneumonia, however, given the fact that she has had URI-like symptoms for the past 2-1/2 weeks and posterior chest pain that she states this feels identical to previous pneumonias, plan to send home with doxycycline which will cover both a sinus infection and pneumonia, albuterol with spacer, prednisone 40 mg 5 days, Cheratussin as needed for cough at night. She will continue the Aleve and Flonase for the headache, posterior chest pain and nasal congestion respectively. She will follow-up with her primary care physician in several days. She'll go to the ER if she gets worse.  Imaging independently  reviewed. Normal. No pneumonia. See radiology report for details.   Plan as above.  Discussed imaging, MDM, plan and followup with patient . Discussed sn/sx that should prompt return to the ED. Patient agrees with plan.   Meds ordered this encounter  Medications  . doxycycline (VIBRAMYCIN) 100 MG capsule    Sig: Take 1 capsule (100 mg total) by mouth 2 (two) times daily.    Dispense:  20 capsule    Refill:  0  . albuterol (PROVENTIL HFA;VENTOLIN HFA) 108 (90 Base) MCG/ACT inhaler    Sig: Inhale 2 puffs into the lungs every 4 (four) hours as needed for wheezing or shortness of breath. Dispense with aerochamber    Dispense:  1 Inhaler    Refill:  0  . predniSONE (DELTASONE) 20 MG tablet    Sig: Take 2 tablets (40 mg total) by mouth daily with breakfast.    Dispense:  10 tablet    Refill:  0  . Spacer/Aero-Holding Chambers (AEROCHAMBER PLUS) inhaler    Sig: Use as instructed    Dispense:  1 each    Refill:  2  . guaiFENesin-codeine (CHERATUSSIN AC) 100-10 MG/5ML syrup    Sig: Take 10 mLs by mouth 4 (four) times daily as needed for cough.    Dispense:  120 mL    Refill:  0    *This clinic note was created using Scientist, clinical (histocompatibility and immunogenetics). Therefore, there may be occasional mistakes despite careful proofreading.  ?   Domenick Gong, MD 05/13/16 2213

## 2016-06-09 ENCOUNTER — Other Ambulatory Visit: Payer: Self-pay | Admitting: Family Medicine

## 2016-07-08 ENCOUNTER — Telehealth: Payer: Self-pay

## 2016-07-08 ENCOUNTER — Other Ambulatory Visit: Payer: Self-pay | Admitting: Family Medicine

## 2016-07-08 MED ORDER — VALACYCLOVIR HCL 1 G PO TABS: 1000.0000 mg | ORAL_TABLET | Freq: Every day | ORAL | 3 refills | Status: DC

## 2016-07-08 NOTE — Telephone Encounter (Signed)
Sent!

## 2016-07-08 NOTE — Telephone Encounter (Signed)
Refill request Valacyclovir 1000 MG that she keeps due to internal and external break outs. [facility]

## 2016-07-09 NOTE — Telephone Encounter (Signed)
Left detailed message to pick up meds.

## 2016-07-22 ENCOUNTER — Ambulatory Visit: Payer: Self-pay | Admitting: Family Medicine

## 2016-07-24 ENCOUNTER — Ambulatory Visit: Payer: Self-pay | Admitting: Family Medicine

## 2016-07-25 ENCOUNTER — Encounter: Payer: Self-pay | Admitting: Family Medicine

## 2016-07-25 ENCOUNTER — Ambulatory Visit (INDEPENDENT_AMBULATORY_CARE_PROVIDER_SITE_OTHER): Payer: Medicare Other | Admitting: Family Medicine

## 2016-07-25 VITALS — BP 138/84 | HR 76 | Ht 68.0 in | Wt 245.0 lb

## 2016-07-25 DIAGNOSIS — R7303 Prediabetes: Secondary | ICD-10-CM | POA: Diagnosis not present

## 2016-07-25 DIAGNOSIS — B029 Zoster without complications: Secondary | ICD-10-CM | POA: Diagnosis not present

## 2016-07-25 DIAGNOSIS — M797 Fibromyalgia: Secondary | ICD-10-CM | POA: Diagnosis not present

## 2016-07-25 DIAGNOSIS — E559 Vitamin D deficiency, unspecified: Secondary | ICD-10-CM

## 2016-07-25 DIAGNOSIS — E669 Obesity, unspecified: Secondary | ICD-10-CM

## 2016-07-25 DIAGNOSIS — E538 Deficiency of other specified B group vitamins: Secondary | ICD-10-CM

## 2016-07-26 MED ORDER — TRAZODONE HCL 50 MG PO TABS: 50.0000 mg | ORAL_TABLET | Freq: Every evening | ORAL | 2 refills | Status: DC | PRN

## 2016-07-26 NOTE — Progress Notes (Signed)
Date:  07/25/2016   Name:  Ann Peters   DOB:  05/22/1950   MRN:  [id number]  PCP:  Schuyler AmorPlonk, Nike Southwell, MD    Chief Complaint: Herpes Zoster (internal shingles)   History of Present Illness:  This is a 66 y.o. female seen for three month f/u. Trazodone helped some, no longer taking, sleeping ok. Fibromyalgia sxs stable on Cymbalta. Taking Valtrex daily for suppression. Weight stable.  Review of Systems:  Review of Systems  Constitutional: Negative for chills and fever.  Respiratory: Negative for cough and shortness of breath.   Cardiovascular: Negative for chest pain and leg swelling.  Endocrine: Negative for polydipsia and polyuria.  Genitourinary: Negative for difficulty urinating.  Neurological: Negative for syncope and light-headedness.    Patient Active Problem List   Diagnosis Date Noted  . Osteoarthritis of left hip 10/10/2015  . Status post left hip replacement 10/10/2015  . Onychomycosis of left great toe 05/10/2015  . Prediabetes 01/03/2015  . Diverticulitis 09/01/2014  . Vitamin D deficiency 09/01/2014  . Herpes zoster 09/01/2014  . FH: diabetes mellitus 09/01/2014  . FHx: heart disease 09/01/2014  . FH: lymphoma 09/01/2014  . Reactive cervical lymphadenopathy 09/01/2014  . Obesity, Class II, BMI 35-39.9, no comorbidity 09/01/2014  . Clinical depression 11/15/2013  . Fibromyalgia 11/15/2013  . Vitamin B12 deficiency 12/23/2011  . Allergic rhinitis 04/18/2011    Prior to Admission medications   Medication Sig Start Date End Date Taking? Authorizing Provider  albuterol (PROVENTIL HFA;VENTOLIN HFA) 108 (90 Base) MCG/ACT inhaler Inhale 2 puffs into the lungs every 4 (four) hours as needed for wheezing or shortness of breath. Dispense with aerochamber 05/13/16  Yes Domenick GongMortenson, Ashley, MD  APPLE CIDER VINEGAR PO Take 30 mLs by mouth daily.   Yes [provider]  Cholecalciferol (VITAMIN D3) 5000 UNITS CAPS Take 1 capsule (5,000 Units total) by mouth daily.  09/01/14  Yes Lochlann Mastrangelo, Chrissie NoaWilliam, MD  DULoxetine (CYMBALTA) 60 MG capsule Take 2 capsules (120 mg total) by mouth daily. 01/23/16 01/22/17 Yes Halynn Reitano, Chrissie NoaWilliam, MD  fluticasone (FLONASE) 50 MCG/ACT nasal spray Place 1 spray into both nostrils daily.   Yes [provider]  traZODone (DESYREL) 50 MG tablet TAKE 1/2 TO 1 TABLET(25 TO 50 MG) BY MOUTH AT BEDTIME AS NEEDED FOR SLEEP 06/10/16  Yes Akasha Melena, Chrissie NoaWilliam, MD  valACYclovir (VALTREX) 1000 MG tablet Take 1 tablet (1,000 mg total) by mouth daily. 07/08/16  Yes Hermine Feria, Chrissie NoaWilliam, MD  vitamin B-12 (CYANOCOBALAMIN) 1000 MCG tablet Take 1 tablet (1,000 mcg total) by mouth daily. 09/01/14  Yes Zyia Kaneko, Chrissie NoaWilliam, MD    Allergies  Allergen Reactions  . Amoxicillin-Pot Clavulanate Other (See Comments)    UNSPECIFIED   . Penicillins Nausea And Vomiting    Has patient had a PCN reaction causing immediate rash, facial/tongue/throat swelling, SOB or lightheadedness with hypotension: no Has patient had a PCN reaction causing severe rash involving mucus membranes or skin necrosis: No Has patient had a PCN reaction that required hospitalization No Has patient had a PCN reaction occurring within the last 10 years: Yes If all of the above answers are "NO", then may proceed with Cephalosporin use.     Past Surgical History:  Procedure Laterality Date  . BLADDER REPAIR    . COLON SURGERY     part of colon removed  . COLONOSCOPY  2011   polyp removed- Dr Marva PandaSkulskie  . NASAL SINUS SURGERY    . TONSILLECTOMY    . TOTAL HIP ARTHROPLASTY Left 10/10/2015  Procedure: LEFT TOTAL HIP ARTHROPLASTY ANTERIOR APPROACH;  Surgeon: Kathryne Hitch, MD;  Location: [facility];  Service: Orthopedics;  Laterality: Left;  . TUBAL LIGATION    . VAGINAL HYSTERECTOMY      Social History  Substance Use Topics  . Smoking status: Never Smoker  . Smokeless tobacco: Never Used  . Alcohol use No    Family History  Problem Relation Age of Onset  . Cancer Mother   . Lymphoma Mother    . Breast cancer Sister 65    Medication list has been reviewed and updated.  Physical Examination: BP 138/84   Pulse 76   Ht 5\' 8"  (1.727 m)   Wt 245 lb (111.1 kg)   SpO2 96%   BMI 37.25 kg/m   Physical Exam  Constitutional: She appears well-developed and well-nourished.  Cardiovascular: Normal rate, regular rhythm and normal heart sounds.   Pulmonary/Chest: Effort normal and breath sounds normal.  Musculoskeletal: She exhibits no edema.  Neurological: She is alert.  Skin: Skin is warm and dry.  Psychiatric: She has a normal mood and affect. Her behavior is normal.  Nursing note and vitals reviewed.   Assessment and Plan:  1. Fibromyalgia Well controlled on Cymbalta  2. Vitamin B12 deficiency On supplement - B12  3. Vitamin D deficiency On supplement - Vitamin D (25 hydroxy)  4. Prediabetes - HgB A1c  5. Herpes zoster without complication Cont daily Valtrex  6. Obesity, Class II, BMI 35-39.9, no comorbidity Exercise/weight loss discussed  7. Med review D/c vit C/Mg/B complex  Return in about 6 months (around 01/24/2017).  Dionne Ano. Kingsley Spittle MD [facility]  07/26/2016

## 2016-07-30 ENCOUNTER — Other Ambulatory Visit: Payer: Self-pay | Admitting: Family Medicine

## 2016-07-30 ENCOUNTER — Telehealth: Payer: Self-pay | Admitting: *Deleted

## 2016-07-30 DIAGNOSIS — M797 Fibromyalgia: Secondary | ICD-10-CM

## 2016-07-30 MED ORDER — DULOXETINE HCL 60 MG PO CPEP: 120.0000 mg | ORAL_CAPSULE | Freq: Every day | ORAL | 3 refills | Status: DC

## 2016-07-30 NOTE — Telephone Encounter (Signed)
Request for refill of Duloxetine DR 60 MG. Rx written for #90 with 3 refills to last from Dec 2017 to Dec. 2018. Dosing is for 2 a day so she has run out.

## 2016-07-30 NOTE — Telephone Encounter (Signed)
Done

## 2016-08-07 ENCOUNTER — Other Ambulatory Visit: Payer: Self-pay

## 2016-08-07 DIAGNOSIS — M797 Fibromyalgia: Secondary | ICD-10-CM

## 2016-08-07 MED ORDER — DULOXETINE HCL 60 MG PO CPEP: 120.0000 mg | ORAL_CAPSULE | Freq: Every day | ORAL | 3 refills | Status: DC

## 2016-09-16 ENCOUNTER — Encounter: Payer: Self-pay | Admitting: Family Medicine

## 2016-09-16 ENCOUNTER — Ambulatory Visit (INDEPENDENT_AMBULATORY_CARE_PROVIDER_SITE_OTHER): Payer: Medicare Other | Admitting: Family Medicine

## 2016-09-16 VITALS — BP 122/82 | HR 87 | Temp 98.4°F | Resp 16 | Ht 68.0 in | Wt 243.0 lb

## 2016-09-16 DIAGNOSIS — M797 Fibromyalgia: Secondary | ICD-10-CM | POA: Diagnosis not present

## 2016-09-16 DIAGNOSIS — E559 Vitamin D deficiency, unspecified: Secondary | ICD-10-CM

## 2016-09-16 DIAGNOSIS — R7303 Prediabetes: Secondary | ICD-10-CM

## 2016-09-16 DIAGNOSIS — E538 Deficiency of other specified B group vitamins: Secondary | ICD-10-CM

## 2016-09-16 DIAGNOSIS — E669 Obesity, unspecified: Secondary | ICD-10-CM

## 2016-09-16 DIAGNOSIS — J069 Acute upper respiratory infection, unspecified: Secondary | ICD-10-CM | POA: Diagnosis not present

## 2016-09-16 NOTE — Patient Instructions (Signed)
Try Sudafed 12 hour twice daily or Afrin nasal spray as directed for 3 days, call if your symptoms worsen or persist.

## 2016-09-16 NOTE — Progress Notes (Signed)
Date:  09/16/2016   Name:  Ann Peters   DOB:  05/27/1950   MRN:  [id number]  PCP:  Schuyler AmorPlonk, Corde Antonini, MD    Chief Complaint: Sinus Problem (Nasal congestion x 1 week. )   History of Present Illness:  This is a 66 y.o. female seen for one week hx nasal/sinus congestion with insomnia, occ rhinorrhea, sneezing, and otalgia. Neosynephrine and saline NS did not help. Otherwise doing well but forgot to get blood work last visit.  Review of Systems:  Review of Systems  Constitutional: Negative for chills and fever.  HENT: Negative for facial swelling and sore throat.   Respiratory: Negative for cough and shortness of breath.   Cardiovascular: Negative for chest pain and leg swelling.  Neurological: Negative for syncope and light-headedness.  Hematological: Negative for adenopathy.    Patient Active Problem List   Diagnosis Date Noted  . Osteoarthritis of left hip 10/10/2015  . Status post left hip replacement 10/10/2015  . Onychomycosis of left great toe 05/10/2015  . Prediabetes 01/03/2015  . Diverticulitis 09/01/2014  . Vitamin D deficiency 09/01/2014  . Herpes zoster 09/01/2014  . FH: diabetes mellitus 09/01/2014  . FHx: heart disease 09/01/2014  . FH: lymphoma 09/01/2014  . Reactive cervical lymphadenopathy 09/01/2014  . Obesity, Class II, BMI 35-39.9, no comorbidity 09/01/2014  . Clinical depression 11/15/2013  . Fibromyalgia 11/15/2013  . Vitamin B12 deficiency 12/23/2011  . Allergic rhinitis 04/18/2011    Prior to Admission medications   Medication Sig Start Date End Date Taking? Authorizing Provider  albuterol (PROVENTIL HFA;VENTOLIN HFA) 108 (90 Base) MCG/ACT inhaler Inhale 2 puffs into the lungs every 4 (four) hours as needed for wheezing or shortness of breath. Dispense with aerochamber 05/13/16  Yes Domenick GongMortenson, Ashley, MD  APPLE CIDER VINEGAR PO Take 30 mLs by mouth daily.   Yes [provider]  Cholecalciferol (VITAMIN D3) 5000 UNITS CAPS Take 1 capsule  (5,000 Units total) by mouth daily. 09/01/14  Yes Froylan Hobby, Chrissie NoaWilliam, MD  DULoxetine (CYMBALTA) 60 MG capsule Take 2 capsules (120 mg total) by mouth daily. 08/07/16 08/07/17 Yes Nicklos Gaxiola, Chrissie NoaWilliam, MD  fluticasone (FLONASE) 50 MCG/ACT nasal spray Place 1 spray into both nostrils daily.   Yes [provider]  sodium chloride (OCEAN) 0.65 % SOLN nasal spray Place 1 spray into both nostrils as needed for congestion.   Yes [provider]  traZODone (DESYREL) 50 MG tablet Take 1 tablet (50 mg total) by mouth at bedtime as needed for sleep. 07/26/16  Yes Telina Kleckley, Chrissie NoaWilliam, MD  valACYclovir (VALTREX) 1000 MG tablet Take 1 tablet (1,000 mg total) by mouth daily. 07/08/16  Yes Yasmina Chico, Chrissie NoaWilliam, MD  vitamin B-12 (CYANOCOBALAMIN) 1000 MCG tablet Take 1 tablet (1,000 mcg total) by mouth daily. 09/01/14  Yes Maclean Foister, Chrissie NoaWilliam, MD    Allergies  Allergen Reactions  . Amoxicillin-Pot Clavulanate Other (See Comments)    UNSPECIFIED   . Penicillins Nausea And Vomiting    Has patient had a PCN reaction causing immediate rash, facial/tongue/throat swelling, SOB or lightheadedness with hypotension: no Has patient had a PCN reaction causing severe rash involving mucus membranes or skin necrosis: No Has patient had a PCN reaction that required hospitalization No Has patient had a PCN reaction occurring within the last 10 years: Yes If all of the above answers are "NO", then may proceed with Cephalosporin use.     Past Surgical History:  Procedure Laterality Date  . BLADDER REPAIR    . COLON SURGERY  part of colon removed  . COLONOSCOPY  2011   polyp removed- Dr Marva PandaSkulskie  . NASAL SINUS SURGERY    . TONSILLECTOMY    . TOTAL HIP ARTHROPLASTY Left 10/10/2015   Procedure: LEFT TOTAL HIP ARTHROPLASTY ANTERIOR APPROACH;  Surgeon: Kathryne Hitchhristopher Y Blackman, MD;  Location: [facility] OR;  Service: Orthopedics;  Laterality: Left;  . TUBAL LIGATION    . VAGINAL HYSTERECTOMY      Social History  Substance Use Topics  .  Smoking status: Never Smoker  . Smokeless tobacco: Never Used  . Alcohol use No    Family History  Problem Relation Age of Onset  . Cancer Mother   . Lymphoma Mother   . Breast cancer Sister [id number]    Medication list has been reviewed and updated.  Physical Examination: BP 122/82   Pulse 87   Temp 98.4 F (36.9 C) (Oral)   Resp 16   Ht 5\' 8"  (1.727 m)   Wt 243 lb (110.2 kg)   SpO2 97%   BMI 36.95 kg/m   Physical Exam  Constitutional: She appears well-developed and well-nourished.  HENT:  Right Ear: External ear normal.  Left Ear: External ear normal.  Nose: Nose normal.  Mouth/Throat: Oropharynx is clear and moist.  TMs clear Mild B max sinus tenderness  Neck: Neck supple.  Cardiovascular: Normal rate, regular rhythm and normal heart sounds.   Pulmonary/Chest: Effort normal and breath sounds normal.  Musculoskeletal: She exhibits no edema.  Lymphadenopathy:    She has no cervical adenopathy.  Neurological: She is alert.  Skin: Skin is warm and dry.  Psychiatric: She has a normal mood and affect. Her behavior is normal.  Nursing note and vitals reviewed.   Assessment and Plan:  1. Viral upper respiratory tract infection No clear indication for abx, trial Sudafed 12h or Afrin NS x 3d, call if sxs worsen/persist  2. Fibromyalgia Well controlled on Cymbalta  3. Prediabetes - HgB A1c  4. Vitamin B12 deficiency On supplement - B12  5. Vitamin D deficiency On supplement - Vitamin D (25 hydroxy)  6. Obesity, Class II, BMI 35-39.9, no comorbidity Weight down 2#, encouraged further weight loss  Return if symptoms worsen or fail to improve.  [name] [name], Jr. MD [facility]  09/16/2016

## 2016-09-17 ENCOUNTER — Ambulatory Visit
Admission: EM | Admit: 2016-09-17 | Discharge: 2016-09-17 | Disposition: A | Discharge status: Home / Self Care | Payer: Medicare Other | Attending: Family Medicine | Admitting: Family Medicine

## 2016-09-17 ENCOUNTER — Encounter: Payer: Self-pay | Admitting: Emergency Medicine

## 2016-09-17 ENCOUNTER — Other Ambulatory Visit: Payer: Self-pay | Admitting: Family Medicine

## 2016-09-17 DIAGNOSIS — J0100 Acute maxillary sinusitis, unspecified: Secondary | ICD-10-CM

## 2016-09-17 DIAGNOSIS — J320 Chronic maxillary sinusitis: Secondary | ICD-10-CM | POA: Diagnosis not present

## 2016-09-17 LAB — HEMOGLOBIN A1C
Est. average glucose Bld gHb Est-mCnc: 126 mg/dL
HEMOGLOBIN A1C: 6 % — AB (ref 4.8–5.6)

## 2016-09-17 LAB — VITAMIN D 25 HYDROXY (VIT D DEFICIENCY, FRACTURES): VIT D 25 HYDROXY: 33.4 ng/mL (ref 30.0–100.0)

## 2016-09-17 LAB — VITAMIN B12: VITAMIN B 12: 1861 pg/mL — AB (ref 232–1245)

## 2016-09-17 MED ORDER — FLUTICASONE PROPIONATE 50 MCG/ACT NA SUSP: 2.0000 | Freq: Every day | NASAL | 0 refills | Status: DC

## 2016-09-17 MED ORDER — CYANOCOBALAMIN 500 MCG PO TABS: 500.0000 ug | ORAL_TABLET | Freq: Every day | ORAL | Status: DC

## 2016-09-17 MED ORDER — CEFUROXIME AXETIL 250 MG PO TABS: ORAL_TABLET | ORAL | 0 refills | Status: DC

## 2016-09-17 NOTE — ED Triage Notes (Signed)
Patient c/o nasal congestion and sinus congestion and pressure since Saturday.

## 2016-09-17 NOTE — ED Provider Notes (Signed)
CSN: [id number]     Arrival date & time 09/17/16  1222 History   First MD Initiated Contact with Patient 09/17/16 1244     Chief Complaint  Patient presents with  . Nasal Congestion  . Sinus Problem   (Consider location/radiation/quality/duration/timing/severity/associated sxs/prior Treatment) HPI  This a 66 year old female who presents with severe nasal congestion severe facial pain over her maxillary sinuses along with pressure and a purulent  discharge greenish and yellow. She's had this for 3 days. She attempted to see Dr. Genevive BiJuengle but was unable to obtain an appointment until next Monday. She has had no fever or chills. He has had endoscopic sinus surgery in the past. She states she is just miserable and will have recurrent sinus infections several times per year.    Past Medical History:  Diagnosis Date  . Anxiety   . Depression   . History of shingles    ON AND OFF 6 YEARS  . Neuromuscular disorder (HCC)    POSTHERPETIC NEURALGIA  . Pneumonia    HX   SEVERAL TIMES  NONE IN 3 YRS   Past Surgical History:  Procedure Laterality Date  . BLADDER REPAIR    . COLON SURGERY     part of colon removed  . COLONOSCOPY  2011   polyp removed- Dr Marva PandaSkulskie  . NASAL SINUS SURGERY    . TONSILLECTOMY    . TOTAL HIP ARTHROPLASTY Left 10/10/2015   Procedure: LEFT TOTAL HIP ARTHROPLASTY ANTERIOR APPROACH;  Surgeon: Kathryne Hitchhristopher Y Blackman, MD;  Location: [facility] OR;  Service: Orthopedics;  Laterality: Left;  . TUBAL LIGATION    . VAGINAL HYSTERECTOMY     Family History  Problem Relation Age of Onset  . Cancer Mother   . Lymphoma Mother   . Breast cancer Sister [id number]   Social History  Substance Use Topics  . Smoking status: Never Smoker  . Smokeless tobacco: Never Used  . Alcohol use No   OB History    Gravida Para Term Preterm AB Living   4       1     SAB TAB Ectopic Multiple Live Births   1             Review of Systems  Constitutional: Positive for activity change. Negative for  appetite change, chills, fatigue and fever.  HENT: Positive for congestion, sinus pain and sinus pressure. Negative for rhinorrhea.   Respiratory: Negative for cough.   All other systems reviewed and are negative.   Allergies  Amoxicillin-pot clavulanate and Penicillins  Home Medications   Prior to Admission medications   Medication Sig Start Date End Date Taking? Authorizing Provider  albuterol (PROVENTIL HFA;VENTOLIN HFA) 108 (90 Base) MCG/ACT inhaler Inhale 2 puffs into the lungs every 4 (four) hours as needed for wheezing or shortness of breath. Dispense with aerochamber 05/13/16   Domenick GongMortenson, Ashley, MD  APPLE CIDER VINEGAR PO Take 30 mLs by mouth daily.    [provider]  cefUROXime (CEFTIN) 250 MG tablet Take one tablet BID with food 09/17/16   Lutricia Feiloemer, Kaizlee Carlino P, PA-C  Cholecalciferol (VITAMIN D3) 5000 UNITS CAPS Take 1 capsule (5,000 Units total) by mouth daily. 09/01/14   Plonk, Chrissie NoaWilliam, MD  cyanocobalamin 500 MCG tablet Take 1 tablet (500 mcg total) by mouth daily. 09/17/16   Plonk, Chrissie NoaWilliam, MD  DULoxetine (CYMBALTA) 60 MG capsule Take 2 capsules (120 mg total) by mouth daily. 08/07/16 08/07/17  Plonk, Chrissie NoaWilliam, MD  fluticasone (FLONASE) 50 MCG/ACT nasal spray Place 2  sprays into both nostrils daily. 09/17/16   Lutricia Feiloemer, Alaisa Moffitt P, PA-C  sodium chloride (OCEAN) 0.65 % SOLN nasal spray Place 1 spray into both nostrils as needed for congestion.    [provider]  traZODone (DESYREL) 50 MG tablet Take 1 tablet (50 mg total) by mouth at bedtime as needed for sleep. 07/26/16   Plonk, Chrissie NoaWilliam, MD  valACYclovir (VALTREX) 1000 MG tablet Take 1 tablet (1,000 mg total) by mouth daily. 07/08/16   Plonk, Chrissie NoaWilliam, MD   Meds Ordered and Administered this Visit  Medications - No data to display  BP (!) 148/99 (BP Location: Left Arm)   Pulse 81   Temp 98.2 F (36.8 C) (Oral)   Resp 16   Wt 243 lb (110.2 kg)   SpO2 98%   BMI 36.95 kg/m  No data found.   Physical Exam   Constitutional: She is oriented to person, place, and time. She appears well-developed and well-nourished. No distress.  HENT:  Head: Normocephalic and atraumatic.  Right Ear: External ear normal.  Left Ear: External ear normal.  Mouth/Throat: Oropharynx is clear and moist. No oropharyngeal exudate.  Nose is extremely swollen turbinates that are erythematous. She has significant tenderness over the maxillary sinuses to percussion and pressure alone. She is negative over the frontal sinuses. No anterior cervical adenopathy present.  Eyes: Pupils are equal, round, and reactive to light. Right eye exhibits no discharge. Left eye exhibits no discharge.  Neck: Normal range of motion. Neck supple.  Pulmonary/Chest: Effort normal and breath sounds normal.  Musculoskeletal: Normal range of motion.  Lymphadenopathy:    She has no cervical adenopathy.  Neurological: She is alert and oriented to person, place, and time.  Skin: Skin is warm and dry. She is not diaphoretic.  Psychiatric: She has a normal mood and affect. Her behavior is normal. Judgment and thought content normal.  Nursing note and vitals reviewed.   Urgent Care Course     Procedures (including critical care time)  Labs Review Labs Reviewed - No data to display  Imaging Review No results found.   Visual Acuity Review  Right Eye Distance:   Left Eye Distance:   Bilateral Distance:    Right Eye Near:   Left Eye Near:    Bilateral Near:         MDM   1. Acute maxillary sinusitis, recurrence not specified    Discharge Medication List as of 09/17/2016 12:59 PM    START taking these medications   Details  cefUROXime (CEFTIN) 250 MG tablet Take one tablet BID with food, Normal      Flonase daily. Plan: 1. Test/x-ray results and diagnosis reviewed with patient 2. rx as per orders; risks, benefits, potential side effects reviewed with patient 3. Recommend supportive treatment with use of a Nettie pot.  Discontinue all Afrin. Start the ITT IndustriesFlonase daily. Because of the severe maxillary tenderness as well as the purulent type discharge and her previous history of sinusitis which she states the ENT treat her with antibiotics I will start her on Ceftin. I've encouraged her to follow-up with her ENT next week. If she begins to run a fever she should begin using Motrin or Tylenol and can also use that currently to help relieve the pain and pressure in her sinuses. 4. F/u prn if symptoms worsen or don't improve    Lutricia FeilRoemer, Antwan Pandya P, PA-C 09/17/16 1331

## 2016-09-25 ENCOUNTER — Ambulatory Visit (INDEPENDENT_AMBULATORY_CARE_PROVIDER_SITE_OTHER): Payer: Medicare Other | Admitting: Orthopaedic Surgery

## 2016-09-30 ENCOUNTER — Ambulatory Visit (INDEPENDENT_AMBULATORY_CARE_PROVIDER_SITE_OTHER): Payer: Medicare Other | Admitting: Orthopaedic Surgery

## 2016-10-15 ENCOUNTER — Telehealth: Payer: Self-pay

## 2016-10-15 NOTE — Telephone Encounter (Signed)
Patient messed up around 6 weeks ago and started taking only 1 Cymbalta instead of the 2 30 mg that she usually takes.  She felt depression about 2 weeks ago and noticed she had way to many and realized what she had done. She has been back on the 60 mg (2 of the 30s) now for almost 2 weeks and called crying saying she is nervous and depressed and crying all the time and shaking and feels the meds are not getting in system fast enough. Requesting just limited amount of xanax to help her until she is back used to the right dose. Dr. Judithann Graves said to wait for you to return Advised I will ask about Xanax but we usually do not give this and I also offered her to go to ER or [facility] if she feels like she is not able to wait due to her health.

## 2016-10-16 NOTE — Telephone Encounter (Signed)
I have her taking Cymbalta 120 mg (two 60 mg tablets) daily. Is she still confused about the dose? Her depressive sxs may be withdrawal from a higher dose. Do not recommend Xanax use.

## 2016-10-17 NOTE — Telephone Encounter (Signed)
Pt coming in tomorrow morning, she is taking 120 mg.

## 2016-10-17 NOTE — Telephone Encounter (Signed)
Happy to see her today or tomorrow. Is she taking the Cymbalta 120 mg daily as prescribed? Does she need a refill called in?

## 2016-10-17 NOTE — Telephone Encounter (Signed)
Pt's husband came in wondering if there is anything you can do for his wife, he is worried she is very depressed. He states she is sleeping over 18 hours a day he is very concerned for her physical and mental health and needs help with her situation. He wants her to come in and have a evaluation and see about her medication. Please advise.

## 2016-10-18 ENCOUNTER — Ambulatory Visit (INDEPENDENT_AMBULATORY_CARE_PROVIDER_SITE_OTHER): Payer: Medicare Other | Admitting: Family Medicine

## 2016-10-18 ENCOUNTER — Encounter: Payer: Self-pay | Admitting: Family Medicine

## 2016-10-18 VITALS — BP 118/82 | HR 82 | Resp 16 | Ht 68.0 in | Wt 236.0 lb

## 2016-10-18 DIAGNOSIS — B029 Zoster without complications: Secondary | ICD-10-CM | POA: Diagnosis not present

## 2016-10-18 DIAGNOSIS — E538 Deficiency of other specified B group vitamins: Secondary | ICD-10-CM

## 2016-10-18 DIAGNOSIS — R7303 Prediabetes: Secondary | ICD-10-CM | POA: Diagnosis not present

## 2016-10-18 DIAGNOSIS — E559 Vitamin D deficiency, unspecified: Secondary | ICD-10-CM

## 2016-10-18 DIAGNOSIS — F339 Major depressive disorder, recurrent, unspecified: Secondary | ICD-10-CM | POA: Diagnosis not present

## 2016-10-18 DIAGNOSIS — M797 Fibromyalgia: Secondary | ICD-10-CM | POA: Diagnosis not present

## 2016-10-18 DIAGNOSIS — E669 Obesity, unspecified: Secondary | ICD-10-CM

## 2016-10-18 DIAGNOSIS — J329 Chronic sinusitis, unspecified: Secondary | ICD-10-CM | POA: Insufficient documentation

## 2016-10-18 HISTORY — DX: Chronic sinusitis, unspecified: J32.9

## 2016-10-18 MED ORDER — ARIPIPRAZOLE 5 MG PO TABS: 5.0000 mg | ORAL_TABLET | Freq: Every day | ORAL | 2 refills | Status: DC

## 2016-10-18 MED ORDER — TRAZODONE HCL 50 MG PO TABS: 50.0000 mg | ORAL_TABLET | Freq: Three times a day (TID) | ORAL | 2 refills | Status: DC | PRN

## 2016-10-18 NOTE — Progress Notes (Signed)
Date:  10/18/2016   Name:  Ann Peters   DOB:  03-10-1950   MRN:  [id number]  PCP:  Schuyler Amor, MD    Chief Complaint: Depression (Took one 60 mg daily for a few weeks then noticed she was depressed and realized this and has been on the 120 mg for about 1.5 week. ) and Anxiety (Now taking 3 Trazadone to help with this. )   History of Present Illness:  This is a 66 y.o. female seen for one month f/u. Sinus sxs resolved on Ceftin. Two weeks ago mistakenly decreased Cymbalta dose to 60 mg daily from 120 mg daily and crashed emotionally, restarted usually dose about a week ago, feeling somewhat better today but still very depressed, also taking trazodone 50 mg tid to help with anxiety. Admits suicidal thoughts when crashed but denies today. Has been on multiple antidepressants in past. Fibromyalgia sxs also flared on decreased Cymbalta dose. Blood work last visit ok except B12 level high, decreased to 500 mcg daily. Remains on daily Valtrex for recurrent zoster. Reluctant to see psychiatrist.  Review of Systems:  Review of Systems  Constitutional: Negative for chills and fever.  Respiratory: Negative for cough and shortness of breath.   Cardiovascular: Negative for chest pain and leg swelling.  Gastrointestinal: Negative for abdominal pain.  Endocrine: Negative for polydipsia and polyuria.  Genitourinary: Negative for difficulty urinating.  Neurological: Negative for syncope and light-headedness.    Patient Active Problem List   Diagnosis Date Noted  . Sinusitis 10/18/2016  . Osteoarthritis of left hip 10/10/2015  . Status post left hip replacement 10/10/2015  . Onychomycosis of left great toe 05/10/2015  . Prediabetes 01/03/2015  . Diverticulitis 09/01/2014  . Vitamin D deficiency 09/01/2014  . Herpes zoster 09/01/2014  . FH: diabetes mellitus 09/01/2014  . FHx: heart disease 09/01/2014  . FH: lymphoma 09/01/2014  . Obesity, Class II, BMI 35-39.9, no comorbidity 09/01/2014  .  Recurrent major depression resistant to treatment (HCC) 11/15/2013  . Fibromyalgia 11/15/2013  . Vitamin B12 deficiency 12/23/2011  . Allergic rhinitis 04/18/2011    Prior to Admission medications   Medication Sig Start Date End Date Taking? Authorizing Provider  albuterol (PROVENTIL HFA;VENTOLIN HFA) 108 (90 Base) MCG/ACT inhaler Inhale 2 puffs into the lungs every 4 (four) hours as needed for wheezing or shortness of breath. Dispense with aerochamber 05/13/16  Yes Domenick Gong, MD  APPLE CIDER VINEGAR PO Take 30 mLs by mouth daily.   Yes [provider]  Cholecalciferol (VITAMIN D3) 5000 UNITS CAPS Take 1 capsule (5,000 Units total) by mouth daily. 09/01/14  Yes Brenton Joines, Chrissie Noa, MD  cyanocobalamin 500 MCG tablet Take 1 tablet (500 mcg total) by mouth daily. 09/17/16  Yes Jeret Goyer, Chrissie Noa, MD  DULoxetine (CYMBALTA) 60 MG capsule Take 2 capsules (120 mg total) by mouth daily. 08/07/16 08/07/17 Yes Antavius Sperbeck, Chrissie Noa, MD  fluticasone (FLONASE) 50 MCG/ACT nasal spray Place 2 sprays into both nostrils daily. 09/17/16  Yes Lutricia Feil, PA-C  sodium chloride (OCEAN) 0.65 % SOLN nasal spray Place 1 spray into both nostrils as needed for congestion.   Yes [provider]  traZODone (DESYREL) 50 MG tablet Take 1 tablet (50 mg total) by mouth 3 (three) times daily as needed. 10/18/16  Yes Nasim Habeeb, Chrissie Noa, MD  valACYclovir (VALTREX) 1000 MG tablet Take 1 tablet (1,000 mg total) by mouth daily. 07/08/16  Yes Arvil Utz, Chrissie Noa, MD  ARIPiprazole (ABILIFY) 5 MG tablet Take 1 tablet (5 mg total) by mouth daily.  10/18/16   Schuyler AmorPlonk, Jaeleen Inzunza, MD    Allergies  Allergen Reactions  . Amoxicillin-Pot Clavulanate Other (See Comments)    UNSPECIFIED   . Penicillins Nausea And Vomiting    Has patient had a PCN reaction causing immediate rash, facial/tongue/throat swelling, SOB or lightheadedness with hypotension: no Has patient had a PCN reaction causing severe rash involving mucus membranes or skin  necrosis: No Has patient had a PCN reaction that required hospitalization No Has patient had a PCN reaction occurring within the last 10 years: Yes If all of the above answers are "NO", then may proceed with Cephalosporin use.     Past Surgical History:  Procedure Laterality Date  . BLADDER REPAIR    . COLON SURGERY     part of colon removed  . COLONOSCOPY  2011   polyp removed- Dr Marva PandaSkulskie  . NASAL SINUS SURGERY    . TONSILLECTOMY    . TOTAL HIP ARTHROPLASTY Left 10/10/2015   Procedure: LEFT TOTAL HIP ARTHROPLASTY ANTERIOR APPROACH;  Surgeon: Kathryne Hitchhristopher Y Blackman, MD;  Location: [facility] OR;  Service: Orthopedics;  Laterality: Left;  . TUBAL LIGATION    . VAGINAL HYSTERECTOMY      Social History  Substance Use Topics  . Smoking status: Never Smoker  . Smokeless tobacco: Never Used  . Alcohol use No    Family History  Problem Relation Age of Onset  . Cancer Mother   . Lymphoma Mother   . CAD Father   . Breast cancer Sister 1760    Medication list has been reviewed and updated.  Physical Examination: BP 118/82   Pulse 82   Resp 16   Ht 5\' 8"  (1.727 m)   Wt 236 lb (107 kg)   SpO2 98%   BMI 35.88 kg/m   Physical Exam  Constitutional: She is oriented to person, place, and time. She appears well-developed and well-nourished.  Cardiovascular: Normal rate, regular rhythm and normal heart sounds.   Pulmonary/Chest: Effort normal and breath sounds normal.  Musculoskeletal: She exhibits no edema.  Neurological: She is alert and oriented to person, place, and time.  Skin: Skin is warm and dry.  Psychiatric: Her behavior is normal.  Flat affect, nearly tearful  Nursing note and vitals reviewed.   Assessment and Plan:  1. Recurrent major depression resistant to treatment (HCC) Cont Cymbalta/trazodone, add Abilify 5 mg daily, discussed importance of psychiatry input given severity and duration of sxs - Ambulatory referral to Psychiatry  2. Fibromyalgia Cont  Cymbalta  3. Herpes zoster, recurrent Cont daily Valtrex  4. Prediabetes Well controlled on diet  5. Vitamin B12 deficiency On decreased supplement  6. Vitamin D deficiency Well controlled on supplement  7. Obesity, Class II, BMI 35-39.9, no comorbidity Weight down 7#, likely due to poor appetite  Return in about 2 weeks (around 11/01/2016).   Call for recurrent suicidal ideation.  [name] [name], Jr. MD [facility]  10/18/2016

## 2016-11-01 ENCOUNTER — Ambulatory Visit: Payer: Self-pay | Admitting: Family Medicine

## 2016-11-05 ENCOUNTER — Ambulatory Visit (INDEPENDENT_AMBULATORY_CARE_PROVIDER_SITE_OTHER): Payer: Medicare Other | Admitting: Family Medicine

## 2016-11-05 ENCOUNTER — Encounter: Payer: Self-pay | Admitting: Family Medicine

## 2016-11-05 VITALS — BP 124/82 | HR 71 | Resp 16 | Ht 68.0 in | Wt 233.0 lb

## 2016-11-05 DIAGNOSIS — G4700 Insomnia, unspecified: Secondary | ICD-10-CM | POA: Diagnosis not present

## 2016-11-05 DIAGNOSIS — M797 Fibromyalgia: Secondary | ICD-10-CM

## 2016-11-05 DIAGNOSIS — Z23 Encounter for immunization: Secondary | ICD-10-CM

## 2016-11-05 DIAGNOSIS — E669 Obesity, unspecified: Secondary | ICD-10-CM

## 2016-11-05 DIAGNOSIS — F339 Major depressive disorder, recurrent, unspecified: Secondary | ICD-10-CM

## 2016-11-05 DIAGNOSIS — R7303 Prediabetes: Secondary | ICD-10-CM | POA: Diagnosis not present

## 2016-11-05 DIAGNOSIS — B029 Zoster without complications: Secondary | ICD-10-CM

## 2016-11-05 DIAGNOSIS — E559 Vitamin D deficiency, unspecified: Secondary | ICD-10-CM | POA: Diagnosis not present

## 2016-11-05 DIAGNOSIS — E538 Deficiency of other specified B group vitamins: Secondary | ICD-10-CM

## 2016-11-05 MED ORDER — TRAZODONE HCL 50 MG PO TABS: 50.0000 mg | ORAL_TABLET | Freq: Every day | ORAL | 2 refills | Status: DC

## 2016-11-05 NOTE — Patient Instructions (Signed)

## 2016-11-05 NOTE — Progress Notes (Signed)
Date:  11/05/2016   Name:  Ann Peters   DOB:  [birth year]   MRN:  [id number]  PCP:  Schuyler Amor, MD    Chief Complaint: Depression   History of Present Illness:  This is a 66 y.o. female seen for two week f/u. Took Abilify x 2 doses but made feel anxious so stopped. Feeling much better on higher dose Cymbalta, taking trazodone at night only for sleep. Taking Mg supp at daughter's recommendation and thinks is helping with nerves. Wonders if there is a mvit to take place of multiple supplements she takes.   Review of Systems:  Review of Systems  Constitutional: Negative for chills and fever.  Respiratory: Negative for cough and shortness of breath.   Cardiovascular: Negative for chest pain and leg swelling.  Genitourinary: Negative for difficulty urinating.  Neurological: Negative for syncope and light-headedness.  Psychiatric/Behavioral: Negative for agitation.    Patient Active Problem List   Diagnosis Date Noted  . Sinusitis 10/18/2016  . Osteoarthritis of left hip 10/10/2015  . Status post left hip replacement 10/10/2015  . Onychomycosis of left great toe 05/10/2015  . Prediabetes 01/03/2015  . Diverticulitis 09/01/2014  . Vitamin D deficiency 09/01/2014  . Herpes zoster 09/01/2014  . FH: diabetes mellitus 09/01/2014  . FHx: heart disease 09/01/2014  . FH: lymphoma 09/01/2014  . Obesity, Class II, BMI 35-39.9, no comorbidity 09/01/2014  . Recurrent major depression resistant to treatment (HCC) 11/15/2013  . Fibromyalgia 11/15/2013  . Vitamin B12 deficiency 12/23/2011  . Allergic rhinitis 04/18/2011    Prior to Admission medications   Medication Sig Start Date End Date Taking? Authorizing Provider  albuterol (PROVENTIL HFA;VENTOLIN HFA) 108 (90 Base) MCG/ACT inhaler Inhale 2 puffs into the lungs every 4 (four) hours as needed for wheezing or shortness of breath. Dispense with aerochamber 05/13/16  Yes Domenick Gong, MD  APPLE CIDER VINEGAR PO Take 30 mLs by mouth  daily.   Yes [provider]  Cholecalciferol (VITAMIN D3) 5000 UNITS CAPS Take 1 capsule (5,000 Units total) by mouth daily. 09/01/14  Yes Danika Kluender, Chrissie Noa, MD  cyanocobalamin 500 MCG tablet Take 1 tablet (500 mcg total) by mouth daily. 09/17/16  Yes Princessa Lesmeister, Chrissie Noa, MD  DULoxetine (CYMBALTA) 60 MG capsule Take 2 capsules (120 mg total) by mouth daily. 08/07/16 08/07/17 Yes Kerman Pfost, Chrissie Noa, MD  fluticasone (FLONASE) 50 MCG/ACT nasal spray Place 2 sprays into both nostrils daily. 09/17/16  Yes Lutricia Feil, PA-C  sodium chloride (OCEAN) 0.65 % SOLN nasal spray Place 1 spray into both nostrils as needed for congestion.   Yes [provider]  traZODone (DESYREL) 50 MG tablet Take 1 tablet (50 mg total) by mouth at bedtime. 11/05/16  Yes Mykale Gandolfo, Chrissie Noa, MD  valACYclovir (VALTREX) 1000 MG tablet Take 1 tablet (1,000 mg total) by mouth daily. 07/08/16  Yes Molina Hollenback, Chrissie Noa, MD    Allergies  Allergen Reactions  . Amoxicillin-Pot Clavulanate Other (See Comments)    UNSPECIFIED   . Penicillins Nausea And Vomiting    Has patient had a PCN reaction causing immediate rash, facial/tongue/throat swelling, SOB or lightheadedness with hypotension: no Has patient had a PCN reaction causing severe rash involving mucus membranes or skin necrosis: No Has patient had a PCN reaction that required hospitalization No Has patient had a PCN reaction occurring within the last 10 years: Yes If all of the above answers are "NO", then may proceed with Cephalosporin use.     Past Surgical History:  Procedure Laterality Date  .  BLADDER REPAIR    . COLON SURGERY     part of colon removed  . COLONOSCOPY  2011   polyp removed- Dr Marva Panda  . NASAL SINUS SURGERY    . TONSILLECTOMY    . TOTAL HIP ARTHROPLASTY Left 10/10/2015   Procedure: LEFT TOTAL HIP ARTHROPLASTY ANTERIOR APPROACH;  Surgeon: Kathryne Hitch, MD;  Location: [facility] OR;  Service: Orthopedics;  Laterality: Left;  . TUBAL LIGATION    .  VAGINAL HYSTERECTOMY      Social History  Substance Use Topics  . Smoking status: Never Smoker  . Smokeless tobacco: Never Used  . Alcohol use No    Family History  Problem Relation Age of Onset  . Cancer Mother   . Lymphoma Mother   . CAD Father   . Breast cancer Sister 57    Medication list has been reviewed and updated.  Physical Examination: BP 124/82   Pulse 71   Resp 16   Ht  (1.727 m)   Wt 233 lb (105.7 kg)   SpO2 96%   BMI 35.43 kg/m   Physical Exam  Constitutional: She is oriented to person, place, and time. She appears well-developed and well-nourished.  Cardiovascular: Normal rate, regular rhythm and normal heart sounds.   Pulmonary/Chest: Effort normal and breath sounds normal.  Musculoskeletal: She exhibits no edema.  Neurological: She is alert and oriented to person, place, and time.  Skin: Skin is warm and dry.  Psychiatric: She has a normal mood and affect. Her behavior is normal.  Nursing note and vitals reviewed.   Assessment and Plan:  1. Recurrent major depression resistant to treatment (HCC) Improved back on higher dose Cymbalta, continue  2. Insomnia, unspecified type Sleep hygiene recs given, will try to wean off trazodone  3. Fibromyalgia Improved on Cymbalta  4. Vitamin B12 deficiency On decreased dose supplement, consider recheck level next visit  5. Vitamin D deficiency Well controlled on supplement  6. Herpes zoster without complication Recurrent, well controlled on daily Valtrex  7. Prediabetes Well controlled on diet, consider a1c next visit  8. Obesity, Class II, BMI 35-39.9, no comorbidity Weight down 3#  9. Need for influenza vaccination - Flu vaccine HIGH DOSE PF (Fluzone High dose)  10. Med review Advised consider change to women's over 50 mvit to replace multiple supps  Return in about 3 months (around 02/04/2017).  Dionne Ano. Kingsley Spittle MD [facility]  11/05/2016

## 2016-11-26 ENCOUNTER — Encounter: Payer: Self-pay | Admitting: Emergency Medicine

## 2016-11-26 ENCOUNTER — Ambulatory Visit
Admission: EM | Admit: 2016-11-26 | Discharge: 2016-11-26 | Disposition: A | Discharge status: Home / Self Care | Payer: Medicare Other | Attending: Family Medicine | Admitting: Family Medicine

## 2016-11-26 DIAGNOSIS — J0100 Acute maxillary sinusitis, unspecified: Secondary | ICD-10-CM | POA: Diagnosis not present

## 2016-11-26 MED ORDER — AZITHROMYCIN 250 MG PO TABS: ORAL_TABLET | ORAL | 0 refills | Status: DC

## 2016-11-26 NOTE — ED Provider Notes (Signed)
[facility]    CSN: [id number] Arrival date & time: 11/26/16  1400     History   Chief Complaint Chief Complaint  Patient presents with  . Otalgia  . sinus congestion    HPI Ann Peters is a 66 y.o. female.   The history is provided by the patient.  Otalgia  Associated symptoms: congestion   URI  Presenting symptoms: congestion, ear pain, facial pain and fatigue   Severity:  Moderate Onset quality:  Sudden Duration:  3 weeks Timing:  Constant Progression:  Worsening Chronicity:  New Relieved by:  Nothing Ineffective treatments:  OTC medications Associated symptoms: sinus pain   Risk factors: being elderly     Past Medical History:  Diagnosis Date  . Anxiety   . Depression   . History of shingles    ON AND OFF 6 YEARS  . Neuromuscular disorder (HCC)    POSTHERPETIC NEURALGIA  . Pneumonia    HX   SEVERAL TIMES  NONE IN 3 YRS    Patient Active Problem List   Diagnosis Date Noted  . Insomnia 11/05/2016  . Sinusitis 10/18/2016  . Osteoarthritis of left hip 10/10/2015  . Status post left hip replacement 10/10/2015  . Onychomycosis of left great toe 05/10/2015  . Prediabetes 01/03/2015  . Diverticulitis 09/01/2014  . Vitamin D deficiency 09/01/2014  . Herpes zoster 09/01/2014  . FH: diabetes mellitus 09/01/2014  . FHx: heart disease 09/01/2014  . FH: lymphoma 09/01/2014  . Obesity, Class II, BMI 35-39.9, no comorbidity 09/01/2014  . Recurrent major depression resistant to treatment (HCC) 11/15/2013  . Fibromyalgia 11/15/2013  . Vitamin B12 deficiency 12/23/2011  . Allergic rhinitis 04/18/2011    Past Surgical History:  Procedure Laterality Date  . BLADDER REPAIR    . COLON SURGERY     part of colon removed  . COLONOSCOPY  2011   polyp removed- Dr Marva Panda  . NASAL SINUS SURGERY    . TONSILLECTOMY    . TOTAL HIP ARTHROPLASTY Left 10/10/2015   Procedure: LEFT TOTAL HIP ARTHROPLASTY ANTERIOR APPROACH;  Surgeon: Kathryne Hitch,  MD;  Location: [facility] OR;  Service: Orthopedics;  Laterality: Left;  . TUBAL LIGATION    . VAGINAL HYSTERECTOMY      OB History    Gravida Para Term Preterm AB Living   4       1     SAB TAB Ectopic Multiple Live Births   1               Home Medications    Prior to Admission medications   Medication Sig Start Date End Date Taking? Authorizing Provider  Cholecalciferol (VITAMIN D3) 5000 UNITS CAPS Take 1 capsule (5,000 Units total) by mouth daily. 09/01/14  Yes Plonk, Chrissie Noa, MD  cyanocobalamin 500 MCG tablet Take 1 tablet (500 mcg total) by mouth daily. 09/17/16  Yes Plonk, Chrissie Noa, MD  DULoxetine (CYMBALTA) 60 MG capsule Take 2 capsules (120 mg total) by mouth daily. 08/07/16 08/07/17 Yes Plonk, Chrissie Noa, MD  fluticasone (FLONASE) 50 MCG/ACT nasal spray Place 2 sprays into both nostrils daily. 09/17/16  Yes Lutricia Feil, PA-C  sodium chloride (OCEAN) 0.65 % SOLN nasal spray Place 1 spray into both nostrils as needed for congestion.   Yes [provider]  traZODone (DESYREL) 50 MG tablet Take 1 tablet (50 mg total) by mouth at bedtime. 11/05/16  Yes Plonk, Chrissie Noa, MD  albuterol (PROVENTIL HFA;VENTOLIN HFA) 108 (90 Base) MCG/ACT inhaler Inhale 2 puffs  into the lungs every 4 (four) hours as needed for wheezing or shortness of breath. Dispense with aerochamber 05/13/16   Domenick Gong, MD  azithromycin (ZITHROMAX Z-PAK) 250 MG tablet 2 tabs po once day 1, then 1 tab po qd for next 4 days 11/26/16   Payton Mccallum, MD  valACYclovir (VALTREX) 1000 MG tablet Take 1 tablet (1,000 mg total) by mouth daily. 07/08/16   Schuyler Amor, MD    Family History Family History  Problem Relation Age of Onset  . Cancer Mother   . Lymphoma Mother   . CAD Father   . Breast cancer Sister 59    Social History Social History  Substance Use Topics  . Smoking status: Never Smoker  . Smokeless tobacco: Never Used  . Alcohol use No     Allergies   Amoxicillin-pot clavulanate and  Penicillins   Review of Systems Review of Systems  Constitutional: Positive for fatigue.  HENT: Positive for congestion, ear pain and sinus pain.      Physical Exam Triage Vital Signs ED Triage Vitals  Enc Vitals Group     BP 11/26/16 1414 (!) 97/59     Pulse Rate 11/26/16 1409 81     Resp 11/26/16 1409 16     Temp 11/26/16 1409 97.6 F (36.4 C)     Temp Source 11/26/16 1409 Oral     SpO2 11/26/16 1409 99 %     Weight 11/26/16 1413 230 lb (104.3 kg)     Height 11/26/16 1413  (1.727 m)     Head Circumference --      Peak Flow --      Pain Score 11/26/16 1413 9     Pain Loc --      Pain Edu? --      Excl. in GC? --    No data found.   Updated Vital Signs BP (!) 97/59 (BP Location: Right Arm)   Pulse 81   Temp 97.6 F (36.4 C) (Oral)   Resp 16   Ht  (1.727 m)   Wt 230 lb (104.3 kg)   SpO2 99%   BMI 34.97 kg/m   Visual Acuity Right Eye Distance:   Left Eye Distance:   Bilateral Distance:    Right Eye Near:   Left Eye Near:    Bilateral Near:     Physical Exam  Constitutional: She appears well-developed and well-nourished. No distress.  HENT:  Head: Normocephalic and atraumatic.  Right Ear: Tympanic membrane, external ear and ear canal normal.  Left Ear: Tympanic membrane, external ear and ear canal normal.  Nose: Mucosal edema and rhinorrhea present. No nose lacerations, sinus tenderness, nasal deformity, septal deviation or nasal septal hematoma. No epistaxis.  No foreign bodies. Right sinus exhibits maxillary sinus tenderness and frontal sinus tenderness. Left sinus exhibits maxillary sinus tenderness. Left sinus exhibits no frontal sinus tenderness.  Mouth/Throat: Uvula is midline, oropharynx is clear and moist and mucous membranes are normal. No oropharyngeal exudate.  Eyes: Pupils are equal, round, and reactive to light. Conjunctivae and EOM are normal. Right eye exhibits no discharge. Left eye exhibits no discharge. No scleral icterus.  Neck:  Normal range of motion. Neck supple. No thyromegaly present.  Cardiovascular: Normal rate, regular rhythm and normal heart sounds.   Pulmonary/Chest: Effort normal and breath sounds normal. No respiratory distress. She has no wheezes. She has no rales.  Lymphadenopathy:    She has no cervical adenopathy.  Skin: She is not diaphoretic.  Nursing note and vitals reviewed.    UC Treatments / Results  Labs (all labs ordered are listed, but only abnormal results are displayed) Labs Reviewed - No data to display  EKG  EKG Interpretation None       Radiology No results found.  Procedures Procedures (including critical care time)  Medications Ordered in UC Medications - No data to display   Initial Impression / Assessment and Plan / UC Course  I have reviewed the triage vital signs and the nursing notes.  Pertinent labs & imaging results that were available during my care of the patient were reviewed by me and considered in my medical decision making (see chart for details).       Final Clinical Impressions(s) / UC Diagnoses   Final diagnoses:  Acute maxillary sinusitis, recurrence not specified    New Prescriptions Discharge Medication List as of 11/26/2016  2:46 PM    START taking these medications   Details  azithromycin (ZITHROMAX Z-PAK) 250 MG tablet 2 tabs po once day 1, then 1 tab po qd for next 4 days, Normal       1. diagnosis reviewed with patient/parent/guardian/family 2. rx as per orders above; reviewed possible side effects, interactions, risks and benefits  3. Recommend supportive treatment with otc flonase 4. Follow-up prn if symptoms worsen or don't improve   Controlled Substance Prescriptions [place] Controlled Substance Registry consulted? Not Applicable   Payton Mccallum, MD 11/26/16 1454

## 2016-11-26 NOTE — ED Triage Notes (Signed)
Patient in today c/o ear pain/pressure and sinus pain/pressure x 3 weeks. Patient has had sinus surgery 5 years ago. Patient has tried saline spray without relief.

## 2016-12-23 DIAGNOSIS — J329 Chronic sinusitis, unspecified: Secondary | ICD-10-CM | POA: Diagnosis not present

## 2017-01-20 ENCOUNTER — Ambulatory Visit (INDEPENDENT_AMBULATORY_CARE_PROVIDER_SITE_OTHER): Payer: Medicare Other

## 2017-01-20 VITALS — BP 110/58 | HR 76 | Temp 97.6°F | Resp 16 | Ht 68.0 in | Wt 233.4 lb

## 2017-01-20 DIAGNOSIS — Z1231 Encounter for screening mammogram for malignant neoplasm of breast: Secondary | ICD-10-CM

## 2017-01-20 DIAGNOSIS — Z0000 Encounter for general adult medical examination without abnormal findings: Secondary | ICD-10-CM | POA: Diagnosis not present

## 2017-01-20 DIAGNOSIS — Z1159 Encounter for screening for other viral diseases: Secondary | ICD-10-CM

## 2017-01-20 DIAGNOSIS — E2839 Other primary ovarian failure: Secondary | ICD-10-CM

## 2017-01-20 DIAGNOSIS — Z1239 Encounter for other screening for malignant neoplasm of breast: Secondary | ICD-10-CM

## 2017-01-20 NOTE — Progress Notes (Signed)
Subjective:   Ann Peters is a 66 y.o. female who presents for Medicare Annual (Subsequent) preventive examination.  Review of Systems:  N/A Cardiac Risk Factors include: advanced age (>63men, >50 women);obesity (BMI >30kg/m2);sedentary lifestyle     Objective:     Vitals: BP (!) 110/58 (BP Location: Right Arm, Patient Position: Sitting, Cuff Size: Large)   Pulse 76   Temp 97.6 F (36.4 C) (Oral)   Resp 16   Ht 5\' 8"  (1.727 m)   Wt 233 lb 6.4 oz (105.9 kg)   BMI 35.49 kg/m   Body mass index is 35.49 kg/m.  Advanced Directives 01/20/2017 11/05/2016 09/17/2016 09/28/2015 11/07/2014 09/01/2014  Does Patient Have a Medical Advance Directive? No No No No No No  Would patient like information on creating a medical advance directive? Yes (MAU/Ambulatory/Procedural Areas - Information given) - - Yes - Educational materials given - No - patient declined information    Tobacco Social History   Tobacco Use  Smoking Status Never Smoker  Smokeless Tobacco Never Used     Counseling given: Not Answered   Clinical Intake:  Pre-visit preparation completed: Yes  Pain : No/denies pain     BMI - recorded: 35.49 Nutritional Status: BMI > 30  Obese Nutritional Risks: None Diabetes: No  Activities of Daily Living: Independent Ambulation: Independent with device- listed below Home Assistive Devices/Equipment: Eyeglasses, Other (Comment), Shower/tub chair, Cane, Walker (specify Type), Wheelchair(grab bars in shower) Medication Administration: Independent Home Management: Independent  Barriers to Care Management & Learning: None  Do you feel unsafe in your current relationship?: No(married) Do you feel physically threatened by others?: No Anyone hurting you at home, work, or school?: No  How often do you need to have someone help you when you read instructions, pamphlets, or other written materials from your doctor or pharmacy?: 1 - Never What is the last grade level you  completed in school?: 12th grade  Interpreter Needed?: No  Information entered by :: AEversole, LPN  Past Medical History:  Diagnosis Date  . Anxiety   . Depression   . History of shingles    ON AND OFF 6 YEARS  . Neuromuscular disorder (HCC)    POSTHERPETIC NEURALGIA  . Pneumonia    HX   SEVERAL TIMES  NONE IN 3 YRS   Past Surgical History:  Procedure Laterality Date  . BLADDER REPAIR    . COLON SURGERY     part of colon removed  . COLONOSCOPY  2011   polyp removed- Dr Marva Panda  . NASAL SINUS SURGERY    . TONSILLECTOMY    . TOTAL HIP ARTHROPLASTY Left 10/10/2015   Procedure: LEFT TOTAL HIP ARTHROPLASTY ANTERIOR APPROACH;  Surgeon: Kathryne Hitch, MD;  Location: [facility] OR;  Service: Orthopedics;  Laterality: Left;  . TUBAL LIGATION    . VAGINAL HYSTERECTOMY     Family History  Problem Relation Age of Onset  . Cancer Mother   . Lymphoma Mother   . CAD Father   . Breast cancer Sister 74   Social History   Socioeconomic History  . Marital status: Married    Spouse name: None  . Number of children: 3  . Years of education: None  . Highest education level: None  Social Needs  . Financial resource strain: Not hard at all  . Food insecurity - worry: Never true  . Food insecurity - inability: Never true  . Transportation needs - medical: No  . Transportation needs - non-medical: No  Occupational History  . Occupation: Retired  Tobacco Use  . Smoking status: Never Smoker  . Smokeless tobacco: Never Used  Substance and Sexual Activity  . Alcohol use: No    Alcohol/week: 0.0 oz  . Drug use: No  . Sexual activity: None  Other Topics Concern  . None  Social History Narrative  . None    Outpatient Encounter Medications as of 01/20/2017  Medication Sig  . albuterol (PROVENTIL HFA;VENTOLIN HFA) 108 (90 Base) MCG/ACT inhaler Inhale 2 puffs into the lungs every 4 (four) hours as needed for wheezing or shortness of breath. Dispense with aerochamber  .  Cholecalciferol (VITAMIN D3) 5000 UNITS CAPS Take 1 capsule (5,000 Units total) by mouth daily.  . cyanocobalamin 500 MCG tablet Take 1 tablet (500 mcg total) by mouth daily.  . DULoxetine (CYMBALTA) 60 MG capsule Take 2 capsules (120 mg total) by mouth daily.  . fluticasone (FLONASE) 50 MCG/ACT nasal spray Place 2 sprays into both nostrils daily.  . sodium chloride (OCEAN) 0.65 % SOLN nasal spray Place 1 spray into both nostrils as needed for congestion.  . traZODone (DESYREL) 50 MG tablet Take 1 tablet (50 mg total) by mouth at bedtime.  . valACYclovir (VALTREX) 1000 MG tablet Take 1 tablet (1,000 mg total) by mouth daily.  Marland Kitchen. azithromycin (ZITHROMAX Z-PAK) 250 MG tablet 2 tabs po once day 1, then 1 tab po qd for next 4 days (Patient not taking: Reported on 01/20/2017)   No facility-administered encounter medications on file as of 01/20/2017.     Activities of Daily Living In your present state of health, do you have any difficulty performing the following activities: 01/20/2017 10/18/2016  Hearing? Y N  Comment pain in R ear. Sees Dr. Genevive BiJuengle, ENT -  Vision? N N  Difficulty concentrating or making decisions? N N  Walking or climbing stairs? N N  Dressing or bathing? N N  Doing errands, shopping? N N  Preparing Food and eating ? N -  Using the Toilet? N -  In the past six months, have you accidently leaked urine? N -  Do you have problems with loss of bowel control? N -  Managing your Medications? N -  Managing your Finances? N -  Housekeeping or managing your Housekeeping? N -  Some recent data might be hidden    Timed Get Up and Go performed: Yes. 10 Sec. Not deemed high risk for fall. No intervention required  Patient Care Team: Schuyler AmorPlonk, William, MD as PCP - General (Family Medicine) Vernie MurdersJuengel, Paul, MD as Consulting Physician (Otolaryngology)    Assessment:    Exercise Activities and Dietary recommendations Current Exercise Habits: The patient does not participate in regular  exercise at present, Exercise limited by: None identified  Goals    . Exercise 150 min/wk Moderate Activity     Recommend to exercise 3-5 times per week, approx 150 minutes per week      Fall Risk Fall Risk  01/20/2017 11/05/2016 10/18/2016 04/17/2016 05/08/2015  Falls in the past year? No No No No Yes  Number falls in past yr: - - - - 1  Comment - - - - fell at work  Injury with Fall? - - - - No   Is the patient's home free of loose throw rugs in walkways, pet beds, electrical cords, etc?   yes      Grab bars in the bathroom? yes      Handrails on the stairs?   yes  Adequate lighting?   yes  Depression Screen PHQ 2/9 Scores 01/20/2017 01/20/2017 11/05/2016 10/18/2016  PHQ - 2 Score 1 1 0 6  PHQ- 9 Score 10 - 1 16    Pt expressed interest in changing her antidepressant. States she was previously taking Wellbutrin and felt she had a higher level of functioning while taking this medication. Has a follow up appt scheduled with Dr. Hollace HaywardPlonk on 01/29/17 to address her concerns.   Cognitive Function     6CIT Screen 01/20/2017  What Year? 0 points  What month? 0 points  What time? 0 points  Count back from 20 0 points  Months in reverse 0 points  Repeat phrase 4 points  Total Score 4    Immunization History  Administered Date(s) Administered  . Influenza, High Dose Seasonal PF 11/05/2016  . Influenza,inj,Quad PF,6+ Mos 01/03/2015, 01/22/2016  . Pneumococcal Conjugate-13 10/03/2014  . Pneumococcal Polysaccharide-23 05/08/2015  . Tdap 10/03/2014  . Zoster 05/30/2011   Screening Tests Health Maintenance  Topic Date Due  . Hepatitis C Screening  01/18/1951  . DEXA SCAN  07/26/2014  . MAMMOGRAM  02/01/2017  . COLONOSCOPY  04/17/2021  . TETANUS/TDAP  10/02/2024  . INFLUENZA VACCINE  Completed  . PNA vac Low Risk Adult  Completed   Cancer Screenings: Lung: Low Dose CT Chest recommended if Age 21-80 years, 30 pack-year currently smoking OR have quit w/in 15years. Patient does not  qualify.  Breast: Up to date on Mammogram? Yes  Completed 02/15/16. Repeat mammogram due on or after 02/14/17. Mammogram ordered today. Pt provided with contact information to schedule her appt on or after 02/14/17.   Up to date of Bone Density/Dexa? No Ordered today. Pt is aware that she will receive a call from our office regarding her appointment date and time.  Colorectal: Unable to locate a colonoscopy report. Pt is also unable to recall when she completed her most recent colonoscopy. Advised pt to provide a copy of her colonoscopy report to determine frequency of repeat colon cancer screenings.  Additional Screenings: HIV: no required Hepatitis C Screening: Ordered today. Pt given lab requisition today to complete       Plan:  I have personally reviewed and addressed the Medicare Annual Wellness questionnaire and have noted the following in the patient's chart:  A. Medical and social history B. Use of alcohol, tobacco or illicit drugs  C. Current medications and supplements D. Functional ability and status E.  Nutritional status F.  Physical activity G. Advance directives H. List of other physicians I.  Hospitalizations, surgeries, and ER visits in previous 12 months J.  Vitals K. Screenings such as hearing and vision if needed, cognitive and depression L. Referrals and appointments - none  In addition, I have reviewed and discussed with patient certain preventive protocols, quality metrics, and best practice recommendations. A written personalized care plan for preventive services as well as general preventive health recommendations were provided to patient.  See attached scanned questionnaire for additional information.   Signed,  Deon PillingAmmie Adrian Dinovo, LPN Nurse Health Advisor  MD Recommendations:

## 2017-01-20 NOTE — Patient Instructions (Addendum)
Ann Peters , Thank you for taking time to come for your Medicare Wellness Visit. I appreciate your ongoing commitment to your health goals. Please review the following plan we discussed and let me know if I can assist you in the future.   Screening recommendations/referrals: Colonoscopy: Unable to determine when you have completed this exam and you are unable to recall when you have completed this exam as well. Please provide a copy of your most recent colonoscopy report. Will need this report to determine how often you will need to complete colon cancer screenings. Mammogram: Completed 02/15/16. Repeat mammogram every year. Ordered today. Please call [phone] to schedule your mammogram.  Bone Density: Ordered today. Our office will call you with appointment date and time. Recommended yearly ophthalmology/optometry visit for glaucoma screening and checkup. You are over due to complete an eye exam. Please call your eye doctor to schedule an appointment. Recommended yearly dental visit for hygiene and checkup  Vaccinations: Influenza vaccine: Completed 11/05/16 Pneumococcal vaccine: Completed PCV13 on 10/03/14 and PPSV23 on 05/08/15 Tdap vaccine: Completed 10/03/14 Shingles vaccine: Completed 05/30/11    Advanced directives: Advance directive discussed with you today. I have provided a copy for you to complete at home and have notarized. Once this is complete please bring a copy in to our office so we can scan it into your chart.  Conditions/risks identified: Recommend to exercise 3-5 times per week, approx 150 minutes per week  Next appointment: You are scheduled to see Dr. Hollace Hayward on 01/29/17 @ 2:45pm.   Please schedule your annual wellness exam with your Nurse Health Advisor in one year.   Preventive Care 27 Years and Older, Female Preventive care refers to lifestyle choices and visits with your health care provider that can promote health and wellness. What does preventive care include?  A  yearly physical exam. This is also called an annual well check.  Dental exams once or twice a year.  Routine eye exams. Ask your health care provider how often you should have your eyes checked.  Personal lifestyle choices, including:  Daily care of your teeth and gums.  Regular physical activity.  Eating a healthy diet.  Avoiding tobacco and drug use.  Limiting alcohol use.  Practicing safe sex.  Taking low-dose aspirin every day.  Taking vitamin and mineral supplements as recommended by your health care provider. What happens during an annual well check? The services and screenings done by your health care provider during your annual well check will depend on your age, overall health, lifestyle risk factors, and family history of disease. Counseling  Your health care provider may ask you questions about your:  Alcohol use.  Tobacco use.  Drug use.  Emotional well-being.  Home and relationship well-being.  Sexual activity.  Eating habits.  History of falls.  Memory and ability to understand (cognition).  Work and work Astronomer.  Reproductive health. Screening  You may have the following tests or measurements:  Height, weight, and BMI.  Blood pressure.  Lipid and cholesterol levels. These may be checked every 5 years, or more frequently if you are over 21 years old.  Skin check.  Lung cancer screening. You may have this screening every year starting at age 18 if you have a 30-pack-year history of smoking and currently smoke or have quit within the past 15 years.  Fecal occult blood test (FOBT) of the stool. You may have this test every year starting at age 15.  Flexible sigmoidoscopy or colonoscopy. You may have  a sigmoidoscopy every 5 years or a colonoscopy every 10 years starting at age 67.  Hepatitis C blood test.  Hepatitis B blood test.  Sexually transmitted disease (STD) testing.  Diabetes screening. This is done by checking your blood  sugar (glucose) after you have not eaten for a while (fasting). You may have this done every 1-3 years.  Bone density scan. This is done to screen for osteoporosis. You may have this done starting at age 67.  Mammogram. This may be done every 1-2 years. Talk to your health care provider about how often you should have regular mammograms. Talk with your health care provider about your test results, treatment options, and if necessary, the need for more tests. Vaccines  Your health care provider may recommend certain vaccines, such as:  Influenza vaccine. This is recommended every year.  Tetanus, diphtheria, and acellular pertussis (Tdap, Td) vaccine. You may need a Td booster every 10 years.  Zoster vaccine. You may need this after age 67.  Pneumococcal 13-valent conjugate (PCV13) vaccine. One dose is recommended after age 67.  Pneumococcal polysaccharide (PPSV23) vaccine. One dose is recommended after age 67. Talk to your health care provider about which screenings and vaccines you need and how often you need them. This information is not intended to replace advice given to you by your health care provider. Make sure you discuss any questions you have with your health care provider. Document Released: 03/03/2015 Document Revised: 10/25/2015 Document Reviewed: 12/06/2014 Elsevier Interactive Patient Education  2017 [employer]  Fall Prevention in the Home Falls can cause injuries. They can happen to people of all ages. There are many things you can do to make your home safe and to help prevent falls. What can I do on the outside of my home?  Regularly fix the edges of walkways and driveways and fix any cracks.  Remove anything that might make you trip as you walk through a door, such as a raised step or threshold.  Trim any bushes or trees on the path to your home.  Use bright outdoor lighting.  Clear any walking paths of anything that might make someone trip, such as rocks or  tools.  Regularly check to see if handrails are loose or broken. Make sure that both sides of any steps have handrails.  Any raised decks and porches should have guardrails on the edges.  Have any leaves, snow, or ice cleared regularly.  Use sand or salt on walking paths during winter.  Clean up any spills in your garage right away. This includes oil or grease spills. What can I do in the bathroom?  Use night lights.  Install grab bars by the toilet and in the tub and shower. Do not use towel bars as grab bars.  Use non-skid mats or decals in the tub or shower.  If you need to sit down in the shower, use a plastic, non-slip stool.  Keep the floor dry. Clean up any water that spills on the floor as soon as it happens.  Remove soap buildup in the tub or shower regularly.  Attach bath mats securely with double-sided non-slip rug tape.  Do not have throw rugs and other things on the floor that can make you trip. What can I do in the bedroom?  Use night lights.  Make sure that you have a light by your bed that is easy to reach.  Do not use any sheets or blankets that are too big for your bed.  They should not hang down onto the floor.  Have a firm chair that has side arms. You can use this for support while you get dressed.  Do not have throw rugs and other things on the floor that can make you trip. What can I do in the kitchen?  Clean up any spills right away.  Avoid walking on wet floors.  Keep items that you use a lot in easy-to-reach places.  If you need to reach something above you, use a strong step stool that has a grab bar.  Keep electrical cords out of the way.  Do not use floor polish or wax that makes floors slippery. If you must use wax, use non-skid floor wax.  Do not have throw rugs and other things on the floor that can make you trip. What can I do with my stairs?  Do not leave any items on the stairs.  Make sure that there are handrails on both  sides of the stairs and use them. Fix handrails that are broken or loose. Make sure that handrails are as long as the stairways.  Check any carpeting to make sure that it is firmly attached to the stairs. Fix any carpet that is loose or worn.  Avoid having throw rugs at the top or bottom of the stairs. If you do have throw rugs, attach them to the floor with carpet tape.  Make sure that you have a light switch at the top of the stairs and the bottom of the stairs. If you do not have them, ask someone to add them for you. What else can I do to help prevent falls?  Wear shoes that:  Do not have high heels.  Have rubber bottoms.  Are comfortable and fit you well.  Are closed at the toe. Do not wear sandals.  If you use a stepladder:  Make sure that it is fully opened. Do not climb a closed stepladder.  Make sure that both sides of the stepladder are locked into place.  Ask someone to hold it for you, if possible.  Clearly mark and make sure that you can see:  Any grab bars or handrails.  First and last steps.  Where the edge of each step is.  Use tools that help you move around (mobility aids) if they are needed. These include:  Canes.  Walkers.  Scooters.  Crutches.  Turn on the lights when you go into a dark area. Replace any light bulbs as soon as they burn out.  Set up your furniture so you have a clear path. Avoid moving your furniture around.  If any of your floors are uneven, fix them.  If there are any pets around you, be aware of where they are.  Review your medicines with your doctor. Some medicines can make you feel dizzy. This can increase your chance of falling. Ask your doctor what other things that you can do to help prevent falls. This information is not intended to replace advice given to you by your health care provider. Make sure you discuss any questions you have with your health care provider. Document Released: 12/01/2008 Document Revised:  07/13/2015 Document Reviewed: 03/11/2014 Elsevier Interactive Patient Education  2017 [employer]

## 2017-01-21 LAB — HEPATITIS C ANTIBODY: Hep C Virus Ab: <0.1 s/co ratio (ref 0.0–0.9)

## 2017-01-24 ENCOUNTER — Ambulatory Visit: Payer: Self-pay | Admitting: Family Medicine

## 2017-01-29 ENCOUNTER — Ambulatory Visit: Payer: Self-pay | Admitting: Family Medicine

## 2017-02-17 ENCOUNTER — Ambulatory Visit (INDEPENDENT_AMBULATORY_CARE_PROVIDER_SITE_OTHER): Payer: Medicare Other | Admitting: Family Medicine

## 2017-02-17 ENCOUNTER — Encounter: Payer: Self-pay | Admitting: Family Medicine

## 2017-02-17 VITALS — BP 122/84 | HR 77 | Temp 97.7°F | Resp 16 | Ht 68.0 in | Wt 234.0 lb

## 2017-02-17 DIAGNOSIS — G4700 Insomnia, unspecified: Secondary | ICD-10-CM

## 2017-02-17 DIAGNOSIS — R7303 Prediabetes: Secondary | ICD-10-CM | POA: Diagnosis not present

## 2017-02-17 DIAGNOSIS — E669 Obesity, unspecified: Secondary | ICD-10-CM

## 2017-02-17 DIAGNOSIS — E559 Vitamin D deficiency, unspecified: Secondary | ICD-10-CM | POA: Diagnosis not present

## 2017-02-17 DIAGNOSIS — E538 Deficiency of other specified B group vitamins: Secondary | ICD-10-CM | POA: Diagnosis not present

## 2017-02-17 DIAGNOSIS — D649 Anemia, unspecified: Secondary | ICD-10-CM | POA: Insufficient documentation

## 2017-02-17 DIAGNOSIS — F339 Major depressive disorder, recurrent, unspecified: Secondary | ICD-10-CM

## 2017-02-17 DIAGNOSIS — M67442 Ganglion, left hand: Secondary | ICD-10-CM | POA: Diagnosis not present

## 2017-02-17 HISTORY — DX: Anemia, unspecified: D64.9

## 2017-02-17 MED ORDER — TRAZODONE HCL 50 MG PO TABS: 50.0000 mg | ORAL_TABLET | Freq: Every evening | ORAL | 2 refills | Status: DC | PRN

## 2017-02-17 NOTE — Progress Notes (Signed)
Date:  02/17/2017   Name:  Ann Peters   DOB:  1950-07-05   MRN:  [id number]  PCP:  Schuyler Amor, MD    Chief Complaint: Cyst (left hand cyst in palm started 3 months ago getting worse and starting to hurt. ) and Ear Pain (Right ear pain Juenggle wrote oral Abx and treated sinus inf few weeks ago. )   History of Present Illness:  This is a 66 y.o. female seen for 3 month f/u. Feels Cymbalta controlling depression only marginally, wants to see [facility] psychiatry to discuss further options. Using trazodone prn for insomnia, about 3x/wk. Off Valtrex without zoster recurrence. Seeing ENT for recurrent sinusitis, currently on Levaquin. C/o cyst L hand growing x 6 months, starting to hurt and affect grip. S/p colon resection, on high dose vit D, decreased B12 supp, last blood work showed anemia.  Review of Systems:  Review of Systems  Constitutional: Negative for chills and fever.  Respiratory: Negative for cough and shortness of breath.   Cardiovascular: Negative for chest pain and leg swelling.  Genitourinary: Negative for difficulty urinating.  Neurological: Negative for syncope and light-headedness.    Patient Active Problem List   Diagnosis Date Noted  . Anemia 02/17/2017  . Insomnia 11/05/2016  . Sinusitis 10/18/2016  . Osteoarthritis of left hip 10/10/2015  . Status post left hip replacement 10/10/2015  . Onychomycosis of left great toe 05/10/2015  . Prediabetes 01/03/2015  . Diverticulitis 09/01/2014  . Vitamin D deficiency 09/01/2014  . Herpes zoster 09/01/2014  . FH: diabetes mellitus 09/01/2014  . FHx: heart disease 09/01/2014  . FH: lymphoma 09/01/2014  . Obesity, Class II, BMI 35-39.9, no comorbidity 09/01/2014  . Recurrent major depression resistant to treatment (HCC) 11/15/2013  . Fibromyalgia 11/15/2013  . Vitamin B12 deficiency 12/23/2011  . Allergic rhinitis 04/18/2011    Prior to Admission medications   Medication Sig Start Date End Date Taking? Authorizing  Provider  albuterol (PROVENTIL HFA;VENTOLIN HFA) 108 (90 Base) MCG/ACT inhaler Inhale 2 puffs into the lungs every 4 (four) hours as needed for wheezing or shortness of breath. Dispense with aerochamber 05/13/16  Yes Domenick Gong, MD  Cholecalciferol (VITAMIN D3) 5000 UNITS CAPS Take 1 capsule (5,000 Units total) by mouth daily. 09/01/14  Yes Yazan Gatling, Chrissie Noa, MD  cyanocobalamin 500 MCG tablet Take 1 tablet (500 mcg total) by mouth daily. 09/17/16  Yes Dlynn Ranes, Chrissie Noa, MD  DULoxetine (CYMBALTA) 60 MG capsule Take 2 capsules (120 mg total) by mouth daily. 08/07/16 08/07/17 Yes Dewaine Morocho, Chrissie Noa, MD  fluticasone (FLONASE) 50 MCG/ACT nasal spray Place 2 sprays into both nostrils daily. 09/17/16  Yes Lutricia Feil, PA-C  Multiple Vitamins-Minerals (MULTIVITAMIN WOMEN 50+) TABS Take 1 tablet by mouth daily.   Yes [provider]  sodium chloride (OCEAN) 0.65 % SOLN nasal spray Place 1 spray into both nostrils as needed for congestion.   Yes [provider]  traZODone (DESYREL) 50 MG tablet Take 1 tablet (50 mg total) by mouth at bedtime as needed for sleep. 02/17/17  Yes Buddy Loeffelholz, Chrissie Noa, MD    Allergies  Allergen Reactions  . Amoxicillin-Pot Clavulanate Other (See Comments)    UNSPECIFIED   . Penicillins Nausea And Vomiting    Has patient had a PCN reaction causing immediate rash, facial/tongue/throat swelling, SOB or lightheadedness with hypotension: no Has patient had a PCN reaction causing severe rash involving mucus membranes or skin necrosis: No Has patient had a PCN reaction that required hospitalization No Has patient had a PCN  reaction occurring within the last 10 years: Yes If all of the above answers are "NO", then may proceed with Cephalosporin use.     Past Surgical History:  Procedure Laterality Date  . BLADDER REPAIR    . COLON SURGERY     part of colon removed  . COLONOSCOPY  2011   polyp removed- Dr Marva PandaSkulskie  . NASAL SINUS SURGERY    . TONSILLECTOMY    .  TOTAL HIP ARTHROPLASTY Left 10/10/2015   Procedure: LEFT TOTAL HIP ARTHROPLASTY ANTERIOR APPROACH;  Surgeon: Kathryne Hitchhristopher Y Blackman, MD;  Location: [facility] OR;  Service: Orthopedics;  Laterality: Left;  . TUBAL LIGATION    . VAGINAL HYSTERECTOMY      Social History   Tobacco Use  . Smoking status: Never Smoker  . Smokeless tobacco: Never Used  Substance Use Topics  . Alcohol use: No    Alcohol/week: 0.0 oz  . Drug use: No    Family History  Problem Relation Age of Onset  . Cancer Mother   . Lymphoma Mother   . CAD Father   . Breast cancer Sister [id number]    Medication list has been reviewed and updated.  Physical Examination: BP 122/84   Pulse 77   Temp 97.7 F (36.5 C)   Resp 16   Ht 5\' 8"  (1.727 m)   Wt 234 lb (106.1 kg)   SpO2 97%   BMI 35.58 kg/m   Physical Exam  Constitutional: She appears well-developed and well-nourished.  Cardiovascular: Normal rate, regular rhythm and normal heart sounds.  Pulmonary/Chest: Effort normal and breath sounds normal.  Musculoskeletal: She exhibits no edema.  1.5 cm cyst over 4th flexor tendon L hand  Neurological: She is alert.  Skin: Skin is warm and dry.  Psychiatric: She has a normal mood and affect. Her behavior is normal.  Nursing note and vitals reviewed.   Assessment and Plan:  1. Recurrent major depression resistant to treatment (HCC) Marginal control, cont Cymbalta, refer psych - Comprehensive Metabolic Panel (CMET) - TSH - Ambulatory referral to Psychiatry  2. Ganglion cyst of flexor tendon sheath of finger of left hand - Ambulatory referral to Orthopedic Surgery  3. Insomnia, unspecified type Cont trazodone prn, discuss further with psych  4. Prediabetes - HgB A1c  5. Anemia, unspecified type - CBC - Ferritin  6. Vitamin B12 deficiency On decreased supplement - B12  7. Vitamin D deficiency On high dose supplement - Vitamin D (25 hydroxy)  8. Obesity, Class II, BMI 35-39.9, no comorbidity Weight  stable, exercise/weight loss discussed  Return in about 6 months (around 08/17/2017).  [name] [name], Jr. MD [facility]  02/17/2017

## 2017-02-18 LAB — CBC
HEMATOCRIT: 39.9 % (ref 34.0–46.6)
Hemoglobin: 14 g/dL (ref 11.1–15.9)
MCH: 32.2 pg (ref 26.6–33.0)
MCHC: 35.1 g/dL (ref 31.5–35.7)
MCV: 92 fL (ref 79–97)
PLATELETS: 327 10*3/uL (ref 150–379)
RBC: 4.35 x10E6/uL (ref 3.77–5.28)
RDW: 15 % (ref 12.3–15.4)
WBC: 5.5 10*3/uL (ref 3.4–10.8)

## 2017-02-18 LAB — COMPREHENSIVE METABOLIC PANEL
A/G RATIO: 1.4 (ref 1.2–2.2)
ALBUMIN: 4.4 g/dL (ref 3.6–4.8)
ALK PHOS: 99 IU/L (ref 39–117)
ALT: 13 IU/L (ref 0–32)
AST: 19 IU/L (ref 0–40)
BILIRUBIN TOTAL: 0.5 mg/dL (ref 0.0–1.2)
BUN / CREAT RATIO: 23 (ref 12–28)
BUN: 20 mg/dL (ref 8–27)
CHLORIDE: 104 mmol/L (ref 96–106)
CO2: 22 mmol/L (ref 20–29)
CREATININE: 0.88 mg/dL (ref 0.57–1.00)
Calcium: 9.7 mg/dL (ref 8.7–10.3)
GFR calc Af Amer: 79 mL/min/{1.73_m2} (ref >59)
GFR calc non Af Amer: 68 mL/min/{1.73_m2} (ref >59)
GLOBULIN, TOTAL: 3.1 g/dL (ref 1.5–4.5)
Glucose: 92 mg/dL (ref 65–99)
POTASSIUM: 4.7 mmol/L (ref 3.5–5.2)
SODIUM: 141 mmol/L (ref 134–144)
Total Protein: 7.5 g/dL (ref 6.0–8.5)

## 2017-02-18 LAB — HEMOGLOBIN A1C
Est. average glucose Bld gHb Est-mCnc: 114 mg/dL
Hgb A1c MFr Bld: 5.6 % (ref 4.8–5.6)

## 2017-02-18 LAB — TSH: TSH: 3.06 u[IU]/mL (ref 0.450–4.500)

## 2017-02-18 LAB — VITAMIN B12

## 2017-02-18 LAB — VITAMIN D 25 HYDROXY (VIT D DEFICIENCY, FRACTURES): VIT D 25 HYDROXY: 30.3 ng/mL (ref 30.0–100.0)

## 2017-02-18 LAB — FERRITIN: Ferritin: 68 ng/mL (ref 15–150)

## 2017-02-19 ENCOUNTER — Other Ambulatory Visit: Payer: Self-pay | Admitting: Family Medicine

## 2017-02-20 ENCOUNTER — Ambulatory Visit
Admission: RE | Admit: 2017-02-20 | Discharge: 2017-02-20 | Disposition: A | Discharge status: Home / Self Care | Payer: Medicare Other | Source: Ambulatory Visit | Attending: Family Medicine | Admitting: Family Medicine

## 2017-02-20 DIAGNOSIS — E2839 Other primary ovarian failure: Secondary | ICD-10-CM | POA: Insufficient documentation

## 2017-02-20 DIAGNOSIS — Z1239 Encounter for other screening for malignant neoplasm of breast: Secondary | ICD-10-CM

## 2017-02-20 DIAGNOSIS — Z13820 Encounter for screening for osteoporosis: Secondary | ICD-10-CM | POA: Insufficient documentation

## 2017-02-20 DIAGNOSIS — Z1231 Encounter for screening mammogram for malignant neoplasm of breast: Secondary | ICD-10-CM | POA: Diagnosis not present

## 2017-02-20 DIAGNOSIS — Z780 Asymptomatic menopausal state: Secondary | ICD-10-CM | POA: Diagnosis not present

## 2017-02-26 DIAGNOSIS — M67442 Ganglion, left hand: Secondary | ICD-10-CM | POA: Diagnosis not present

## 2017-02-26 DIAGNOSIS — M720 Palmar fascial fibromatosis [Dupuytren]: Secondary | ICD-10-CM | POA: Diagnosis not present

## 2017-02-26 DIAGNOSIS — M85442 Solitary bone cyst, left hand: Secondary | ICD-10-CM | POA: Diagnosis not present

## 2017-02-26 DIAGNOSIS — E669 Obesity, unspecified: Secondary | ICD-10-CM | POA: Diagnosis not present

## 2017-03-12 DIAGNOSIS — E669 Obesity, unspecified: Secondary | ICD-10-CM | POA: Diagnosis not present

## 2017-03-12 DIAGNOSIS — M720 Palmar fascial fibromatosis [Dupuytren]: Secondary | ICD-10-CM | POA: Diagnosis not present

## 2017-03-19 ENCOUNTER — Encounter: Payer: Self-pay | Admitting: *Deleted

## 2017-03-19 ENCOUNTER — Other Ambulatory Visit: Payer: Self-pay

## 2017-03-26 DIAGNOSIS — F3340 Major depressive disorder, recurrent, in remission, unspecified: Secondary | ICD-10-CM | POA: Diagnosis not present

## 2017-03-28 ENCOUNTER — Ambulatory Visit: Payer: Medicare Other | Admitting: Anesthesiology

## 2017-03-28 ENCOUNTER — Encounter
Admission: RE | Disposition: A | Discharge status: Home / Self Care | Payer: Self-pay | Source: Ambulatory Visit | Attending: Unknown Physician Specialty

## 2017-03-28 ENCOUNTER — Ambulatory Visit
Admission: RE | Admit: 2017-03-28 | Discharge: 2017-03-28 | Disposition: A | Discharge status: Home / Self Care | Payer: Medicare Other | Source: Ambulatory Visit | Attending: Unknown Physician Specialty | Admitting: Unknown Physician Specialty

## 2017-03-28 DIAGNOSIS — R7303 Prediabetes: Secondary | ICD-10-CM | POA: Insufficient documentation

## 2017-03-28 DIAGNOSIS — M720 Palmar fascial fibromatosis [Dupuytren]: Secondary | ICD-10-CM | POA: Insufficient documentation

## 2017-03-28 DIAGNOSIS — Z96642 Presence of left artificial hip joint: Secondary | ICD-10-CM | POA: Insufficient documentation

## 2017-03-28 DIAGNOSIS — F418 Other specified anxiety disorders: Secondary | ICD-10-CM | POA: Insufficient documentation

## 2017-03-28 DIAGNOSIS — E669 Obesity, unspecified: Secondary | ICD-10-CM | POA: Diagnosis not present

## 2017-03-28 DIAGNOSIS — I10 Essential (primary) hypertension: Secondary | ICD-10-CM | POA: Insufficient documentation

## 2017-03-28 DIAGNOSIS — Z6835 Body mass index (BMI) 35.0-35.9, adult: Secondary | ICD-10-CM | POA: Diagnosis not present

## 2017-03-28 DIAGNOSIS — Z79899 Other long term (current) drug therapy: Secondary | ICD-10-CM | POA: Diagnosis not present

## 2017-03-28 DIAGNOSIS — Z791 Long term (current) use of non-steroidal anti-inflammatories (NSAID): Secondary | ICD-10-CM | POA: Diagnosis not present

## 2017-03-28 DIAGNOSIS — M1611 Unilateral primary osteoarthritis, right hip: Secondary | ICD-10-CM | POA: Diagnosis not present

## 2017-03-28 DIAGNOSIS — F329 Major depressive disorder, single episode, unspecified: Secondary | ICD-10-CM | POA: Insufficient documentation

## 2017-03-28 DIAGNOSIS — Z880 Allergy status to penicillin: Secondary | ICD-10-CM | POA: Insufficient documentation

## 2017-03-28 DIAGNOSIS — Z7951 Long term (current) use of inhaled steroids: Secondary | ICD-10-CM | POA: Insufficient documentation

## 2017-03-28 DIAGNOSIS — Z9049 Acquired absence of other specified parts of digestive tract: Secondary | ICD-10-CM | POA: Insufficient documentation

## 2017-03-28 HISTORY — PX: TRIGGER FINGER RELEASE: SHX641

## 2017-03-28 SURGERY — MINOR RELEASE TRIGGER FINGER/A-1 PULLEY
Anesthesia: General | Site: Hand | Laterality: Left | Wound class: Clean

## 2017-03-28 MED ORDER — LIDOCAINE HCL (CARDIAC) 20 MG/ML IV SOLN
INTRAVENOUS | Status: DC | PRN
  Administered 2017-03-28: 40 mg via INTRATRACHEAL

## 2017-03-28 MED ORDER — MIDAZOLAM HCL 5 MG/5ML IJ SOLN
INTRAMUSCULAR | Status: DC | PRN
  Administered 2017-03-28: 2 mg via INTRAVENOUS

## 2017-03-28 MED ORDER — OXYCODONE HCL 5 MG/5ML PO SOLN: 5.0000 mg | Freq: Once | ORAL | Status: DC | PRN

## 2017-03-28 MED ORDER — FENTANYL CITRATE (PF) 100 MCG/2ML IJ SOLN
INTRAMUSCULAR | Status: DC | PRN
  Administered 2017-03-28: 25 ug via INTRAVENOUS
  Administered 2017-03-28: 50 ug via INTRAVENOUS
  Administered 2017-03-28: 25 ug via INTRAVENOUS

## 2017-03-28 MED ORDER — ACETAMINOPHEN 10 MG/ML IV SOLN: 1000.0000 mg | Freq: Once | INTRAVENOUS | Status: DC | PRN

## 2017-03-28 MED ORDER — OXYCODONE HCL 5 MG PO TABS: 5.0000 mg | ORAL_TABLET | Freq: Once | ORAL | Status: DC | PRN

## 2017-03-28 MED ORDER — ONDANSETRON HCL 4 MG/2ML IJ SOLN
INTRAMUSCULAR | Status: DC | PRN
  Administered 2017-03-28: 4 mg via INTRAVENOUS

## 2017-03-28 MED ORDER — ONDANSETRON HCL 4 MG/2ML IJ SOLN: 4.0000 mg | Freq: Once | INTRAMUSCULAR | Status: DC | PRN

## 2017-03-28 MED ORDER — FENTANYL CITRATE (PF) 100 MCG/2ML IJ SOLN: 25.0000 ug | INTRAMUSCULAR | Status: DC | PRN

## 2017-03-28 MED ORDER — PROPOFOL 10 MG/ML IV BOLUS
INTRAVENOUS | Status: DC | PRN
  Administered 2017-03-28: 130 mg via INTRAVENOUS

## 2017-03-28 MED ORDER — NORCO 5-325 MG PO TABS: 1.0000 | ORAL_TABLET | Freq: Four times a day (QID) | ORAL | 0 refills | Status: DC | PRN

## 2017-03-28 MED ORDER — GLYCOPYRROLATE 0.2 MG/ML IJ SOLN
INTRAMUSCULAR | Status: DC | PRN
  Administered 2017-03-28: 0.1 mg via INTRAVENOUS

## 2017-03-28 MED ORDER — LACTATED RINGERS IV SOLN
10.0000 mL/h | INTRAVENOUS | Status: DC
  Administered 2017-03-28: 09:00:00 via INTRAVENOUS

## 2017-03-28 MED ORDER — LACTATED RINGERS IV SOLN: INTRAVENOUS | Status: DC

## 2017-03-28 MED ORDER — DEXAMETHASONE SODIUM PHOSPHATE 4 MG/ML IJ SOLN
INTRAMUSCULAR | Status: DC | PRN
  Administered 2017-03-28: 4 mg via INTRAVENOUS

## 2017-03-28 SURGICAL SUPPLY — 30 items
BANDAGE ELASTIC 2 CLIP NS LF (GAUZE/BANDAGES/DRESSINGS) ×2 IMPLANT
BNDG ESMARK 4X12 TAN STRL LF (GAUZE/BANDAGES/DRESSINGS) ×2 IMPLANT
BNDG STRETCH 4X75 STRL LF (GAUZE/BANDAGES/DRESSINGS) ×2 IMPLANT
COVER LIGHT HANDLE FLEXIBLE (MISCELLANEOUS) ×2 IMPLANT
CUFF TOURN SGL QUICK 18 (TOURNIQUET CUFF) ×2 IMPLANT
DURAPREP 26ML APPLICATOR (WOUND CARE) ×2 IMPLANT
ELECT REM PT RETURN 9FT ADLT (ELECTROSURGICAL) ×2
ELECTRODE REM PT RTRN 9FT ADLT (ELECTROSURGICAL) ×1 IMPLANT
GAUZE PETRO XEROFOAM 1X8 (MISCELLANEOUS) ×2 IMPLANT
GAUZE SPONGE 4X4 12PLY STRL (GAUZE/BANDAGES/DRESSINGS) ×2 IMPLANT
GLOVE BIO SURGEON STRL SZ7.5 (GLOVE) ×2 IMPLANT
GLOVE BIO SURGEON STRL SZ8 (GLOVE) ×2 IMPLANT
GLOVE INDICATOR 8.0 STRL GRN (GLOVE) ×2 IMPLANT
GOWN STRL REUS W/ TWL LRG LVL3 (GOWN DISPOSABLE) ×2 IMPLANT
GOWN STRL REUS W/TWL LRG LVL3 (GOWN DISPOSABLE) ×2
KIT TURNOVER KIT A (KITS) ×2 IMPLANT
LOOP VESSEL RED MINI 1.3X0.9 (MISCELLANEOUS) ×1 IMPLANT
LOOPS RED MINI 1.3MMX0.9MM (MISCELLANEOUS) ×1
NS IRRIG 500ML POUR BTL (IV SOLUTION) ×2 IMPLANT
PACK EXTREMITY ARMC (MISCELLANEOUS) ×2 IMPLANT
PADDING CAST 2X4YD ST (MISCELLANEOUS) ×1
PADDING CAST BLEND 2X4 STRL (MISCELLANEOUS) ×1 IMPLANT
SOL PREP PVP 2OZ (MISCELLANEOUS) ×2
SOLUTION PREP PVP 2OZ (MISCELLANEOUS) ×1 IMPLANT
SPLINT CAST 1 STEP 3X12 (MISCELLANEOUS) ×2 IMPLANT
STOCKINETTE 4X48 STRL (DRAPES) ×2 IMPLANT
STRAP BODY AND KNEE 60X3 (MISCELLANEOUS) ×2 IMPLANT
SUT ETHILON 4-0 (SUTURE) ×3
SUT ETHILON 4-0 FS2 18XMFL BLK (SUTURE) ×3
SUTURE ETHLN 4-0 FS2 18XMF BLK (SUTURE) ×3 IMPLANT

## 2017-03-28 NOTE — Anesthesia Procedure Notes (Signed)
Procedure Name: LMA Insertion Date/Time: 03/28/2017 8:43 AM Performed by: Jimmy PicketAmyot, Neenah Canter, CRNA Pre-anesthesia Checklist: Patient identified, Emergency Drugs available, Suction available, Timeout performed and Patient being monitored Patient Re-evaluated:Patient Re-evaluated prior to induction Oxygen Delivery Method: Circle system utilized Preoxygenation: Pre-oxygenation with 100% oxygen Induction Type: IV induction LMA: LMA inserted LMA Size: 4.0 Number of attempts: 1 Placement Confirmation: positive ETCO2 and breath sounds checked- equal and bilateral Tube secured with: Tape

## 2017-03-28 NOTE — Transfer of Care (Signed)
Immediate Anesthesia Transfer of Care Note  Patient: Ann Peters  Procedure(s) Performed: MIDDLE FINGER DUPREYTRENS RELEASE (Left Hand)  Patient Location: PACU  Anesthesia Type: General  Level of Consciousness: awake, alert  and patient cooperative  Airway and Oxygen Therapy: Patient Spontanous Breathing and Patient connected to supplemental oxygen  Post-op Assessment: Post-op Vital signs reviewed, Patient's Cardiovascular Status Stable, Respiratory Function Stable, Patent Airway and No signs of Nausea or vomiting  Post-op Vital Signs: Reviewed and stable  Complications: No apparent anesthesia complications

## 2017-03-28 NOTE — Anesthesia Postprocedure Evaluation (Signed)
Anesthesia Post Note  Patient: Ann Peters  Procedure(s) Performed: MIDDLE FINGER DUPREYTRENS RELEASE (Left Hand)  Patient location during evaluation: PACU Anesthesia Type: General Level of consciousness: awake and alert, oriented and patient cooperative Pain management: pain level controlled Vital Signs Assessment: post-procedure vital signs reviewed and stable Respiratory status: spontaneous breathing, nonlabored ventilation and respiratory function stable Cardiovascular status: blood pressure returned to baseline and stable Postop Assessment: adequate PO intake Anesthetic complications: no    Reed BreechAndrea Azaryah Heathcock

## 2017-03-28 NOTE — H&P (Signed)
  H and P reviewed. No changes. Uploaded at later date. 

## 2017-03-28 NOTE — Op Note (Signed)
03/28/2017  10:18 AM  PATIENT:  Ann Peters  67 y.o. female  PRE-OPERATIVE DIAGNOSIS:  DUPUYTRENS CONTRACTURE  POST-OPERATIVE DIAGNOSIS:  DUPUYTRENS CONTRACTURE  PROCEDURE:  Procedure(s): MIDDLE FINGER DUPREYTRENS RELEASE (Left)  SURGEON:   Erin SonsHarold Symantha Steeber, Montez HagemanJr., MD  ANESTHESIA: General  IMPLANTS: None  HISTORY: She has a long history of a nodular mass in her left palm that was in line with her left middle finger.  It has been present for years and she was desirous of having it excised.  OP NOTE: Patient was taken the operating room where satisfactory general anesthesia was achieved.  A tourniquet was applied to the patient's left upper extremity.  The left upper extremity was then prepped and draped in usual fashion for procedure about the hand.  Next the left upper extremity was exsanguinated and the tourniquet was inflated.  A Z-type incision was made over the nodule that was in line with the middle finger.  Flaps were created.  The nodule and Dupuytren's bands were exposed.  I divided the Dupuytren's bands proximally and then dissected distally taking care to protect the neurovascular bundles.  I was ultimately able to completely excise the nodule and the Dupuytren's bands that were extending from it.  The tourniquet was released at this time.  It had been up about 25 minutes.  Bleeding was controlled with coagulation cautery and digital pressure.  The wound was closed with 4-0 nylon sutures.  A compression dressing with a fiberglass volar splint was applied to the left hand, wrist and forearm.  Patient was then awakened and transferred to her stretcher bed.  She was taken to the recovery room in satisfactory condition.  Blood loss was negligible.  I did send the nodule to pathology for their review.

## 2017-03-28 NOTE — Anesthesia Preprocedure Evaluation (Signed)
Anesthesia Evaluation  Patient identified by MRN, date of birth, ID band Patient awake    Reviewed: Allergy & Precautions, NPO status , Patient's Chart, lab work & pertinent test results  History of Anesthesia Complications Negative for: history of anesthetic complications  Airway Mallampati: II  TM Distance: >3 FB Neck ROM: Full    Dental  (+) Implants   Pulmonary neg pulmonary ROS,    Pulmonary exam normal breath sounds clear to auscultation       Cardiovascular Exercise Tolerance: Good negative cardio ROS Normal cardiovascular exam Rhythm:Regular Rate:Normal     Neuro/Psych PSYCHIATRIC DISORDERS Anxiety Depression Postherpetic neuralgia    GI/Hepatic Diverticulitis    Endo/Other  Obesity   Renal/GU negative Renal ROS     Musculoskeletal  (+) Arthritis , Osteoarthritis,  Fibromyalgia -  Abdominal   Peds  Hematology  (+) Blood dyscrasia, anemia ,   Anesthesia Other Findings   Reproductive/Obstetrics                             Anesthesia Physical Anesthesia Plan  ASA: II  Anesthesia Plan: General   Post-op Pain Management:    Induction: Intravenous  PONV Risk Score and Plan: 2 and Ondansetron and Dexamethasone  Airway Management Planned: LMA  Additional Equipment:   Intra-op Plan:   Post-operative Plan: Extubation in OR  Informed Consent: I have reviewed the patients History and Physical, chart, labs and discussed the procedure including the risks, benefits and alternatives for the proposed anesthesia with the patient or authorized representative who has indicated his/her understanding and acceptance.     Plan Discussed with: CRNA  Anesthesia Plan Comments:         Anesthesia Quick Evaluation

## 2017-03-28 NOTE — Discharge Instructions (Signed)
General Anesthesia, Adult, Care After °These instructions provide you with information about caring for yourself after your procedure. Your health care provider may also give you more specific instructions. Your treatment has been planned according to current medical practices, but problems sometimes occur. Call your health care provider if you have any problems or questions after your procedure. °What can I expect after the procedure? °After the procedure, it is common to have: °· Vomiting. °· A sore throat. °· Mental slowness. ° °It is common to feel: °· Nauseous. °· Cold or shivery. °· Sleepy. °· Tired. °· Sore or achy, even in parts of your body where you did not have surgery. ° °Follow these instructions at home: °For at least 24 hours after the procedure: °· Do not: °? Participate in activities where you could fall or become injured. °? Drive. °? Use heavy machinery. °? Drink alcohol. °? Take sleeping pills or medicines that cause drowsiness. °? Make important decisions or sign legal documents. °? Take care of children on your own. °· Rest. °Eating and drinking °· If you vomit, drink water, juice, or soup when you can drink without vomiting. °· Drink enough fluid to keep your urine clear or pale yellow. °· Make sure you have little or no nausea before eating solid foods. °· Follow the diet recommended by your health care provider. °General instructions °· Have a responsible adult stay with you until you are awake and alert. °· Return to your normal activities as told by your health care provider. Ask your health care provider what activities are safe for you. °· Take over-the-counter and prescription medicines only as told by your health care provider. °· If you smoke, do not smoke without supervision. °· Keep all follow-up visits as told by your health care provider. This is important. °Contact a health care provider if: °· You continue to have nausea or vomiting at home, and medicines are not helpful. °· You  cannot drink fluids or start eating again. °· You cannot urinate after 8-12 hours. °· You develop a skin rash. °· You have fever. °· You have increasing redness at the site of your procedure. °Get help right away if: °· You have difficulty breathing. °· You have chest pain. °· You have unexpected bleeding. °· You feel that you are having a life-threatening or urgent problem. °This information is not intended to replace advice given to you by your health care provider. Make sure you discuss any questions you have with your health care provider. °Document Released: 05/13/2000 Document Revised: 07/10/2015 Document Reviewed: 01/19/2015 °Elsevier Interactive Patient Education © 2018 Elsevier Inc. ° ° °Ice pack ° °Elevation ° ° RTC in about 10 days °

## 2017-04-01 LAB — SURGICAL PATHOLOGY

## 2017-05-21 DIAGNOSIS — F6089 Other specific personality disorders: Secondary | ICD-10-CM | POA: Diagnosis not present

## 2017-05-21 DIAGNOSIS — F3341 Major depressive disorder, recurrent, in partial remission: Secondary | ICD-10-CM | POA: Diagnosis not present

## 2017-05-21 DIAGNOSIS — M797 Fibromyalgia: Secondary | ICD-10-CM | POA: Diagnosis not present

## 2017-06-03 ENCOUNTER — Telehealth: Payer: Self-pay | Admitting: Family Medicine

## 2017-06-03 NOTE — Telephone Encounter (Signed)
Reschedule awv ° °

## 2017-06-09 NOTE — Telephone Encounter (Signed)
Called pt to reschedule 01/22/18 AWV. Program no longer available on Thursdays. LVM requesting returned call. 

## 2017-06-10 NOTE — Telephone Encounter (Signed)
Called pt to reschedule 01/22/18 AWV. Program no longer available on Thursdays. LVM requesting returned call.

## 2017-06-17 ENCOUNTER — Other Ambulatory Visit: Payer: Self-pay

## 2017-06-17 ENCOUNTER — Ambulatory Visit (INDEPENDENT_AMBULATORY_CARE_PROVIDER_SITE_OTHER): Payer: Medicare Other | Admitting: Internal Medicine

## 2017-06-17 ENCOUNTER — Encounter (INDEPENDENT_AMBULATORY_CARE_PROVIDER_SITE_OTHER): Payer: Self-pay

## 2017-06-17 ENCOUNTER — Encounter: Payer: Self-pay | Admitting: Internal Medicine

## 2017-06-17 VITALS — BP 128/86 | HR 83 | Resp 16 | Ht 68.0 in | Wt 235.0 lb

## 2017-06-17 DIAGNOSIS — B029 Zoster without complications: Secondary | ICD-10-CM | POA: Diagnosis not present

## 2017-06-17 DIAGNOSIS — F3341 Major depressive disorder, recurrent, in partial remission: Secondary | ICD-10-CM | POA: Diagnosis not present

## 2017-06-17 DIAGNOSIS — Z86010 Personal history of colonic polyps: Secondary | ICD-10-CM | POA: Diagnosis not present

## 2017-06-17 NOTE — Progress Notes (Signed)
Date:  06/17/2017   Name:  Ann Peters   DOB:  1950/08/06   MRN:  [id number]   Chief Complaint: Herpes Zoster (Internal Shingles needs refills also wants to discuss GI referral and colonoscopy )  Recurrent zoster - she has had recurrent episodes of zoster in various dermatomes.  Some times she has a rash, and other times not.  One area was confirmed by Dr. Cheree Ditto with biopsy.  2 weeks ago she started having lower abd pain and loose stools that she decided must be internal shingles.  She started back on Valtrex and sx improved so "it must have been shingles".  Colonoscopy referral - she has last study around 2011.  Dr. Ricki Rodriguez but does not want to go back.  Wants to see Dr. Servando Snare.  Review of Systems  Constitutional: Negative for chills, fatigue, fever and unexpected weight change.  Eyes: Negative for visual disturbance.  Respiratory: Negative for chest tightness, shortness of breath and wheezing.   Cardiovascular: Negative for chest pain, palpitations and leg swelling.  Gastrointestinal: Positive for abdominal pain (resolved) and constipation. Negative for nausea and vomiting.  Skin: Negative for rash.  Psychiatric/Behavioral: Negative for sleep disturbance.    Patient Active Problem List   Diagnosis Date Noted  . Anemia 02/17/2017  . Insomnia 11/05/2016  . Sinusitis 10/18/2016  . Osteoarthritis of left hip 10/10/2015  . Status post left hip replacement 10/10/2015  . Onychomycosis of left great toe 05/10/2015  . Prediabetes 01/03/2015  . Diverticulitis 09/01/2014  . Vitamin D deficiency 09/01/2014  . Herpes zoster 09/01/2014  . FH: diabetes mellitus 09/01/2014  . FHx: heart disease 09/01/2014  . FH: lymphoma 09/01/2014  . Obesity, Class II, BMI 35-39.9, no comorbidity 09/01/2014  . Recurrent major depression resistant to treatment (HCC) 11/15/2013  . Fibromyalgia 11/15/2013  . Recurrent major depressive disorder, in partial remission (HCC) 11/15/2013  . Vitamin B12  deficiency 12/23/2011  . Allergic rhinitis 04/18/2011    Prior to Admission medications   Medication Sig Start Date End Date Taking? Authorizing Provider  albuterol (PROVENTIL HFA;VENTOLIN HFA) 108 (90 Base) MCG/ACT inhaler Inhale 2 puffs into the lungs every 4 (four) hours as needed for wheezing or shortness of breath. Dispense with aerochamber 05/13/16  Yes Domenick Gong, MD  Cholecalciferol (VITAMIN D3) 5000 UNITS CAPS Take 1 capsule (5,000 Units total) by mouth daily. 09/01/14  Yes Plonk, Chrissie Noa, MD  Cyanocobalamin (VITAMIN B-12) 5000 MCG SUBL Place 15,000 mcg under the tongue daily.   Yes [provider]  DULoxetine (CYMBALTA) 60 MG capsule Take 2 capsules (120 mg total) by mouth daily. 08/07/16 08/07/17 Yes Plonk, Chrissie Noa, MD  fluticasone (FLONASE) 50 MCG/ACT nasal spray Place 2 sprays into both nostrils daily. 09/17/16  Yes Lutricia Feil, PA-C  Multiple Vitamins-Minerals (MULTIVITAMIN WOMEN 50+) TABS Take 1 tablet by mouth daily.   Yes [provider]  sodium chloride (OCEAN) 0.65 % SOLN nasal spray Place 1 spray into both nostrils as needed for congestion.   Yes [provider]  traZODone (DESYREL) 50 MG tablet Take 1 tablet (50 mg total) by mouth at bedtime as needed for sleep. 02/17/17  Yes Plonk, Chrissie Noa, MD  valACYclovir (VALTREX) 1000 MG tablet Take 1,000 mg by mouth 2 (two) times daily.   Yes [provider]  NORCO 5-325 MG tablet Take 1-2 tablets by mouth every 6 (six) hours as needed for up to 20 doses for moderate pain. MAXIMUM TOTAL ACETAMINOPHEN DOSE IS 4000 MG PER DAY Patient not  taking: Reported on 06/17/2017 03/28/17   Erin Sons, MD    Allergies  Allergen Reactions  . Amoxicillin-Pot Clavulanate Nausea And Vomiting and Other (See Comments)    Other reaction(s): Vomiting  . Beef-Derived Products Nausea And Vomiting  . Pegademase Bovine Nausea And Vomiting  . Penicillins Nausea And Vomiting and Other (See Comments)    Has  patient had a PCN reaction causing immediate rash, facial/tongue/throat swelling, SOB or lightheadedness with hypotension: no Has patient had a PCN reaction causing severe rash involving mucus membranes or skin necrosis: No Has patient had a PCN reaction that required hospitalization No Has patient had a PCN reaction occurring within the last 10 years: Yes If all of the above answers are "NO", then may proceed with Cephalosporin use.  UNSPECIFIED  Has patient had a PCN reaction causing immediate rash, facial/tongue/throat swelling, SOB or lightheadedness with hypotension: no Has patient had a PCN reaction causing severe rash involving mucus membranes or skin necrosis: No Has patient had a PCN reaction that required hospitalization No Has patient had a PCN reaction occurring within the last 10 years: Yes If all of the above answers are "NO", then may proceed with Cephalosporin use.    Past Surgical History:  Procedure Laterality Date  . BLADDER REPAIR    . COLON SURGERY     part of colon removed  . COLONOSCOPY  2011   polyp removed- Dr Marva Panda  . NASAL SINUS SURGERY    . TONSILLECTOMY    . TOTAL HIP ARTHROPLASTY Left 10/10/2015   Procedure: LEFT TOTAL HIP ARTHROPLASTY ANTERIOR APPROACH;  Surgeon: Kathryne Hitch, MD;  Location: [facility];  Service: Orthopedics;  Laterality: Left;  . TRIGGER FINGER RELEASE Left 03/28/2017   Procedure: MIDDLE FINGER DUPREYTRENS RELEASE;  Surgeon: Erin Sons, MD;  Location: [facility];  Service: Orthopedics;  Laterality: Left;  . TUBAL LIGATION    . VAGINAL HYSTERECTOMY      Social History   Tobacco Use  . Smoking status: Never Smoker  . Smokeless tobacco: Never Used  Substance Use Topics  . Alcohol use: No    Alcohol/week: 0.0 oz  . Drug use: No     Medication list has been reviewed and updated.  PHQ 2/9 Scores 01/20/2017 01/20/2017 11/05/2016 10/18/2016  PHQ - 2 Score 1 1 0 6  PHQ- 9 Score 10 - 1 16    Physical Exam    Constitutional: She is oriented to person, place, and time. She appears well-developed. No distress.  HENT:  Head: Normocephalic and atraumatic.  Cardiovascular: Normal rate, regular rhythm and normal heart sounds.  Pulmonary/Chest: Effort normal and breath sounds normal. No respiratory distress.  Musculoskeletal: Normal range of motion.  Neurological: She is alert and oriented to person, place, and time.  Skin: Skin is warm and dry. No rash noted.  Psychiatric: She has a normal mood and affect. Her behavior is normal. Thought content normal.    BP 128/86   Pulse 83   Resp 16   Ht  (1.727 m)   Wt 235 lb (106.6 kg)   SpO2 100%   BMI 35.73 kg/m   Assessment and Plan: 1. H/O adenomatous polyp of colon Refer for consultation - Ambulatory referral to Gastroenterology  2. Recurrent major depressive disorder, in partial remission (HCC) controlled  3. Herpes zoster without complication Recommend resuming daily Valtrex   No orders of the defined types were placed in this encounter.   Partially dictated using Animal nutritionist. Any errors are  unintentional.  Bari Edward, MD [facility]  06/17/2017

## 2017-06-18 ENCOUNTER — Telehealth: Payer: Self-pay

## 2017-06-18 NOTE — Telephone Encounter (Signed)
Gastroenterology Pre-Procedure Review  Request Date: 07/04/17  Requesting Physician: Dr. Servando Snare   PATIENT REVIEW QUESTIONS: The patient responded to the following health history questions as indicated:    1. Are you having any GI issues? No   2. Do you have a personal history of Polyps? Yes   3. Do you have a family history of Colon Cancer or Polyps? No    4. Diabetes Mellitus? No   5. Joint replacements in the past 12 months? No  6. Major health problems in the past 3 months? No  7. Any artificial heart valves, MVP, or defibrillator? No     MEDICATIONS & ALLERGIES:    Patient reports the following regarding taking any anticoagulation/antiplatelet therapy: no   Plavix, Coumadin, Eliquis, Xarelto, Lovenox, Pradaxa, Brilinta, or Effient?    Aspirin? No    Patient confirms/reports the following medications:  Current Outpatient Medications  Medication Sig Dispense Refill  . albuterol (PROVENTIL HFA;VENTOLIN HFA) 108 (90 Base) MCG/ACT inhaler Inhale 2 puffs into the lungs every 4 (four) hours as needed for wheezing or shortness of breath. Dispense with aerochamber 1 Inhaler 0  . Cholecalciferol (VITAMIN D3) 5000 UNITS CAPS Take 1 capsule (5,000 Units total) by mouth daily. 30 capsule   . Cyanocobalamin (VITAMIN B-12) 5000 MCG SUBL Place 15,000 mcg under the tongue daily.    . DULoxetine (CYMBALTA) 60 MG capsule Take 2 capsules (120 mg total) by mouth daily. 180 capsule 3  . fluticasone (FLONASE) 50 MCG/ACT nasal spray Place 2 sprays into both nostrils daily. 16 g 0  . Multiple Vitamins-Minerals (MULTIVITAMIN WOMEN 50+) TABS Take 1 tablet by mouth daily.    . sodium chloride (OCEAN) 0.65 % SOLN nasal spray Place 1 spray into both nostrils as needed for congestion.    . traZODone (DESYREL) 50 MG tablet Take 1 tablet (50 mg total) by mouth at bedtime as needed for sleep. 90 tablet 2  . valACYclovir (VALTREX) 1000 MG tablet Take 1,000 mg by mouth 2 (two) times daily.    . NORCO 5-325 MG tablet  Take 1-2 tablets by mouth every 6 (six) hours as needed for up to 20 doses for moderate pain. MAXIMUM TOTAL ACETAMINOPHEN DOSE IS 4000 MG PER DAY (Patient not taking: Reported on 06/17/2017) 20 tablet 0   No current facility-administered medications for this visit.     Patient confirms/reports the following allergies:  Allergies  Allergen Reactions  . Amoxicillin-Pot Clavulanate Nausea And Vomiting and Other (See Comments)    Other reaction(s): Vomiting  . Beef-Derived Products Nausea And Vomiting  . Pegademase Bovine Nausea And Vomiting  . Penicillins Nausea And Vomiting and Other (See Comments)    Has patient had a PCN reaction causing immediate rash, facial/tongue/throat swelling, SOB or lightheadedness with hypotension: no Has patient had a PCN reaction causing severe rash involving mucus membranes or skin necrosis: No Has patient had a PCN reaction that required hospitalization No Has patient had a PCN reaction occurring within the last 10 years: Yes If all of the above answers are "NO", then may proceed with Cephalosporin use.  UNSPECIFIED  Has patient had a PCN reaction causing immediate rash, facial/tongue/throat swelling, SOB or lightheadedness with hypotension: no Has patient had a PCN reaction causing severe rash involving mucus membranes or skin necrosis: No Has patient had a PCN reaction that required hospitalization No Has patient had a PCN reaction occurring within the last 10 years: Yes If all of the above answers are "NO", then may proceed with Cephalosporin  use.    No orders of the defined types were placed in this encounter.   AUTHORIZATION INFORMATION Primary Insurance: 1D#: Group #:  Secondary Insurance: 1D#: Group #:  SCHEDULE INFORMATION: Date: 07/04/17 Time: Location: [place]

## 2017-06-23 ENCOUNTER — Other Ambulatory Visit: Payer: Self-pay

## 2017-06-23 DIAGNOSIS — Z1211 Encounter for screening for malignant neoplasm of colon: Secondary | ICD-10-CM

## 2017-06-23 MED ORDER — NA SULFATE-K SULFATE-MG SULF 17.5-3.13-1.6 GM/177ML PO SOLN: 1.0000 | Freq: Once | ORAL | 0 refills | Status: AC

## 2017-07-02 ENCOUNTER — Other Ambulatory Visit: Payer: Self-pay

## 2017-07-03 NOTE — Discharge Instructions (Signed)
General Anesthesia, Adult, Care After °These instructions provide you with information about caring for yourself after your procedure. Your health care provider may also give you more specific instructions. Your treatment has been planned according to current medical practices, but problems sometimes occur. Call your health care provider if you have any problems or questions after your procedure. °What can I expect after the procedure? °After the procedure, it is common to have: °· Vomiting. °· A sore throat. °· Mental slowness. ° °It is common to feel: °· Nauseous. °· Cold or shivery. °· Sleepy. °· Tired. °· Sore or achy, even in parts of your body where you did not have surgery. ° °Follow these instructions at home: °For at least 24 hours after the procedure: °· Do not: °? Participate in activities where you could fall or become injured. °? Drive. °? Use heavy machinery. °? Drink alcohol. °? Take sleeping pills or medicines that cause drowsiness. °? Make important decisions or sign legal documents. °? Take care of children on your own. °· Rest. °Eating and drinking °· If you vomit, drink water, juice, or soup when you can drink without vomiting. °· Drink enough fluid to keep your urine clear or pale yellow. °· Make sure you have little or no nausea before eating solid foods. °· Follow the diet recommended by your health care provider. °General instructions °· Have a responsible adult stay with you until you are awake and alert. °· Return to your normal activities as told by your health care provider. Ask your health care provider what activities are safe for you. °· Take over-the-counter and prescription medicines only as told by your health care provider. °· If you smoke, do not smoke without supervision. °· Keep all follow-up visits as told by your health care provider. This is important. °Contact a health care provider if: °· You continue to have nausea or vomiting at home, and medicines are not helpful. °· You  cannot drink fluids or start eating again. °· You cannot urinate after 8-12 hours. °· You develop a skin rash. °· You have fever. °· You have increasing redness at the site of your procedure. °Get help right away if: °· You have difficulty breathing. °· You have chest pain. °· You have unexpected bleeding. °· You feel that you are having a life-threatening or urgent problem. °This information is not intended to replace advice given to you by your health care provider. Make sure you discuss any questions you have with your health care provider. °Document Released: 05/13/2000 Document Revised: 07/10/2015 Document Reviewed: 01/19/2015 °Elsevier Interactive Patient Education © 2018 Elsevier Inc. ° °

## 2017-07-04 ENCOUNTER — Ambulatory Visit: Payer: Medicare Other | Admitting: Anesthesiology

## 2017-07-04 ENCOUNTER — Encounter
Admission: RE | Disposition: A | Discharge status: Home / Self Care | Payer: Self-pay | Source: Ambulatory Visit | Attending: Gastroenterology

## 2017-07-04 ENCOUNTER — Ambulatory Visit
Admission: RE | Admit: 2017-07-04 | Discharge: 2017-07-04 | Disposition: A | Discharge status: Home / Self Care | Payer: Medicare Other | Source: Ambulatory Visit | Attending: Gastroenterology | Admitting: Gastroenterology

## 2017-07-04 DIAGNOSIS — Z860100 Personal history of colon polyps, unspecified: Secondary | ICD-10-CM

## 2017-07-04 DIAGNOSIS — K5730 Diverticulosis of large intestine without perforation or abscess without bleeding: Secondary | ICD-10-CM | POA: Insufficient documentation

## 2017-07-04 DIAGNOSIS — Z880 Allergy status to penicillin: Secondary | ICD-10-CM | POA: Insufficient documentation

## 2017-07-04 DIAGNOSIS — F419 Anxiety disorder, unspecified: Secondary | ICD-10-CM | POA: Diagnosis not present

## 2017-07-04 DIAGNOSIS — Z91018 Allergy to other foods: Secondary | ICD-10-CM | POA: Insufficient documentation

## 2017-07-04 DIAGNOSIS — Z8619 Personal history of other infectious and parasitic diseases: Secondary | ICD-10-CM | POA: Insufficient documentation

## 2017-07-04 DIAGNOSIS — Z79899 Other long term (current) drug therapy: Secondary | ICD-10-CM | POA: Insufficient documentation

## 2017-07-04 DIAGNOSIS — F329 Major depressive disorder, single episode, unspecified: Secondary | ICD-10-CM | POA: Insufficient documentation

## 2017-07-04 DIAGNOSIS — Z96642 Presence of left artificial hip joint: Secondary | ICD-10-CM | POA: Diagnosis not present

## 2017-07-04 DIAGNOSIS — Z1211 Encounter for screening for malignant neoplasm of colon: Secondary | ICD-10-CM | POA: Diagnosis not present

## 2017-07-04 DIAGNOSIS — M792 Neuralgia and neuritis, unspecified: Secondary | ICD-10-CM | POA: Insufficient documentation

## 2017-07-04 HISTORY — PX: COLONOSCOPY WITH PROPOFOL: SHX5780

## 2017-07-04 SURGERY — COLONOSCOPY WITH PROPOFOL
Anesthesia: General | Wound class: Contaminated

## 2017-07-04 MED ORDER — LACTATED RINGERS IV SOLN
INTRAVENOUS | Status: DC
  Administered 2017-07-04: 09:00:00 via INTRAVENOUS

## 2017-07-04 MED ORDER — SODIUM CHLORIDE 0.9 % IV SOLN: INTRAVENOUS | Status: DC

## 2017-07-04 MED ORDER — PROPOFOL 10 MG/ML IV BOLUS
INTRAVENOUS | Status: DC | PRN
  Administered 2017-07-04: 100 mg via INTRAVENOUS
  Administered 2017-07-04 (×2): 50 mg via INTRAVENOUS

## 2017-07-04 MED ORDER — LIDOCAINE HCL (CARDIAC) PF 100 MG/5ML IV SOSY
PREFILLED_SYRINGE | INTRAVENOUS | Status: DC | PRN
  Administered 2017-07-04: 30 mg via INTRAVENOUS

## 2017-07-04 SURGICAL SUPPLY — 24 items
CANISTER SUCT 1200ML W/VALVE (MISCELLANEOUS) ×2 IMPLANT
CLIP HMST 235XBRD CATH ROT (MISCELLANEOUS) IMPLANT
CLIP RESOLUTION 360 11X235 (MISCELLANEOUS)
ELECT REM PT RETURN 9FT ADLT (ELECTROSURGICAL)
ELECTRODE REM PT RTRN 9FT ADLT (ELECTROSURGICAL) IMPLANT
FCP ESCP3.2XJMB 240X2.8X (MISCELLANEOUS)
FORCEPS BIOP RAD 4 LRG CAP 4 (CUTTING FORCEPS) IMPLANT
FORCEPS BIOP RJ4 240 W/NDL (MISCELLANEOUS)
FORCEPS ESCP3.2XJMB 240X2.8X (MISCELLANEOUS) IMPLANT
GOWN CVR UNV OPN BCK APRN NK (MISCELLANEOUS) ×2 IMPLANT
GOWN ISOL THUMB LOOP REG UNIV (MISCELLANEOUS) ×2
INJECTOR VARIJECT VIN23 (MISCELLANEOUS) IMPLANT
KIT DEFENDO VALVE AND CONN (KITS) IMPLANT
KIT ENDO PROCEDURE OLY (KITS) ×2 IMPLANT
MARKER SPOT ENDO TATTOO 5ML (MISCELLANEOUS) IMPLANT
PROBE APC STR FIRE (PROBE) IMPLANT
RETRIEVER NET ROTH 2.5X230 LF (MISCELLANEOUS) IMPLANT
SNARE SHORT THROW 13M SML OVAL (MISCELLANEOUS) IMPLANT
SNARE SHORT THROW 30M LRG OVAL (MISCELLANEOUS) IMPLANT
SNARE SNG USE RND 15MM (INSTRUMENTS) IMPLANT
SPOT EX ENDOSCOPIC TATTOO (MISCELLANEOUS)
TRAP ETRAP POLY (MISCELLANEOUS) IMPLANT
VARIJECT INJECTOR VIN23 (MISCELLANEOUS)
WATER STERILE IRR 250ML POUR (IV SOLUTION) ×2 IMPLANT

## 2017-07-04 NOTE — H&P (Signed)
Midge Minium, MD [facility] [address], [address] [place], [place] [address] [phone] Fax : [phone]  Primary Care Physician:  Reubin Milan, MD Primary Gastroenterologist:  Dr. Servando Snare  Pre-Procedure History & Physical: HPI:  Ann Peters is a 67 y.o. female is here for an colonoscopy.   Past Medical History:  Diagnosis Date  . Anxiety   . Depression   . History of shingles    ON AND OFF 6 YEARS, recurrent episodes of internal and external shingles with resulting neuralgia  . Neuromuscular disorder (HCC)    POSTHERPETIC NEURALGIA, left wrist weakness  . Pneumonia    HX   SEVERAL TIMES  NONE IN 3 YRS    Past Surgical History:  Procedure Laterality Date  . BLADDER REPAIR    . COLON SURGERY  2011   10 inches removed for benign mass, does not absorb nutrients as well  . COLONOSCOPY  2011   polyp removed- Dr Marva Panda  . NASAL SINUS SURGERY    . TONSILLECTOMY    . TOTAL HIP ARTHROPLASTY Left 10/10/2015   Procedure: LEFT TOTAL HIP ARTHROPLASTY ANTERIOR APPROACH;  Surgeon: Kathryne Hitch, MD;  Location: [facility] OR;  Service: Orthopedics;  Laterality: Left;  . TRIGGER FINGER RELEASE Left 03/28/2017   Procedure: MIDDLE FINGER DUPREYTRENS RELEASE;  Surgeon: Erin Sons, MD;  Location: [facility];  Service: Orthopedics;  Laterality: Left;  . TUBAL LIGATION    . VAGINAL HYSTERECTOMY      Prior to Admission medications   Medication Sig Start Date End Date Taking? Authorizing Provider  Cholecalciferol (VITAMIN D3) 5000 UNITS CAPS Take 1 capsule (5,000 Units total) by mouth daily. 09/01/14  Yes Plonk, Chrissie Noa, MD  Cyanocobalamin (VITAMIN B-12) 5000 MCG SUBL Place 15,000 mcg under the tongue daily.   Yes [provider]  valACYclovir (VALTREX) 1000 MG tablet Take 1,000 mg by mouth 2 (two) times daily. Am and pm   Yes [provider]  albuterol (PROVENTIL HFA;VENTOLIN HFA) 108 (90 Base) MCG/ACT inhaler Inhale 2 puffs into the lungs every 4  (four) hours as needed for wheezing or shortness of breath. Dispense with aerochamber Patient not taking: Reported on 07/02/2017 05/13/16   Domenick Gong, MD  DULoxetine (CYMBALTA) 60 MG capsule Take 2 capsules (120 mg total) by mouth daily. 08/07/16 08/07/17  Plonk, Chrissie Noa, MD  fluticasone (FLONASE) 50 MCG/ACT nasal spray Place 2 sprays into both nostrils daily. Patient not taking: Reported on 07/04/2017 09/17/16   Lutricia Feil, PA-C  Multiple Vitamins-Minerals (MULTIVITAMIN WOMEN 50+) TABS Take 1 tablet by mouth daily.    [provider]  NORCO 5-325 MG tablet Take 1-2 tablets by mouth every 6 (six) hours as needed for up to 20 doses for moderate pain. MAXIMUM TOTAL ACETAMINOPHEN DOSE IS 4000 MG PER DAY Patient not taking: Reported on 06/17/2017 03/28/17   Erin Sons, MD  sodium chloride (OCEAN) 0.65 % SOLN nasal spray Place 1 spray into both nostrils as needed for congestion.    [provider]  traZODone (DESYREL) 50 MG tablet Take 1 tablet (50 mg total) by mouth at bedtime as needed for sleep. 02/17/17   Schuyler Amor, MD    Allergies as of 06/23/2017 - Review Complete 06/17/2017  Allergen Reaction Noted  . Amoxicillin-pot clavulanate Nausea And Vomiting and Other (See Comments) 07/23/2012  . Beef-derived products Nausea And Vomiting 03/19/2017  . Pegademase bovine Nausea And Vomiting 03/19/2017  . Penicillins Nausea And Vomiting and Other (See Comments) 07/23/2012  Family History  Problem Relation Age of Onset  . Cancer Mother   . Lymphoma Mother   . CAD Father   . Breast cancer Sister 54    Social History   Socioeconomic History  . Marital status: Married    Spouse name: Not on file  . Number of children: 3  . Years of education: Not on file  . Highest education level: Not on file  Occupational History  . Occupation: Retired  Engineer, production  . Financial resource strain: Not hard at all  . Food insecurity:    Worry: Never true    Inability:  Never true  . Transportation needs:    Medical: No    Non-medical: No  Tobacco Use  . Smoking status: Never Smoker  . Smokeless tobacco: Never Used  Substance and Sexual Activity  . Alcohol use: No    Alcohol/week: 0.0 oz  . Drug use: No  . Sexual activity: Not on file  Lifestyle  . Physical activity:    Days per week: 0 days    Minutes per session: 0 min  . Stress: Not at all  Relationships  . Social connections:    Talks on phone: Three times a week    Gets together: Once a week    Attends religious service: 1 to 4 times per year    Active member of club or organization: Yes    Attends meetings of clubs or organizations: Never    Relationship status: Married  . Intimate partner violence:    Fear of current or ex partner: No    Emotionally abused: No    Physically abused: No    Forced sexual activity: No  Other Topics Concern  . Not on file  Social History Narrative  . Not on file    Review of Systems: See HPI, otherwise negative ROS  Physical Exam: BP (!) 114/94   Pulse 64   Temp (!) 97 F (36.1 C) (Temporal)   Resp 16   Ht  (1.727 m)   Wt 231 lb (104.8 kg)   SpO2 100%   BMI 35.12 kg/m  General:   Alert,  pleasant and cooperative in NAD Head:  Normocephalic and atraumatic. Neck:  Supple; no masses or thyromegaly. Lungs:  Clear throughout to auscultation.    Heart:  Regular rate and rhythm. Abdomen:  Soft, nontender and nondistended. Normal bowel sounds, without guarding, and without rebound.   Neurologic:  Alert and  oriented x4;  grossly normal neurologically.  Impression/Plan: Ann Peters is here for an colonoscopy to be performed for history of polyps  Risks, benefits, limitations, and alternatives regarding  colonoscopy have been reviewed with the patient.  Questions have been answered.  All parties agreeable.   Midge Minium, MD  07/04/2017, 9:43 AM

## 2017-07-04 NOTE — Op Note (Signed)
[facility] Patient Name: Ann Peters Procedure Date: 07/04/2017 10:12 AM MRN: [id number] Account #: [id number] Date of Birth: Jan 04, 1951 Admit Type: Outpatient Age: 67 Room: [facility] OR ROOM 01 Gender: Female Note Status: Finalized Procedure:            Colonoscopy Indications:          High risk colon cancer surveillance: Personal history                        of colonic polyps Providers:            Midge Minium MD, MD Referring MD:         Bari Edward, MD (Referring MD) Medicines:            Propofol per Anesthesia Complications:        No immediate complications. Procedure:            Pre-Anesthesia Assessment:                       - Prior to the procedure, a History and Physical was                        performed, and patient medications and allergies were                        reviewed. The patient's tolerance of previous                        anesthesia was also reviewed. The risks and benefits of                        the procedure and the sedation options and risks were                        discussed with the patient. All questions were                        answered, and informed consent was obtained. Prior                        Anticoagulants: The patient has taken no previous                        anticoagulant or antiplatelet agents. ASA Grade                        Assessment: II - A patient with mild systemic disease.                        After reviewing the risks and benefits, the patient was                        deemed in satisfactory condition to undergo the                        procedure.                       After obtaining informed consent, the colonoscope was  passed under direct vision. Throughout the procedure,                        the patient's blood pressure, pulse, and oxygen                        saturations were monitored continuously. The Olympus CF   H180AL Colonoscope (S#: [id number]) was introduced through                        the anus and advanced to the the ileocolonic                        anastomosis. The colonoscopy was performed without                        difficulty. The patient tolerated the procedure well.                        The quality of the bowel preparation was excellent. Findings:      The perianal and digital rectal examinations were normal.      There was evidence of a prior end-to-side colo-colonic anastomosis in       the transverse colon. This was patent.      Multiple small-mouthed diverticula were found in the sigmoid colon. Impression:           - Patent end-to-side colo-colonic anastomosis.                       - Diverticulosis in the sigmoid colon.                       - No specimens collected. Recommendation:       - Discharge patient to home.                       - Resume previous diet.                       - Continue present medications.                       - Repeat colonoscopy in 5 years for surveillance. Procedure Code(s):    --- Professional ---                       [phone], Colonoscopy, flexible; diagnostic, including                        collection of specimen(s) by brushing or washing, when                        performed (separate procedure) Diagnosis Code(s):    --- Professional ---                       Z86.010, Personal history of colonic polyps CPT copyright 2017 American Medical Association. All rights reserved. The codes documented in this report are preliminary and upon coder review may  be revised to meet current compliance requirements. Midge Minium MD, MD 07/04/2017 10:34:53 AM This report has been signed electronically. Number of Addenda: 0 Note Initiated On: 07/04/2017 10:12 AM Scope Withdrawal Time:  0 hours 5 minutes 2 seconds  Total Procedure Duration: 0 hours 10 minutes 33 seconds       [facility]

## 2017-07-04 NOTE — Transfer of Care (Signed)
Immediate Anesthesia Transfer of Care Note  Patient: Ann Peters  Procedure(s) Performed: COLONOSCOPY WITH PROPOFOL (N/A )  Patient Location: PACU  Anesthesia Type: General  Level of Consciousness: awake, alert  and patient cooperative  Airway and Oxygen Therapy: Patient Spontanous Breathing and Patient connected to supplemental oxygen  Post-op Assessment: Post-op Vital signs reviewed, Patient's Cardiovascular Status Stable, Respiratory Function Stable, Patent Airway and No signs of Nausea or vomiting  Post-op Vital Signs: Reviewed and stable  Complications: No apparent anesthesia complications

## 2017-07-04 NOTE — Anesthesia Postprocedure Evaluation (Signed)
Anesthesia Post Note  Patient: Ann Peters  Procedure(s) Performed: COLONOSCOPY WITH PROPOFOL (N/A )  Patient location during evaluation: PACU Anesthesia Type: General Level of consciousness: awake and alert and oriented Pain management: satisfactory to patient Vital Signs Assessment: post-procedure vital signs reviewed and stable Respiratory status: spontaneous breathing, nonlabored ventilation and respiratory function stable Cardiovascular status: blood pressure returned to baseline and stable Postop Assessment: Adequate PO intake and No signs of nausea or vomiting Anesthetic complications: no    Cherly [place]

## 2017-07-04 NOTE — Anesthesia Preprocedure Evaluation (Signed)
Anesthesia Evaluation  Patient identified by MRN, date of birth, ID band Patient awake    Reviewed: Allergy & Precautions, H&P , NPO status , Patient's Chart, lab work & pertinent test results  Airway Mallampati: II  TM Distance: >3 FB Neck ROM: full    Dental no notable dental hx.    Pulmonary    Pulmonary exam normal breath sounds clear to auscultation       Cardiovascular Normal cardiovascular exam Rhythm:regular Rate:Normal     Neuro/Psych PSYCHIATRIC DISORDERS  Neuromuscular disease    GI/Hepatic   Endo/Other    Renal/GU      Musculoskeletal   Abdominal   Peds  Hematology   Anesthesia Other Findings   Reproductive/Obstetrics                             Anesthesia Physical Anesthesia Plan  ASA: II  Anesthesia Plan: General   Post-op Pain Management:    Induction: Intravenous  PONV Risk Score and Plan: 3 and Propofol infusion and Treatment may vary due to age or medical condition  Airway Management Planned: Natural Airway  Additional Equipment:   Intra-op Plan:   Post-operative Plan:   Informed Consent: I have reviewed the patients History and Physical, chart, labs and discussed the procedure including the risks, benefits and alternatives for the proposed anesthesia with the patient or authorized representative who has indicated his/her understanding and acceptance.     Plan Discussed with: CRNA  Anesthesia Plan Comments:         Anesthesia Quick Evaluation

## 2017-07-30 DIAGNOSIS — F609 Personality disorder, unspecified: Secondary | ICD-10-CM | POA: Diagnosis not present

## 2017-07-30 DIAGNOSIS — M797 Fibromyalgia: Secondary | ICD-10-CM | POA: Diagnosis not present

## 2017-07-30 DIAGNOSIS — F3341 Major depressive disorder, recurrent, in partial remission: Secondary | ICD-10-CM | POA: Diagnosis not present

## 2017-08-06 DIAGNOSIS — F6089 Other specific personality disorders: Secondary | ICD-10-CM

## 2017-10-24 ENCOUNTER — Encounter: Payer: Self-pay | Admitting: Internal Medicine

## 2017-10-24 ENCOUNTER — Ambulatory Visit (INDEPENDENT_AMBULATORY_CARE_PROVIDER_SITE_OTHER): Payer: Medicare Other | Admitting: Internal Medicine

## 2017-10-24 VITALS — BP 114/76 | HR 68 | Ht 68.0 in | Wt 233.0 lb

## 2017-10-24 DIAGNOSIS — M797 Fibromyalgia: Secondary | ICD-10-CM | POA: Diagnosis not present

## 2017-10-24 DIAGNOSIS — M533 Sacrococcygeal disorders, not elsewhere classified: Secondary | ICD-10-CM

## 2017-10-24 DIAGNOSIS — Z96642 Presence of left artificial hip joint: Secondary | ICD-10-CM

## 2017-10-24 MED ORDER — BACLOFEN 10 MG PO TABS: 10.0000 mg | ORAL_TABLET | Freq: Every day | ORAL | 0 refills | Status: DC

## 2017-10-24 MED ORDER — DULOXETINE HCL 60 MG PO CPEP: 120.0000 mg | ORAL_CAPSULE | Freq: Every day | ORAL | 3 refills | Status: DC

## 2017-10-24 NOTE — Patient Instructions (Signed)
Heat several times a day ~20 minutes  Tylenol 2 tabs three times a day  Baclofen at bedtime

## 2017-10-24 NOTE — Progress Notes (Signed)
Date:  10/24/2017   Name:  Ann Peters   DOB:  September 17, 1950   MRN:  [id number]   Chief Complaint: Pelvic Pain (Dull ache in left lower pelvic region. Wraps around to back. Urinating normal. No buring or urgency.  HAd a vaginal mesh 10 years ago to lift bladder. x6month. Laying in bed lastnight pain was bad. Contantly feel ache in pelvic. )  She has discomfort over the left SI region and left sided groin pain that is worse with going from sitting to standing.  It hurts when laying flat at times.  She can walk but feels slightly off balance. She has no urinary sx.  S/p TAH and bladder sling. Recent colonoscopy was normal.   Review of Systems  Constitutional: Negative for chills, fatigue and fever.  Respiratory: Negative for chest tightness, shortness of breath and wheezing.   Cardiovascular: Negative for chest pain and palpitations.  Gastrointestinal: Negative for abdominal distention, blood in stool, constipation and diarrhea.  Endocrine: Negative for polyuria.  Genitourinary: Negative for difficulty urinating and dysuria.  Musculoskeletal: Positive for myalgias. Negative for arthralgias and back pain.  Neurological: Negative for dizziness and headaches.  Hematological: Negative for adenopathy.    Patient Active Problem List   Diagnosis Date Noted  . Personal history of colonic polyps   . Anemia 02/17/2017  . Insomnia 11/05/2016  . Sinusitis 10/18/2016  . Osteoarthritis of left hip 10/10/2015  . Status post left hip replacement 10/10/2015  . Onychomycosis of left great toe 05/10/2015  . Prediabetes 01/03/2015  . Diverticulitis 09/01/2014  . Vitamin D deficiency 09/01/2014  . Herpes zoster 09/01/2014  . FH: diabetes mellitus 09/01/2014  . FHx: heart disease 09/01/2014  . FH: lymphoma 09/01/2014  . Obesity, Class II, BMI 35-39.9, no comorbidity 09/01/2014  . Fibromyalgia 11/15/2013  . Recurrent major depressive disorder, in partial remission (HCC) 11/15/2013  . Vitamin B12  deficiency 12/23/2011  . Allergic rhinitis 04/18/2011    Allergies  Allergen Reactions  . Amoxicillin-Pot Clavulanate Nausea And Vomiting and Other (See Comments)    Other reaction(s): Vomiting  . Beef-Derived Products Nausea And Vomiting  . Pegademase Bovine Nausea And Vomiting  . Penicillins Nausea And Vomiting and Other (See Comments)    Has patient had a PCN reaction causing immediate rash, facial/tongue/throat swelling, SOB or lightheadedness with hypotension: no Has patient had a PCN reaction causing severe rash involving mucus membranes or skin necrosis: No Has patient had a PCN reaction that required hospitalization No Has patient had a PCN reaction occurring within the last 10 years: Yes If all of the above answers are "NO", then may proceed with Cephalosporin use.         Past Surgical History:  Procedure Laterality Date  . BLADDER REPAIR    . COLON SURGERY  2011   10 inches removed for benign mass, does not absorb nutrients as well  . COLONOSCOPY  2011   polyp removed- Dr Marva Panda  . COLONOSCOPY WITH PROPOFOL N/A 07/04/2017   Procedure: COLONOSCOPY WITH PROPOFOL;  Surgeon: Midge Minium, MD;  Location: [facility];  Service: Endoscopy;  Laterality: N/A;  recurrent shingles  . NASAL SINUS SURGERY    . TONSILLECTOMY    . TOTAL HIP ARTHROPLASTY Left 10/10/2015   Procedure: LEFT TOTAL HIP ARTHROPLASTY ANTERIOR APPROACH;  Surgeon: Kathryne Hitch, MD;  Location: [facility];  Service: Orthopedics;  Laterality: Left;  . TRIGGER FINGER RELEASE Left 03/28/2017   Procedure: MIDDLE FINGER DUPREYTRENS RELEASE;  Surgeon: Gavin Potters,  Jake Shark, MD;  Location: [facility];  Service: Orthopedics;  Laterality: Left;  . TUBAL LIGATION    . VAGINAL HYSTERECTOMY      Social History   Tobacco Use  . Smoking status: Never Smoker  . Smokeless tobacco: Never Used  Substance Use Topics  . Alcohol use: No    Alcohol/week: 0.0 standard drinks  . Drug use: No      Medication list has been reviewed and updated.  Current Meds  Medication Sig  . albuterol (PROVENTIL HFA;VENTOLIN HFA) 108 (90 Base) MCG/ACT inhaler Inhale 2 puffs into the lungs every 4 (four) hours as needed for wheezing or shortness of breath. Dispense with aerochamber  . Cholecalciferol (VITAMIN D3) 5000 UNITS CAPS Take 1 capsule (5,000 Units total) by mouth daily.  . Cyanocobalamin (VITAMIN B-12) 5000 MCG SUBL Place 15,000 mcg under the tongue daily.  . DULoxetine (CYMBALTA) 60 MG capsule Take 2 capsules (120 mg total) by mouth daily.  . fluticasone (FLONASE) 50 MCG/ACT nasal spray Place 2 sprays into both nostrils daily.  . Multiple Vitamins-Minerals (MULTIVITAMIN WOMEN 50+) TABS Take 1 tablet by mouth daily.  . sodium chloride (OCEAN) 0.65 % SOLN nasal spray Place 1 spray into both nostrils as needed for congestion.  . traZODone (DESYREL) 50 MG tablet Take 1 tablet (50 mg total) by mouth at bedtime as needed for sleep.  . valACYclovir (VALTREX) 1000 MG tablet Take 1,000 mg by mouth 2 (two) times daily. Am and pm  . [DISCONTINUED] DULoxetine (CYMBALTA) 60 MG capsule Take 2 capsules (120 mg total) by mouth daily.    PHQ 2/9 Scores 10/24/2017 01/20/2017 01/20/2017 11/05/2016  PHQ - 2 Score 0 1 1 0  PHQ- 9 Score 0 10 - 1    Physical Exam  Constitutional: She is oriented to person, place, and time. She appears well-developed. No distress.  HENT:  Head: Normocephalic and atraumatic.  Cardiovascular: Normal rate, regular rhythm and normal heart sounds.  Pulmonary/Chest: Effort normal and breath sounds normal. No respiratory distress.  Abdominal: Soft. Normal appearance and bowel sounds are normal. There is no hepatosplenomegaly. There is no tenderness. There is no rigidity and no guarding.  Musculoskeletal: Normal range of motion.       Left hip: She exhibits normal range of motion and normal strength.       Legs: Neurological: She is alert and oriented to person, place, and time.   Skin: Skin is warm and dry. No rash noted.  Psychiatric: She has a normal mood and affect. Her behavior is normal. Thought content normal.  Nursing note and vitals reviewed.   BP 114/76   Pulse 68   Ht 5\' 8"  (1.727 m)   Wt 233 lb (105.7 kg)   SpO2 97%   BMI 35.43 kg/m   Assessment and Plan: 1. Sacro-iliac pain Recommend heat, tylenol and baclofen - baclofen (LIORESAL) 10 MG tablet; Take 1 tablet (10 mg total) by mouth at bedtime.  Dispense: 30 each; Refill: 0  2. Status post left hip replacement No evidence of hip joint involvement  3. Fibromyalgia Continue duloxetine - DULoxetine (CYMBALTA) 60 MG capsule; Take 2 capsules (120 mg total) by mouth daily.  Dispense: 180 capsule; Refill: 3   Meds ordered this encounter  Medications  . baclofen (LIORESAL) 10 MG tablet    Sig: Take 1 tablet (10 mg total) by mouth at bedtime.    Dispense:  30 each    Refill:  0  . DULoxetine (CYMBALTA) 60 MG capsule  Sig: Take 2 capsules (120 mg total) by mouth daily.    Dispense:  180 capsule    Refill:  3    Partially dictated using Animal nutritionist. Any errors are unintentional.  Bari Edward, MD [facility]  10/24/2017   There are no diagnoses linked to this encounter.

## 2017-11-04 ENCOUNTER — Ambulatory Visit (INDEPENDENT_AMBULATORY_CARE_PROVIDER_SITE_OTHER): Payer: Medicare Other | Admitting: Orthopaedic Surgery

## 2017-11-04 ENCOUNTER — Encounter (INDEPENDENT_AMBULATORY_CARE_PROVIDER_SITE_OTHER): Payer: Self-pay | Admitting: Orthopaedic Surgery

## 2017-11-04 ENCOUNTER — Ambulatory Visit (INDEPENDENT_AMBULATORY_CARE_PROVIDER_SITE_OTHER): Payer: Medicare Other

## 2017-11-04 DIAGNOSIS — M5442 Lumbago with sciatica, left side: Secondary | ICD-10-CM | POA: Diagnosis not present

## 2017-11-04 DIAGNOSIS — Z96642 Presence of left artificial hip joint: Secondary | ICD-10-CM

## 2017-11-04 DIAGNOSIS — R222 Localized swelling, mass and lump, trunk: Secondary | ICD-10-CM

## 2017-11-04 DIAGNOSIS — G8929 Other chronic pain: Secondary | ICD-10-CM

## 2017-11-04 DIAGNOSIS — M5441 Lumbago with sciatica, right side: Secondary | ICD-10-CM | POA: Diagnosis not present

## 2017-11-04 MED ORDER — METHYLPREDNISOLONE 4 MG PO TABS: ORAL_TABLET | ORAL | 0 refills | Status: DC

## 2017-11-04 NOTE — Progress Notes (Signed)
Office Visit Note   Patient: Ann Peters           Date of Birth: 08/20/1950           MRN: [id number] Visit Date: 11/04/2017              Requested by: Reubin Milan, MD [address] [place], [place] [address] PCP: Reubin Milan, MD   Assessment & Plan: Visit Diagnoses:  1. History of left hip replacement   2. Chronic low back pain with left-sided sciatica, unspecified back pain laterality   3. Acute right-sided low back pain with right-sided sciatica     Plan: We will place her on a Medrol Dosepak.  She will apply moist heat to the low back.  Will obtain an MRI of the lumbar spine with and without contrast to evaluate HNP as a source of her radicular symptoms down the left leg and also evaluate the left lower para lumbar and parasacral area mass.  Questions are encouraged and answered by Dr. Magnus Ivan and myself.  We will see her back after the MRI.  Follow-Up Instructions: Return in about 2 weeks (around 11/18/2017) for AFTER MRI.   Orders:  Orders Placed This Encounter  Procedures  . XR HIP UNILAT W OR W/O PELVIS 1V LEFT  . XR Lumbar Spine 2-3 Views   Meds ordered this encounter  Medications  . methylPREDNISolone (MEDROL) 4 MG tablet    Sig: Take as directed    Dispense:  21 tablet    Refill:  0      Procedures: No procedures performed   Clinical Data: No additional findings.   Subjective: Chief Complaint  Patient presents with  . Left Hip - Pain  . Left Leg - Pain    HPI Mrs. Stanbrough is well-known to Dr. Magnus Ivan service she underwent a left total hip arthroplasty 10/10/2015.  She is done well until last month whenever she is developed some low back pain she describes as throbbing.  She has numbness tingling down to the knee of the left leg.  She is also concerned about a mass that her husband and found in the lower lumbar sacral paraspinous region.  She had no particular injury.  She did do a long trip where she was seated for long periods of  time in an automobile right before the pain began.  She did see her primary care physician who placed her on muscle relaxants which helped some.  Heat to the low back does help some.  Pain does awaken her.  She is had no bowel or bladder dysfunction but she does suffer from chronic constipation.  She has a history of surgical resection of the colon and vaginal mesh placement.  She feels that her leg lengths are not equal since undergoing the hip replacement. Review of Systems Denies any fevers chills shortness of breath chest pain  Objective: Vital Signs: There were no vitals taken for this visit.  Physical Exam  Constitutional: She is oriented to person, place, and time. She appears well-developed and well-nourished. No distress.  Pulmonary/Chest: Effort normal.  Neurological: She is alert and oriented to person, place, and time.  Skin: She is not diaphoretic.  Psychiatric: She has a normal mood and affect.    Ortho Exam Good range of motion bilateral hips without pain.  Negative for pain over the trochanteric region of both hips.  Calf supple nontender bilaterally.  She has 5 out of 5 strength throughout the  lower extremities except for the left hip which she has 4 out of 5 strength with flexion against resistance.  Negative straight leg raise bilaterally.  She is able to come within 4 inches of touching her toes this causes pain.  She has somewhat limited extension of the lumbar spine without pain.  She is nontender right lumbar paraspinous region.  Nontender over the spinal column.  Slight tenderness over the lower lumbar paraspinous region.  There is mobile palpable mass in the lower lumbar upper sacral paraspinous region deep with palpation.  Deep tendon reflexes are 2+ at the knees and 1+ at the ankles and equal and symmetric.  Sensation grossly intact bilateral feet to light touch.  Dorsal pedal pulses are intact bilaterally.  Leg lengths are equal on exam.  Specialty Comments:  No  specialty comments available.  Imaging: Xr Hip Unilat W Or W/o Pelvis 1v Left  Result Date: 11/04/2017 AP pelvis lateral view of the left hip: Bilateral hips are well located.  Right hip is well-preserved.  Left total hip arthroplasty components appear well-seated.  There is no evidence of acute fracture or bony abnormalities.  Xr Lumbar Spine 2-3 Views  Result Date: 11/04/2017 Lumbar spine 2 views: No acute fractures.  Loss of lordotic curvature.  Mid to lower lumbar spine with facet arthritic changes.  No spondylolisthesis.  Slight scoliosis of the mid to lower lumbar spine with convexity to the left.    PMFS History: Patient Active Problem List   Diagnosis Date Noted  . Personal history of colonic polyps   . Anemia 02/17/2017  . Insomnia 11/05/2016  . Sinusitis 10/18/2016  . Osteoarthritis of left hip 10/10/2015  . Status post left hip replacement 10/10/2015  . Onychomycosis of left great toe 05/10/2015  . Prediabetes 01/03/2015  . Diverticulitis 09/01/2014  . Vitamin D deficiency 09/01/2014  . Herpes zoster 09/01/2014  . FH: diabetes mellitus 09/01/2014  . FHx: heart disease 09/01/2014  . FH: lymphoma 09/01/2014  . Obesity, Class II, BMI 35-39.9, no comorbidity 09/01/2014  . Fibromyalgia 11/15/2013  . Recurrent major depressive disorder, in partial remission (HCC) 11/15/2013  . Vitamin B12 deficiency 12/23/2011  . Allergic rhinitis 04/18/2011   Past Medical History:  Diagnosis Date  . Anxiety   . Depression   . History of shingles    ON AND OFF 6 YEARS, recurrent episodes of internal and external shingles with resulting neuralgia  . Neuromuscular disorder (HCC)    POSTHERPETIC NEURALGIA, left wrist weakness  . Pneumonia    HX   SEVERAL TIMES  NONE IN 3 YRS    Family History  Problem Relation Age of Onset  . Cancer Mother   . Lymphoma Mother   . CAD Father   . Breast cancer Sister 41    Past Surgical History:  Procedure Laterality Date  . BLADDER REPAIR      . COLON SURGERY  2011   10 inches removed for benign mass, does not absorb nutrients as well  . COLONOSCOPY  2011   polyp removed- Dr Marva Panda  . COLONOSCOPY WITH PROPOFOL N/A 07/04/2017   Procedure: COLONOSCOPY WITH PROPOFOL;  Surgeon: Midge Minium, MD;  Location: [facility];  Service: Endoscopy;  Laterality: N/A;  recurrent shingles  . NASAL SINUS SURGERY    . TONSILLECTOMY    . TOTAL HIP ARTHROPLASTY Left 10/10/2015   Procedure: LEFT TOTAL HIP ARTHROPLASTY ANTERIOR APPROACH;  Surgeon: Kathryne Hitch, MD;  Location: [facility];  Service: Orthopedics;  Laterality: Left;  .  TRIGGER FINGER RELEASE Left 03/28/2017   Procedure: MIDDLE FINGER DUPREYTRENS RELEASE;  Surgeon: Erin SonsKernodle, Harold, MD;  [facility];  Service: Orthopedics;  Laterality: Left;  . TUBAL LIGATION    . VAGINAL HYSTERECTOMY     Social History   Occupational History  . Occupation: Retired  Tobacco Use  . Smoking status: Never Smoker  . Smokeless tobacco: Never Used  Substance and Sexual Activity  . Alcohol use: No    Alcohol/week: 0.0 standard drinks  . Drug use: No  . Sexual activity: Not on file

## 2017-11-05 ENCOUNTER — Telehealth: Payer: Self-pay

## 2017-11-05 NOTE — Telephone Encounter (Signed)
Patient called leaving VM stating when she drinks caffeine she is having chest pains. No SOB, no numbness in arms or face, no heart palpitations. She stated this happens only when she drinks caffeine and its a "burning feeling in chest here and there." She stopped caffeine last week and has not had a episode since but promised her husband she would give us a call anyway.  Informed her to stay off of caffeine and if this happens again while off of caffeine to call and sched appt with Dr Judithann GravesBerglund. If she has any sx of SOB, with chest pains or heart palpitations to go to ER asap.  She verbalized understanding of this.

## 2017-11-06 ENCOUNTER — Telehealth: Payer: Self-pay | Admitting: Internal Medicine

## 2017-11-06 ENCOUNTER — Other Ambulatory Visit (INDEPENDENT_AMBULATORY_CARE_PROVIDER_SITE_OTHER): Payer: Self-pay

## 2017-11-06 DIAGNOSIS — M5441 Lumbago with sciatica, right side: Secondary | ICD-10-CM

## 2017-11-06 DIAGNOSIS — M5442 Lumbago with sciatica, left side: Secondary | ICD-10-CM

## 2017-11-06 DIAGNOSIS — R222 Localized swelling, mass and lump, trunk: Secondary | ICD-10-CM

## 2017-11-06 DIAGNOSIS — G8929 Other chronic pain: Secondary | ICD-10-CM

## 2017-11-06 NOTE — Telephone Encounter (Signed)
Called to RE schedule Medicare Annual Wellness Visit with the Nurse Health Advisor.  ° ° °Thank you! °For any questions please contact: Kathryn Brown [phone] or Skype at: [email or web address]  ° ° °

## 2017-11-18 ENCOUNTER — Ambulatory Visit (INDEPENDENT_AMBULATORY_CARE_PROVIDER_SITE_OTHER): Payer: Medicare Other | Admitting: Orthopaedic Surgery

## 2017-11-18 ENCOUNTER — Encounter (INDEPENDENT_AMBULATORY_CARE_PROVIDER_SITE_OTHER): Payer: Self-pay | Admitting: Orthopaedic Surgery

## 2017-11-18 DIAGNOSIS — R222 Localized swelling, mass and lump, trunk: Secondary | ICD-10-CM

## 2017-11-18 DIAGNOSIS — G8929 Other chronic pain: Secondary | ICD-10-CM | POA: Diagnosis not present

## 2017-11-18 DIAGNOSIS — Z96642 Presence of left artificial hip joint: Secondary | ICD-10-CM | POA: Diagnosis not present

## 2017-11-18 DIAGNOSIS — M5442 Lumbago with sciatica, left side: Secondary | ICD-10-CM | POA: Diagnosis not present

## 2017-11-18 NOTE — Progress Notes (Signed)
Patient is a very pleasant 67 year old female who is returning for follow-up.  We originally is wanted to get an MRI of her lumbar spine because she was having some sciatic symptoms as well as feeling a palpable mass in her lower lumbar spine area and soft tissue that was concerning for her.  She was also told by chiropractor that she had a ligament discrepancy with her left operative hip from her total hip arthroplasty being longer than the right side.  On exam we laid her down supine and her leg lengths are entirely equal.  Also reviewed her last plain films from her last visit in August and these were standing films and also showed her leg lengths are equal.  Also went over her plain films of the lumbar spine show that she does have a slight degenerative scoliosis.  Now she reports that her symptoms are completely resolved and she is having no pain or issues at all.  I could palpate potentially a small mass in the soft tissue to the left side of the lower lumbar spine is more consistent with a lipoma.  I told her just to watch this for now.  We again thorough discussion about following her conservatively since she is doing so well.  All question concerns were answered and addressed.  Follow-up will be as needed.

## 2017-12-16 ENCOUNTER — Encounter (INDEPENDENT_AMBULATORY_CARE_PROVIDER_SITE_OTHER): Payer: Self-pay | Admitting: *Deleted

## 2018-01-21 ENCOUNTER — Ambulatory Visit: Payer: Self-pay

## 2018-01-22 ENCOUNTER — Ambulatory Visit: Payer: Self-pay

## 2018-01-28 ENCOUNTER — Ambulatory Visit: Payer: Self-pay

## 2018-01-28 DIAGNOSIS — G4700 Insomnia, unspecified: Secondary | ICD-10-CM | POA: Diagnosis not present

## 2018-01-28 DIAGNOSIS — M797 Fibromyalgia: Secondary | ICD-10-CM | POA: Diagnosis not present

## 2018-01-28 DIAGNOSIS — F3341 Major depressive disorder, recurrent, in partial remission: Secondary | ICD-10-CM | POA: Diagnosis not present

## 2018-01-28 DIAGNOSIS — E669 Obesity, unspecified: Secondary | ICD-10-CM | POA: Diagnosis not present

## 2018-01-28 DIAGNOSIS — F6089 Other specific personality disorders: Secondary | ICD-10-CM | POA: Diagnosis not present

## 2018-02-04 ENCOUNTER — Ambulatory Visit (INDEPENDENT_AMBULATORY_CARE_PROVIDER_SITE_OTHER): Payer: Medicare Other

## 2018-02-04 VITALS — BP 110/70 | HR 76 | Temp 97.4°F | Resp 16 | Ht 68.0 in | Wt 238.0 lb

## 2018-02-04 DIAGNOSIS — F329 Major depressive disorder, single episode, unspecified: Secondary | ICD-10-CM

## 2018-02-04 DIAGNOSIS — Z1231 Encounter for screening mammogram for malignant neoplasm of breast: Secondary | ICD-10-CM

## 2018-02-04 DIAGNOSIS — Z23 Encounter for immunization: Secondary | ICD-10-CM | POA: Diagnosis not present

## 2018-02-04 DIAGNOSIS — Z0000 Encounter for general adult medical examination without abnormal findings: Secondary | ICD-10-CM

## 2018-02-04 NOTE — Progress Notes (Signed)
Subjective:   Theophilus KindsBetty L Cordaro is a 67 y.o. female who presents for Medicare Annual (Subsequent) preventive examination.  Review of Systems:   Advanced age female > 7665, obesity BMI > 30     Objective:     Vitals: BP 110/70 (BP Location: Left Arm, Patient Position: Sitting, Cuff Size: Large)   Pulse 76   Temp (!) 97.4 F (36.3 C) (Oral)   Resp 16   Ht 5\' 8"  (1.727 m)   Wt 238 lb (108 kg)   BMI 36.19 kg/m   Body mass index is 36.19 kg/m.  Advanced Directives 02/04/2018 07/04/2017 03/28/2017 01/20/2017 11/05/2016 09/17/2016 09/28/2015  Does Patient Have a Medical Advance Directive? No No No No No No No  Would patient like information on creating a medical advance directive? Yes (MAU/Ambulatory/Procedural Areas - Information given) No - Patient declined Yes (MAU/Ambulatory/Procedural Areas - Information given) Yes (MAU/Ambulatory/Procedural Areas - Information given) - - Yes - Educational materials given    Tobacco Social History   Tobacco Use  Smoking Status Never Smoker  Smokeless Tobacco Never Used     Counseling given: Not Answered   Clinical Intake:  Pre-visit preparation completed: Yes  Pain : No/denies pain     Nutritional Status: BMI > 30  Obese Nutritional Risks: None Diabetes: No  How often do you need to have someone help you when you read instructions, pamphlets, or other written materials from your doctor or pharmacy?: 1 - Never What is the last grade level you completed in school?: 12th grade  Interpreter Needed?: No  Information entered by :: Reather LittlerKasey Nelson Noone LPN  Past Medical History:  Diagnosis Date  . Allergy   . Anxiety   . Depression   . History of shingles    ON AND OFF 6 YEARS, recurrent episodes of internal and external shingles with resulting neuralgia  . Neuromuscular disorder (HCC)    POSTHERPETIC NEURALGIA, left wrist weakness  . Pneumonia    HX   SEVERAL TIMES  NONE IN 3 YRS   Past Surgical History:  Procedure Laterality Date  .  BLADDER REPAIR    . COLON SURGERY  2011   10 inches removed for benign mass, does not absorb nutrients as well  . COLONOSCOPY  2011   polyp removed- Dr Marva PandaSkulskie  . COLONOSCOPY WITH PROPOFOL N/A 07/04/2017   Procedure: COLONOSCOPY WITH PROPOFOL;  Surgeon: Midge MiniumWohl, Darren, MD;  [facility];  Service: Endoscopy;  Laterality: N/A;  recurrent shingles  . NASAL SINUS SURGERY    . TONSILLECTOMY    . TOTAL HIP ARTHROPLASTY Left 10/10/2015   Procedure: LEFT TOTAL HIP ARTHROPLASTY ANTERIOR APPROACH;  Surgeon: Kathryne Hitchhristopher Y Blackman, MD;  Location: [facility] OR;  Service: Orthopedics;  Laterality: Left;  . TRIGGER FINGER RELEASE Left 03/28/2017   Procedure: MIDDLE FINGER DUPREYTRENS RELEASE;  Surgeon: Erin SonsKernodle, Harold, MD;  [facility];  Service: Orthopedics;  Laterality: Left;  . TUBAL LIGATION    . VAGINAL HYSTERECTOMY     Family History  Problem Relation Age of Onset  . Lymphoma Mother   . CAD Father   . Breast cancer Sister [id number]   Social History   Socioeconomic History  . Marital status: Married    Spouse name: Not on file  . Number of children: 3  . Years of education: Not on file  . Highest education level: High school graduate  Occupational History  . Occupation: Retired  Engineer, productionocial Needs  . Financial resource strain: Not hard at all  .  Food insecurity:    Worry: Never true    Inability: Never true  . Transportation needs:    Medical: No    Non-medical: No  Tobacco Use  . Smoking status: Never Smoker  . Smokeless tobacco: Never Used  Substance and Sexual Activity  . Alcohol use: No    Alcohol/week: 0.0 standard drinks  . Drug use: No  . Sexual activity: Yes  Lifestyle  . Physical activity:    Days per week: 0 days    Minutes per session: 0 min  . Stress: Not at all  Relationships  . Social connections:    Talks on phone: More than three times a week    Gets together: Once a week    Attends religious service: Never    Active member of club or  organization: Yes    Attends meetings of clubs or organizations: Never    Relationship status: Married  Other Topics Concern  . Not on file  Social History Narrative  . Not on file    Outpatient Encounter Medications as of 02/04/2018  Medication Sig  . Cholecalciferol (VITAMIN D3) 5000 UNITS CAPS Take 1 capsule (5,000 Units total) by mouth daily.  . Cyanocobalamin (VITAMIN B-12) 5000 MCG SUBL Place 15,000 mcg under the tongue daily.  . DULoxetine (CYMBALTA) 60 MG capsule Take 2 capsules (120 mg total) by mouth daily.  . fluticasone (FLONASE) 50 MCG/ACT nasal spray Place 2 sprays into both nostrils daily.  . Multiple Vitamins-Minerals (MULTIVITAMIN WOMEN 50+) TABS Take 1 tablet by mouth daily.  . sodium chloride (OCEAN) 0.65 % SOLN nasal spray Place 1 spray into both nostrils as needed for congestion.  . traZODone (DESYREL) 50 MG tablet Take 1 tablet (50 mg total) by mouth at bedtime as needed for sleep.  . valACYclovir (VALTREX) 1000 MG tablet Take 1,000 mg by mouth 2 (two) times daily. Am and pm  . [DISCONTINUED] albuterol (PROVENTIL HFA;VENTOLIN HFA) 108 (90 Base) MCG/ACT inhaler Inhale 2 puffs into the lungs every 4 (four) hours as needed for wheezing or shortness of breath. Dispense with aerochamber  . [DISCONTINUED] baclofen (LIORESAL) 10 MG tablet Take 1 tablet (10 mg total) by mouth at bedtime.  . [DISCONTINUED] methylPREDNISolone (MEDROL) 4 MG tablet Take as directed   No facility-administered encounter medications on file as of 02/04/2018.     Activities of Daily Living In your present state of health, do you have any difficulty performing the following activities: 02/04/2018 07/04/2017  Hearing? N N  Comment declines hearing aids -  Vision? N N  Comment wears reading glasses -  Difficulty concentrating or making decisions? N N  Walking or climbing stairs? N N  Dressing or bathing? N -  Doing errands, shopping? N -  Preparing Food and eating ? N -  Using the Toilet? N -    In the past six months, have you accidently leaked urine? N -  Do you have problems with loss of bowel control? N -  Managing your Medications? N -  Managing your Finances? N -  Housekeeping or managing your Housekeeping? N -  Some recent data might be hidden    Patient Care Team: Reubin Milan, MD as PCP - General (Internal Medicine) Vernie Murders, MD as Consulting Physician (Otolaryngology) Doloris Hall (Psychiatry)    Assessment:   This is a routine wellness examination for Tesneem.  Exercise Activities and Dietary recommendations Current Exercise Habits: The patient does not participate in regular exercise at present, Exercise limited by:  orthopedic condition(s)(hip replacement)  Goals    . Exercise 150 min/wk Moderate Activity     Recommend to exercise 3-5 times per week, approx 150 minutes per week       Fall Risk Fall Risk  02/04/2018 01/20/2017 11/05/2016 10/18/2016 04/17/2016  Falls in the past year? 0 No No No No  Number falls in past yr: 0 - - - -  Comment - - - - -  Injury with Fall? - - - - -   FALL RISK PREVENTION PERTAINING TO THE HOME:  Any stairs in or around the home WITH handrails? Yes  Home free of loose throw rugs in walkways, pet beds, electrical cords, etc? Yes  Adequate lighting in your home to reduce risk of falls? Yes   ASSISTIVE DEVICES UTILIZED TO PREVENT FALLS:  Life alert? No  Use of a cane, walker or w/c? No  Grab bars in the bathroom? No  Shower chair or bench in shower? No  Elevated toilet seat or a handicapped toilet? No   DME ORDERS:  DME order needed?  No   TIMED UP AND GO:  Was the test performed? Yes .  Length of time to ambulate 10 feet: 6 sec.   GAIT:  Appearance of gait: Gait stead-fast and without the use of an assistive device.   Education: Fall risk prevention has been discussed.  Intervention(s) required? No   Depression Screen PHQ 2/9 Scores 02/04/2018 10/24/2017 01/20/2017 01/20/2017  PHQ - 2 Score 0 0 1 1   PHQ- 9 Score 0 0 10 -     Cognitive Function     6CIT Screen 02/04/2018 01/20/2017  What Year? 0 points 0 points  What month? 0 points 0 points  What time? 0 points 0 points  Count back from 20 0 points 0 points  Months in reverse 0 points 0 points  Repeat phrase 0 points 4 points  Total Score 0 4    Immunization History  Administered Date(s) Administered  . Influenza, High Dose Seasonal PF 11/05/2016, 02/04/2018  . Influenza,inj,Quad PF,6+ Mos 01/03/2015, 01/22/2016  . Pneumococcal Conjugate-13 10/03/2014  . Pneumococcal Polysaccharide-23 05/08/2015  . Tdap 10/03/2014  . Zoster 05/30/2011    Qualifies for Shingles Vaccine? Yes  Zostavax completed 2013. Due for Shingrix. Education has been provided regarding the importance of this vaccine. Pt has been advised to call insurance company to determine out of pocket expense. Advised may also receive vaccine at local pharmacy or Health Dept. Verbalized acceptance and understanding.  Tdap: Up to date  Flu Vaccine: Due for Flu vaccine. Does the patient want to receive this vaccine today?  Yes . Education has been provided regarding the importance of this vaccine but still declined. Advised may receive this vaccine at local pharmacy or Health Dept. Aware to provide a copy of the vaccination record if obtained from local pharmacy or Health Dept. Verbalized acceptance and understanding.  Pneumococcal Vaccine: Up to date    Health Maintenance  Topic Date Due  . INFLUENZA VACCINE  09/18/2017  . MAMMOGRAM  02/20/2018  . TETANUS/TDAP  10/02/2024  . COLONOSCOPY  07/05/2027  . DEXA SCAN  Completed  . Hepatitis C Screening  Completed  . PNA vac Low Risk Adult  Completed   Screening Tests   Cancer Screenings:  Colorectal Screening: Completed 07/04/17. Repeat every 10 years;   Mammogram: Completed 02/20/17. Repeat every year. Ordered today. Pt provided with contact information and advised to call to schedule appt.   Bone Density:  Completed 02/20/17. Results reflect NORMAL, . Repeat every 2 years.   Lung Cancer Screening: (Low Dose CT Chest recommended if Age 13-80 years, 30 pack-year currently smoking OR have quit w/in 15years.) does not qualify.   Additional Screening:  Hepatitis C Screening: does qualify; Completed 01/20/17  Vision Screening: Recommended annual ophthalmology exams for early detection of glaucoma and other disorders of the eye. Is the patient up to date with their annual eye exam?  Yes  Who is the provider or what is the name of the office in which the pt attends annual eye exams? [facility]  Dental Screening: Recommended annual dental exams for proper oral hygiene  Community Resource Referral:  CRR required this visit?  No      Plan:    I have personally reviewed and addressed the Medicare Annual Wellness questionnaire and have noted the following in the patient's chart:  A. Medical and social history B. Use of alcohol, tobacco or illicit drugs  C. Current medications and supplements D. Functional ability and status E.  Nutritional status F.  Physical activity G. Advance directives H. List of other physicians I.  Hospitalizations, surgeries, and ER visits in previous 12 months J.  Vitals K. Screenings such as hearing and vision if needed, cognitive and depression L. Referrals and appointments   In addition, I have reviewed and discussed with patient certain preventive protocols, quality metrics, and best practice recommendations. A written personalized care plan for preventive services as well as general preventive health recommendations were provided to patient.   Signed,  Reather Littler, LPN Nurse Health Advisor   Nurse Notes: none. Pt doing well and appreciative of visit today.

## 2018-02-04 NOTE — Patient Instructions (Signed)
[name] , Thank you for taking time to come for your Medicare Wellness Visit. I appreciate your ongoing commitment to your health goals. Please review the following plan we discussed and let me know if I can assist you in the future.   Screening recommendations/referrals: Colonoscopy: done 07/04/17 repeat in 10 years  Mammogram: done 02/20/17. Please call [phone] to schedule your mammogram.  Bone Density: done 02/20/17 repeat in 2021 Recommended yearly ophthalmology/optometry visit for glaucoma screening and checkup Recommended yearly dental visit for hygiene and checkup  Vaccinations: Influenza vaccine: done today Pneumococcal vaccine: done 05/08/15 Tdap vaccine: done 10/03/14 Shingles vaccine: Shingrix discussed. Please contact your pharmacy for coverage information.     Advanced directives: Advance directive discussed with you today. I have provided a copy for you to complete at home and have notarized. Once this is complete please bring a copy in to our office so we can scan it into your chart.  Conditions/risks identified: Recommend joining silver sneakers to increase physical activity.  Next appointment: Please follow up in one year for your Medicare Annual Wellness visit.     Preventive Care 3365 Years and Older, Female Preventive care refers to lifestyle choices and visits with your health care provider that can promote health and wellness. What does preventive care include?  A yearly physical exam. This is also called an annual well check.  Dental exams once or twice a year.  Routine eye exams. Ask your health care provider how often you should have your eyes checked.  Personal lifestyle choices, including:  Daily care of your teeth and gums.  Regular physical activity.  Eating a healthy diet.  Avoiding tobacco and drug use.  Limiting alcohol use.  Practicing safe sex.  Taking low-dose aspirin every day.  Taking vitamin and mineral supplements as recommended  by your health care provider. What happens during an annual well check? The services and screenings done by your health care provider during your annual well check will depend on your age, overall health, lifestyle risk factors, and family history of disease. Counseling  Your health care provider may ask you questions about your:  Alcohol use.  Tobacco use.  Drug use.  Emotional well-being.  Home and relationship well-being.  Sexual activity.  Eating habits.  History of falls.  Memory and ability to understand (cognition).  Work and work Astronomerenvironment.  Reproductive health. Screening  You may have the following tests or measurements:  Height, weight, and BMI.  Blood pressure.  Lipid and cholesterol levels. These may be checked every 5 years, or more frequently if you are over 68 years old.  Skin check.  Lung cancer screening. You may have this screening every year starting at age 68 if you have a 30-pack-year history of smoking and currently smoke or have quit within the past 15 years.  Fecal occult blood test (FOBT) of the stool. You may have this test every year starting at age 68.  Flexible sigmoidoscopy or colonoscopy. You may have a sigmoidoscopy every 5 years or a colonoscopy every 10 years starting at age 450.  Hepatitis C blood test.  Hepatitis B blood test.  Sexually transmitted disease (STD) testing.  Diabetes screening. This is done by checking your blood sugar (glucose) after you have not eaten for a while (fasting). You may have this done every 1-3 years.  Bone density scan. This is done to screen for osteoporosis. You may have this done starting at age 68.  Mammogram. This may be done every 1-2  years. Talk to your health care provider about how often you should have regular mammograms. Talk with your health care provider about your test results, treatment options, and if necessary, the need for more tests. Vaccines  Your health care provider may  recommend certain vaccines, such as:  Influenza vaccine. This is recommended every year.  Tetanus, diphtheria, and acellular pertussis (Tdap, Td) vaccine. You may need a Td booster every 10 years.  Zoster vaccine. You may need this after age 95.  Pneumococcal 13-valent conjugate (PCV13) vaccine. One dose is recommended after age 74.  Pneumococcal polysaccharide (PPSV23) vaccine. One dose is recommended after age 2. Talk to your health care provider about which screenings and vaccines you need and how often you need them. This information is not intended to replace advice given to you by your health care provider. Make sure you discuss any questions you have with your health care provider. Document Released: 03/03/2015 Document Revised: 10/25/2015 Document Reviewed: 12/06/2014 Elsevier Interactive Patient Education  2017 [place] Prevention in the Home Falls can cause injuries. They can happen to people of all ages. There are many things you can do to make your home safe and to help prevent falls. What can I do on the outside of my home?  Regularly fix the edges of walkways and driveways and fix any cracks.  Remove anything that might make you trip as you walk through a door, such as a raised step or threshold.  Trim any bushes or trees on the path to your home.  Use bright outdoor lighting.  Clear any walking paths of anything that might make someone trip, such as rocks or tools.  Regularly check to see if handrails are loose or broken. Make sure that both sides of any steps have handrails.  Any raised decks and porches should have guardrails on the edges.  Have any leaves, snow, or ice cleared regularly.  Use sand or salt on walking paths during winter.  Clean up any spills in your garage right away. This includes oil or grease spills. What can I do in the bathroom?  Use night lights.  Install grab bars by the toilet and in the tub and shower. Do not use towel  bars as grab bars.  Use non-skid mats or decals in the tub or shower.  If you need to sit down in the shower, use a plastic, non-slip stool.  Keep the floor dry. Clean up any water that spills on the floor as soon as it happens.  Remove soap buildup in the tub or shower regularly.  Attach bath mats securely with double-sided non-slip rug tape.  Do not have throw rugs and other things on the floor that can make you trip. What can I do in the bedroom?  Use night lights.  Make sure that you have a light by your bed that is easy to reach.  Do not use any sheets or blankets that are too big for your bed. They should not hang down onto the floor.  Have a firm chair that has side arms. You can use this for support while you get dressed.  Do not have throw rugs and other things on the floor that can make you trip. What can I do in the kitchen?  Clean up any spills right away.  Avoid walking on wet floors.  Keep items that you use a lot in easy-to-reach places.  If you need to reach something above you, use a strong step  stool that has a grab bar.  Keep electrical cords out of the way.  Do not use floor polish or wax that makes floors slippery. If you must use wax, use non-skid floor wax.  Do not have throw rugs and other things on the floor that can make you trip. What can I do with my stairs?  Do not leave any items on the stairs.  Make sure that there are handrails on both sides of the stairs and use them. Fix handrails that are broken or loose. Make sure that handrails are as long as the stairways.  Check any carpeting to make sure that it is firmly attached to the stairs. Fix any carpet that is loose or worn.  Avoid having throw rugs at the top or bottom of the stairs. If you do have throw rugs, attach them to the floor with carpet tape.  Make sure that you have a light switch at the top of the stairs and the bottom of the stairs. If you do not have them, ask someone to  add them for you. What else can I do to help prevent falls?  Wear shoes that:  Do not have high heels.  Have rubber bottoms.  Are comfortable and fit you well.  Are closed at the toe. Do not wear sandals.  If you use a stepladder:  Make sure that it is fully opened. Do not climb a closed stepladder.  Make sure that both sides of the stepladder are locked into place.  Ask someone to hold it for you, if possible.  Clearly mark and make sure that you can see:  Any grab bars or handrails.  First and last steps.  Where the edge of each step is.  Use tools that help you move around (mobility aids) if they are needed. These include:  Canes.  Walkers.  Scooters.  Crutches.  Turn on the lights when you go into a dark area. Replace any light bulbs as soon as they burn out.  Set up your furniture so you have a clear path. Avoid moving your furniture around.  If any of your floors are uneven, fix them.  If there are any pets around you, be aware of where they are.  Review your medicines with your doctor. Some medicines can make you feel dizzy. This can increase your chance of falling. Ask your doctor what other things that you can do to help prevent falls. This information is not intended to replace advice given to you by your health care provider. Make sure you discuss any questions you have with your health care provider. Document Released: 12/01/2008 Document Revised: 07/13/2015 Document Reviewed: 03/11/2014 Elsevier Interactive Patient Education  2017 [employer].Influenza (Flu) Vaccine (Inactivated or Recombinant): What You Need to Know 1. Why get vaccinated? Influenza vaccine can prevent influenza (flu). Flu is a contagious disease that spreads around the [place] every year, usually between October and May. Anyone can get the flu, but it is more dangerous for some people. Infants and young children, people 93 years of age and older, pregnant women, and people  with certain health conditions or a weakened immune system are at greatest risk of flu complications. Pneumonia, bronchitis, sinus infections and ear infections are examples of flu-related complications. If you have a medical condition, such as heart disease, cancer or diabetes, flu can make it worse. Flu can cause fever and chills, sore throat, muscle aches, fatigue, cough, headache, and runny or stuffy nose. Some people may have  vomiting and diarrhea, though this is more common in children than adults. Each year thousands of people in the [place] die from flu, and many more are hospitalized. Flu vaccine prevents millions of illnesses and flu-related visits to the doctor each year. 2. Influenza vaccine CDC recommends everyone 61 months of age and older get vaccinated every flu season. Children 6 months through 69 years of age may need 2 doses during a single flu season. Everyone else needs only 1 dose each flu season. It takes about 2 weeks for protection to develop after vaccination. There are many flu viruses, and they are always changing. Each year a new flu vaccine is made to protect against three or four viruses that are likely to cause disease in the upcoming flu season. Even when the vaccine doesn't exactly match these viruses, it may still provide some protection. Influenza vaccine does not cause flu. Influenza vaccine may be given at the same time as other vaccines. 3. Talk with your health care provider Tell your vaccine provider if the person getting the vaccine:  Has had an allergic reaction after a previous dose of influenza vaccine, or has any severe, life-threatening allergies.  Has ever had Guillain-Barr Syndrome (also called GBS). In some cases, your health care provider may decide to postpone influenza vaccination to a future visit. People with minor illnesses, such as a cold, may be vaccinated. People who are moderately or severely ill should usually wait until they recover  before getting influenza vaccine. Your health care provider can give you more information. 4. Risks of a vaccine reaction  Soreness, redness, and swelling where shot is given, fever, muscle aches, and headache can happen after influenza vaccine.  There may be a very small increased risk of Guillain-Barr Syndrome (GBS) after inactivated influenza vaccine (the flu shot). Young children who get the flu shot along with pneumococcal vaccine (PCV13), and/or DTaP vaccine at the same time might be slightly more likely to have a seizure caused by fever. Tell your health care provider if a child who is getting flu vaccine has ever had a seizure. People sometimes faint after medical procedures, including vaccination. Tell your provider if you feel dizzy or have vision changes or ringing in the ears. As with any medicine, there is a very remote chance of a vaccine causing a severe allergic reaction, other serious injury, or death. 5. What if there is a serious problem? An allergic reaction could occur after the vaccinated person leaves the clinic. If you see signs of a severe allergic reaction (hives, swelling of the face and throat, difficulty breathing, a fast heartbeat, dizziness, or weakness), call 9-1-1 and get the person to the nearest hospital. For other signs that concern you, call your health care provider. Adverse reactions should be reported to the Vaccine Adverse Event Reporting System (VAERS). Your health care provider will usually file this report, or you can do it yourself. Visit the VAERS website at www.vaers.LAgents.no or call (281) 376-5872.VAERS is only for reporting reactions, and VAERS staff do not give medical advice. 6. The National Vaccine Injury Compensation Program The [employer] Vaccine Injury Compensation Program (VICP) is a federal program that was created to compensate people who may have been injured by certain vaccines. Visit the VICP website at SpiritualWord.at or call  828-782-9285 to learn about the program and about filing a claim. There is a time limit to file a claim for compensation. 7. How can I learn more?  Ask your healthcare provider.  Call your local  or state health department.  Contact the Centers for Disease Control and Prevention (CDC): ? Call (864)855-4900 (1-800-CDC-INFO) or ? Visit CDC's BiotechRoom.com.cy Vaccine Information Statement (Interim) Inactivated Influenza Vaccine (10/02/2017) This information is not intended to replace advice given to you by your health care provider. Make sure you discuss any questions you have with your health care provider. Document Released: 11/29/2005 Document Revised: 10/06/2017 Document Reviewed: 10/06/2017 Elsevier Interactive Patient Education  2019 [employer].

## 2018-03-09 ENCOUNTER — Telehealth (INDEPENDENT_AMBULATORY_CARE_PROVIDER_SITE_OTHER): Payer: Self-pay

## 2018-03-09 ENCOUNTER — Ambulatory Visit (INDEPENDENT_AMBULATORY_CARE_PROVIDER_SITE_OTHER): Payer: Medicare Other

## 2018-03-09 ENCOUNTER — Ambulatory Visit (INDEPENDENT_AMBULATORY_CARE_PROVIDER_SITE_OTHER): Payer: Medicare Other | Admitting: Orthopaedic Surgery

## 2018-03-09 ENCOUNTER — Encounter (INDEPENDENT_AMBULATORY_CARE_PROVIDER_SITE_OTHER): Payer: Self-pay | Admitting: Orthopaedic Surgery

## 2018-03-09 DIAGNOSIS — G8929 Other chronic pain: Secondary | ICD-10-CM | POA: Diagnosis not present

## 2018-03-09 DIAGNOSIS — M25561 Pain in right knee: Secondary | ICD-10-CM

## 2018-03-09 DIAGNOSIS — M1711 Unilateral primary osteoarthritis, right knee: Secondary | ICD-10-CM | POA: Diagnosis not present

## 2018-03-09 NOTE — Progress Notes (Signed)
Office Visit Note   Patient: Ann Peters           Date of Birth: 11-20-1950           MRN: [id number] Visit Date: 03/09/2018              Requested by: Reubin Milan, MD [address] [place], [place] [address] PCP: Reubin Milan, MD   Assessment & Plan: Visit Diagnoses:  1. Chronic pain of right knee   2. Unilateral primary osteoarthritis, right knee     Plan: Based on her x-ray findings and her clinical exam findings I think she would benefit best from just a steroid injection in her knee today and follow this up with hyaluronic acid in 4 weeks to treat the pain from mild arthritis.  She agrees with this treatment plan.  She understands the risk and benefits of injections.  All question concerns were answered and addressed.  She tolerated steroid injection well in her right knee today.  We will see her back in 4 weeks for hyaluronic acid.  Follow-Up Instructions: Return in about 4 weeks (around 04/06/2018).   Orders:  Orders Placed This Encounter  Procedures  . XR KNEE 3 VIEW RIGHT   No orders of the defined types were placed in this encounter.     Procedures: No procedures performed   Clinical Data: No additional findings.   Subjective: Chief Complaint  Patient presents with  . Right Knee - Pain  Patient is a very pleasant 68 year old that we have seen before and performed a hip replacement on.  She comes in with right knee pain is been hurting for some time.  She reports the knee feels like it will give out on her.  She takes over-the-counter medicine.  She said the pain radiates when she is going up and down stairs and hurts mainly around the knee.  She never has had any injury to that knee.  It does wake her up at night.  She has not a diabetic.  She just takes over-the-counter medicines to help.  HPI  Review of Systems She currently denies any headache, chest pain, shortness of breath, fever, chills, nausea, vomiting  Objective: Vital  Signs: There were no vitals taken for this visit.  Physical Exam She is alert and orient x3 and in no acute distress Ortho Exam Examination of her right knee shows no effusion as well.  She has global tenderness but no significant patellofemoral crepitation.  The knee is ligamentously stable with full range of motion. Specialty Comments:  No specialty comments available.  Imaging: Xr Knee 3 View Right  Result Date: 03/09/2018 3 views of the right knee were obtained and show no acute findings.  There is no significant arthritis that is seen either.  The joint space is well-maintained and the alignment is well-maintained.    PMFS History: Patient Active Problem List   Diagnosis Date Noted  . Depression 02/04/2018  . Cluster B personality disorder (HCC) 08/06/2017  . Personal history of colonic polyps   . Anemia 02/17/2017  . Insomnia 11/05/2016  . Sinusitis 10/18/2016  . Osteoarthritis of left hip 10/10/2015  . Status post left hip replacement 10/10/2015  . Onychomycosis of left great toe 05/10/2015  . Prediabetes 01/03/2015  . Anxiety 09/01/2014  . Diverticulitis 09/01/2014  . Vitamin D deficiency 09/01/2014  . Herpes zoster 09/01/2014  . FH: diabetes mellitus 09/01/2014  . FHx: heart disease 09/01/2014  . FH: lymphoma 09/01/2014  .  Obesity, Class II, BMI 35-39.9, no comorbidity 09/01/2014  . Fibromyalgia 11/15/2013  . Recurrent major depressive disorder, in partial remission (HCC) 11/15/2013  . Vitamin B12 deficiency 12/23/2011  . Allergic rhinitis 04/18/2011   Past Medical History:  Diagnosis Date  . Allergy   . Anxiety   . Depression   . History of shingles    ON AND OFF 6 YEARS, recurrent episodes of internal and external shingles with resulting neuralgia  . Neuromuscular disorder (HCC)    POSTHERPETIC NEURALGIA, left wrist weakness  . Pneumonia    HX   SEVERAL TIMES  NONE IN 3 YRS    Family History  Problem Relation Age of Onset  . Lymphoma Mother   . CAD  Father   . Breast cancer Sister 84    Past Surgical History:  Procedure Laterality Date  . BLADDER REPAIR    . COLON SURGERY  2011   10 inches removed for benign mass, does not absorb nutrients as well  . COLONOSCOPY  2011   polyp removed- Dr Marva Panda  . COLONOSCOPY WITH PROPOFOL N/A 07/04/2017   Procedure: COLONOSCOPY WITH PROPOFOL;  Surgeon: Midge Minium, MD;  Location: [facility];  Service: Endoscopy;  Laterality: N/A;  recurrent shingles  . NASAL SINUS SURGERY    . TONSILLECTOMY    . TOTAL HIP ARTHROPLASTY Left 10/10/2015   Procedure: LEFT TOTAL HIP ARTHROPLASTY ANTERIOR APPROACH;  Surgeon: Kathryne Hitch, MD;  Location: [facility] OR;  Service: Orthopedics;  Laterality: Left;  . TRIGGER FINGER RELEASE Left 03/28/2017   Procedure: MIDDLE FINGER DUPREYTRENS RELEASE;  Surgeon: Erin Sons, MD;  Location: [facility];  Service: Orthopedics;  Laterality: Left;  . TUBAL LIGATION    . VAGINAL HYSTERECTOMY     Social History   Occupational History  . Occupation: Retired  Tobacco Use  . Smoking status: Never Smoker  . Smokeless tobacco: Never Used  Substance and Sexual Activity  . Alcohol use: No    Alcohol/week: 0.0 standard drinks  . Drug use: No  . Sexual activity: Yes

## 2018-03-09 NOTE — Telephone Encounter (Signed)
Please submit for gel Injection right knee- Dr Magnus Ivan.

## 2018-03-12 NOTE — Telephone Encounter (Signed)
Noted  

## 2018-03-16 ENCOUNTER — Ambulatory Visit (INDEPENDENT_AMBULATORY_CARE_PROVIDER_SITE_OTHER): Payer: Medicare Other | Admitting: Internal Medicine

## 2018-03-16 ENCOUNTER — Encounter: Payer: Self-pay | Admitting: Internal Medicine

## 2018-03-16 VITALS — BP 104/68 | HR 84 | Ht 68.0 in | Wt 235.0 lb

## 2018-03-16 DIAGNOSIS — H6690 Otitis media, unspecified, unspecified ear: Secondary | ICD-10-CM | POA: Diagnosis not present

## 2018-03-16 DIAGNOSIS — J0101 Acute recurrent maxillary sinusitis: Secondary | ICD-10-CM

## 2018-03-16 MED ORDER — NEOMYCIN-POLYMYXIN-HC 3.5-10000-1 OT SOLN: 3.0000 [drp] | Freq: Three times a day (TID) | OTIC | 0 refills | Status: AC

## 2018-03-16 MED ORDER — AZITHROMYCIN 250 MG PO TABS: ORAL_TABLET | ORAL | 0 refills | Status: AC

## 2018-03-16 NOTE — Progress Notes (Signed)
Date:  03/16/2018   Name:  Ann Peters   DOB:  01-Jan-1951   MRN:  [id number]   Chief Complaint: Ear Pain (R) ear X 2 weeks. Tried using peroxide and alcohol and no relief. Wants Neomycin ear drops.  )  Otalgia   There is pain in the right ear. This is a new problem. The current episode started 1 to 4 weeks ago. The problem occurs constantly. The problem has been unchanged. There has been no fever. Associated symptoms include a sore throat. Pertinent negatives include no coughing, diarrhea, headaches or vomiting.  Sinusitis  This is a recurrent problem. There has been no fever. The pain is mild. Associated symptoms include congestion, ear pain, sinus pressure and a sore throat. Pertinent negatives include no chills, coughing, headaches or shortness of breath. Past treatments include acetaminophen. The treatment provided mild relief.    Review of Systems  Constitutional: Negative for chills.  HENT: Positive for congestion, ear pain, sinus pressure and sore throat.   Respiratory: Negative for cough, chest tightness and shortness of breath.   Cardiovascular: Negative for chest pain, palpitations and leg swelling.  Gastrointestinal: Negative for diarrhea and vomiting.  Musculoskeletal: Positive for arthralgias.  Allergic/Immunologic: Negative for environmental allergies.  Neurological: Negative for dizziness, light-headedness and headaches.    Patient Active Problem List   Diagnosis Date Noted  . Depression 02/04/2018  . Cluster B personality disorder (HCC) 08/06/2017  . Personal history of colonic polyps   . Anemia 02/17/2017  . Insomnia 11/05/2016  . Sinusitis 10/18/2016  . Osteoarthritis of left hip 10/10/2015  . Status post left hip replacement 10/10/2015  . Onychomycosis of left great toe 05/10/2015  . Prediabetes 01/03/2015  . Anxiety 09/01/2014  . Diverticulitis 09/01/2014  . Vitamin D deficiency 09/01/2014  . Herpes zoster 09/01/2014  . FH: diabetes mellitus 09/01/2014    . FHx: heart disease 09/01/2014  . FH: lymphoma 09/01/2014  . Obesity, Class II, BMI 35-39.9, no comorbidity 09/01/2014  . Fibromyalgia 11/15/2013  . Recurrent major depressive disorder, in partial remission (HCC) 11/15/2013  . Vitamin B12 deficiency 12/23/2011  . Allergic rhinitis 04/18/2011    Allergies  Allergen Reactions  . Amoxicillin-Pot Clavulanate Nausea And Vomiting and Other (See Comments)    Other reaction(s): Vomiting  . Beef-Derived Products Nausea And Vomiting  . Pegademase Bovine Nausea And Vomiting  . Penicillins Nausea And Vomiting and Other (See Comments)    Has patient had a PCN reaction causing immediate rash, facial/tongue/throat swelling, SOB or lightheadedness with hypotension: no Has patient had a PCN reaction causing severe rash involving mucus membranes or skin necrosis: No Has patient had a PCN reaction that required hospitalization No Has patient had a PCN reaction occurring within the last 10 years: Yes If all of the above answers are "NO", then may proceed with Cephalosporin use.         Past Surgical History:  Procedure Laterality Date  . BLADDER REPAIR    . COLON SURGERY  2011   10 inches removed for benign mass, does not absorb nutrients as well  . COLONOSCOPY  2011   polyp removed- Dr Marva Panda  . COLONOSCOPY WITH PROPOFOL N/A 07/04/2017   Procedure: COLONOSCOPY WITH PROPOFOL;  Surgeon: Midge Minium, MD;  [facility];  Service: Endoscopy;  Laterality: N/A;  recurrent shingles  . NASAL SINUS SURGERY    . TONSILLECTOMY    . TOTAL HIP ARTHROPLASTY Left 10/10/2015   Procedure: LEFT TOTAL HIP ARTHROPLASTY ANTERIOR APPROACH;  Surgeon: Kathryne Hitchhristopher Y Blackman, MD;  [facility];  Service: Orthopedics;  Laterality: Left;  . TRIGGER FINGER RELEASE Left 03/28/2017   Procedure: MIDDLE FINGER DUPREYTRENS RELEASE;  Surgeon: Erin SonsKernodle, Harold, MD;  [facility];  Service: Orthopedics;  Laterality: Left;  . TUBAL LIGATION     . VAGINAL HYSTERECTOMY      Social History   Tobacco Use  . Smoking status: Never Smoker  . Smokeless tobacco: Never Used  Substance Use Topics  . Alcohol use: No    Alcohol/week: 0.0 standard drinks  . Drug use: No     Medication list has been reviewed and updated.  Current Meds  Medication Sig  . Cholecalciferol (VITAMIN D3) 5000 UNITS CAPS Take 1 capsule (5,000 Units total) by mouth daily.  . Cyanocobalamin (VITAMIN B-12) 5000 MCG SUBL Place 15,000 mcg under the tongue daily.  . DULoxetine (CYMBALTA) 60 MG capsule Take 2 capsules (120 mg total) by mouth daily.  . fluticasone (FLONASE) 50 MCG/ACT nasal spray Place 2 sprays into both nostrils daily.  . Melatonin 1 MG TABS Take by mouth.  . Multiple Vitamins-Minerals (MULTIVITAMIN WOMEN 50+) TABS Take 1 tablet by mouth daily.  . sodium chloride (OCEAN) 0.65 % SOLN nasal spray Place 1 spray into both nostrils as needed for congestion.  . traZODone (DESYREL) 50 MG tablet Take 1 tablet (50 mg total) by mouth at bedtime as needed for sleep.  . valACYclovir (VALTREX) 1000 MG tablet Take 1,000 mg by mouth 2 (two) times daily. Am and pm    PHQ 2/9 Scores 03/16/2018 02/04/2018 10/24/2017 01/20/2017  PHQ - 2 Score 0 0 0 1  PHQ- 9 Score 0 0 0 10    Physical Exam Vitals signs and nursing note reviewed.  Constitutional:      General: She is not in acute distress.    Appearance: She is well-developed.  HENT:     Head: Normocephalic and atraumatic.     Right Ear: Tympanic membrane is erythematous and retracted.     Left Ear: Tympanic membrane is not erythematous or retracted.     Nose:     Right Sinus: Maxillary sinus tenderness and frontal sinus tenderness present.     Mouth/Throat:     Pharynx: Posterior oropharyngeal erythema present.  Neck:     Musculoskeletal: Normal range of motion and neck supple.  Cardiovascular:     Rate and Rhythm: Normal rate and regular rhythm.     Pulses: Normal pulses.  Pulmonary:     Effort:  Pulmonary effort is normal. No respiratory distress.  Musculoskeletal: Normal range of motion.  Skin:    General: Skin is warm and dry.     Findings: No rash.  Neurological:     Mental Status: She is alert and oriented to person, place, and time.  Psychiatric:        Behavior: Behavior normal.        Thought Content: Thought content normal.     BP 104/68   Pulse 84   Ht 5\' 8"  (1.727 m)   Wt 235 lb (106.6 kg)   SpO2 98%   BMI 35.73 kg/m   Assessment and Plan: 1. Acute recurrent maxillary sinusitis Continue Advil and heat as needed - azithromycin (ZITHROMAX Z-PAK) 250 MG tablet; UAD  Dispense: 6 each; Refill: 0  2. Acute otitis media, unspecified otitis media type - neomycin-polymyxin-hydrocortisone (CORTISPORIN) OTIC solution; Place 3 drops into the right ear 3 (three) times daily for 10 days.  Dispense: 10  mL; Refill: 0   Partially dictated using Animal nutritionist. Any errors are unintentional.  Bari Edward, MD [facility]  03/16/2018

## 2018-04-03 ENCOUNTER — Telehealth (INDEPENDENT_AMBULATORY_CARE_PROVIDER_SITE_OTHER): Payer: Self-pay

## 2018-04-03 NOTE — Telephone Encounter (Signed)
Submitted VOB for Monovisc, right knee. 

## 2018-04-09 ENCOUNTER — Telehealth (INDEPENDENT_AMBULATORY_CARE_PROVIDER_SITE_OTHER): Payer: Self-pay

## 2018-04-09 NOTE — Telephone Encounter (Signed)
Called and left a VM advising patient to call back and schedule an appointment for gel injection with Dr. Magnus Ivan.  Patient is approved for Monovisc, right knee. [employer]	[place] [facility] through insurance (Medicare) No Co-pay No PA required

## 2018-04-22 ENCOUNTER — Encounter (INDEPENDENT_AMBULATORY_CARE_PROVIDER_SITE_OTHER): Payer: Self-pay | Admitting: Orthopaedic Surgery

## 2018-04-22 ENCOUNTER — Ambulatory Visit (INDEPENDENT_AMBULATORY_CARE_PROVIDER_SITE_OTHER): Payer: Medicare Other | Admitting: Orthopaedic Surgery

## 2018-04-22 DIAGNOSIS — M1711 Unilateral primary osteoarthritis, right knee: Secondary | ICD-10-CM | POA: Diagnosis not present

## 2018-04-22 MED ORDER — LIDOCAINE HCL 1 % IJ SOLN
3.0000 mL | INTRAMUSCULAR | Status: AC | PRN
  Administered 2018-04-22: 3 mL

## 2018-04-22 MED ORDER — HYALURONAN 88 MG/4ML IX SOSY
88.0000 mg | PREFILLED_SYRINGE | INTRA_ARTICULAR | Status: AC | PRN
  Administered 2018-04-22: 88 mg via INTRA_ARTICULAR

## 2018-04-22 NOTE — Progress Notes (Signed)
   Procedure Note  Patient: Ann Peters             Date of Birth: [birth year]           MRN: [id number]             Visit Date: 04/22/2018  Procedures: Visit Diagnoses: Unilateral primary osteoarthritis, right knee  Large Joint Inj: R knee on 04/22/2018 1:56 PM Indications: diagnostic evaluation and pain Details: 22 G 1.5 in needle, superolateral approach  Arthrogram: No  Medications: 3 mL lidocaine 1 %; 88 mg Hyaluronan 88 MG/4ML Outcome: tolerated well, no immediate complications Procedure, treatment alternatives, risks and benefits explained, specific risks discussed. Consent was given by the patient. Immediately prior to procedure a time out was called to verify the correct patient, procedure, equipment, support staff and site/side marked as required. Patient was prepped and draped in the usual sterile fashion.    The patient is here today for scheduled hyaluronic acid injection in her right knee to treat the pain from osteoarthritis.  A steroid injection has helped as well.  She understands fully while she is having this injection.  We talked about the risk and benefits of it as well.  Her right knee shows no effusion today and it feels stable.  She does have some joint line tenderness and findings consistent with osteoarthritis.  She tolerated the Monovisc injection well in her right knee.  All questions concerns were answered and addressed.  Follow-up will be as needed.

## 2018-07-05 ENCOUNTER — Other Ambulatory Visit: Payer: Self-pay | Admitting: Internal Medicine

## 2018-07-06 ENCOUNTER — Other Ambulatory Visit: Payer: Self-pay

## 2018-07-06 ENCOUNTER — Ambulatory Visit (INDEPENDENT_AMBULATORY_CARE_PROVIDER_SITE_OTHER): Payer: Medicare Other | Admitting: Internal Medicine

## 2018-07-06 ENCOUNTER — Encounter: Payer: Self-pay | Admitting: Internal Medicine

## 2018-07-06 VITALS — BP 128/80 | HR 76 | Ht 68.0 in | Wt 235.0 lb

## 2018-07-06 DIAGNOSIS — G4700 Insomnia, unspecified: Secondary | ICD-10-CM | POA: Diagnosis not present

## 2018-07-06 DIAGNOSIS — F3341 Major depressive disorder, recurrent, in partial remission: Secondary | ICD-10-CM | POA: Diagnosis not present

## 2018-07-06 DIAGNOSIS — M62838 Other muscle spasm: Secondary | ICD-10-CM

## 2018-07-06 MED ORDER — TRAZODONE HCL 100 MG PO TABS: 100.0000 mg | ORAL_TABLET | Freq: Every evening | ORAL | 0 refills | Status: DC | PRN

## 2018-07-06 NOTE — Patient Instructions (Signed)
Please schedule your mammogram!

## 2018-07-06 NOTE — Progress Notes (Signed)
Date:  07/06/2018   Name:  Ann Peters   DOB:  10/10/1950   MRN:  [id number]   Chief Complaint: Headache (X 1 week on left side of head. Feeling a "zing, almost like blood rushing through head for one second." ) and Insomnia (Said she cannot get in with her psyciatrist office right now for refills. Wants to know if we can refill temperarily )  Headache   This is a new problem. The current episode started in the past 7 days. The problem occurs daily. The problem has been unchanged. The pain is located in the left unilateral region. The pain does not radiate. The quality of the pain is described as pulsating and band-like. The pain is at a severity of 1/10. The pain is mild. Associated symptoms include dizziness, insomnia and scalp tenderness. Pertinent negatives include no abnormal behavior, blurred vision, eye pain, eye redness, numbness, phonophobia, photophobia, sinus pressure, tinnitus, vomiting or weight loss. The symptoms are aggravated by activity. She has tried nothing for the symptoms. Her past medical history is significant for sinus disease. There is no history of cluster headaches, hypertension, migraines in the family or recent head traumas.  Insomnia  Past treatments include medication (trazodone). PMH includes: depression.  Depression         This is a chronic problem.The problem is unchanged.  Associated symptoms include insomnia and headaches.  Past treatments include SNRIs - Serotonin and norepinephrine reuptake inhibitors.  Compliance with treatment is good.  Previous treatment provided significant relief.   Pertinent negatives include no head trauma.   Review of Systems  Constitutional: Negative for weight loss.  HENT: Negative for sinus pressure and tinnitus.   Eyes: Negative for blurred vision, photophobia, pain and redness.  Gastrointestinal: Negative for vomiting.  Neurological: Positive for dizziness and headaches. Negative for numbness.  Psychiatric/Behavioral:  Positive for depression. The patient has insomnia.     Patient Active Problem List   Diagnosis Date Noted  . Depression 02/04/2018  . Cluster B personality disorder (HCC) 08/06/2017  . Personal history of colonic polyps   . Anemia 02/17/2017  . Insomnia 11/05/2016  . Sinusitis 10/18/2016  . Osteoarthritis of left hip 10/10/2015  . Status post left hip replacement 10/10/2015  . Onychomycosis of left great toe 05/10/2015  . Prediabetes 01/03/2015  . Anxiety 09/01/2014  . Diverticulitis 09/01/2014  . Vitamin D deficiency 09/01/2014  . Herpes zoster 09/01/2014  . FH: diabetes mellitus 09/01/2014  . FHx: heart disease 09/01/2014  . FH: lymphoma 09/01/2014  . Obesity, Class II, BMI 35-39.9, no comorbidity 09/01/2014  . Fibromyalgia 11/15/2013  . Recurrent major depressive disorder, in partial remission (HCC) 11/15/2013  . Vitamin B12 deficiency 12/23/2011  . Allergic rhinitis 04/18/2011    Allergies  Allergen Reactions  . Amoxicillin-Pot Clavulanate Nausea And Vomiting and Other (See Comments)    Other reaction(s): Vomiting  . Beef-Derived Products Nausea And Vomiting  . Pegademase Bovine Nausea And Vomiting  . Penicillins Nausea And Vomiting and Other (See Comments)    Has patient had a PCN reaction causing immediate rash, facial/tongue/throat swelling, SOB or lightheadedness with hypotension: no Has patient had a PCN reaction causing severe rash involving mucus membranes or skin necrosis: No Has patient had a PCN reaction that required hospitalization No Has patient had a PCN reaction occurring within the last 10 years: Yes If all of the above answers are "NO", then may proceed with Cephalosporin use.         Past  Surgical History:  Procedure Laterality Date  . BLADDER REPAIR    . COLON SURGERY  2011   10 inches removed for benign mass, does not absorb nutrients as well  . COLONOSCOPY  2011   polyp removed- Dr Marva PandaSkulskie  . COLONOSCOPY WITH PROPOFOL N/A 07/04/2017    Procedure: COLONOSCOPY WITH PROPOFOL;  Surgeon: Midge MiniumWohl, Darren, MD;  [facility];  Service: Endoscopy;  Laterality: N/A;  recurrent shingles  . NASAL SINUS SURGERY    . TONSILLECTOMY    . TOTAL HIP ARTHROPLASTY Left 10/10/2015   Procedure: LEFT TOTAL HIP ARTHROPLASTY ANTERIOR APPROACH;  Surgeon: Kathryne Hitchhristopher Y Blackman, MD;  Location: [facility];  Service: Orthopedics;  Laterality: Left;  . TRIGGER FINGER RELEASE Left 03/28/2017   Procedure: MIDDLE FINGER DUPREYTRENS RELEASE;  Surgeon: Erin SonsKernodle, Harold, MD;  [facility];  Service: Orthopedics;  Laterality: Left;  . TUBAL LIGATION    . VAGINAL HYSTERECTOMY      Social History   Tobacco Use  . Smoking status: Never Smoker  . Smokeless tobacco: Never Used  Substance Use Topics  . Alcohol use: No    Alcohol/week: 0.0 standard drinks  . Drug use: No     Medication list has been reviewed and updated.  Current Meds  Medication Sig  . Cholecalciferol (VITAMIN D3) 5000 UNITS CAPS Take 1 capsule (5,000 Units total) by mouth daily.  . Cyanocobalamin (VITAMIN B-12) 5000 MCG SUBL Place 15,000 mcg under the tongue daily.  . DULoxetine (CYMBALTA) 60 MG capsule Take 2 capsules (120 mg total) by mouth daily.  . fluticasone (FLONASE) 50 MCG/ACT nasal spray Place 2 sprays into both nostrils daily.  . Melatonin 1 MG TABS Take by mouth.  . Multiple Vitamins-Minerals (MULTIVITAMIN WOMEN 50+) TABS Take 1 tablet by mouth daily.  . sodium chloride (OCEAN) 0.65 % SOLN nasal spray Place 1 spray into both nostrils as needed for congestion.  . traZODone (DESYREL) 50 MG tablet Take 1 tablet (50 mg total) by mouth at bedtime as needed for sleep.  . valACYclovir (VALTREX) 1000 MG tablet TAKE 1 TABLET BY MOUTH EVERY DAY    PHQ 2/9 Scores 07/06/2018 03/16/2018 02/04/2018 10/24/2017  PHQ - 2 Score 0 0 0 0  PHQ- 9 Score - 0 0 0    BP Readings from Last 3 Encounters:  07/06/18 128/80  03/16/18 104/68  02/04/18 110/70    Physical Exam  Vitals signs and nursing note reviewed.  Constitutional:      General: She is not in acute distress.    Appearance: She is well-developed.  HENT:     Head: Normocephalic and atraumatic.     Jaw: There is normal jaw occlusion.   Eyes:     Extraocular Movements: Extraocular movements intact.     Right eye: Normal extraocular motion and no nystagmus.     Left eye: Normal extraocular motion and no nystagmus.  Neck:     Musculoskeletal: Normal range of motion.  Cardiovascular:     Rate and Rhythm: Normal rate and regular rhythm.     Heart sounds: Normal heart sounds. No murmur.  Pulmonary:     Effort: Pulmonary effort is normal. No respiratory distress.     Breath sounds: Normal breath sounds. No wheezing or rhonchi.  Musculoskeletal: Normal range of motion.  Lymphadenopathy:     Cervical: No cervical adenopathy.  Skin:    General: Skin is warm and dry.     Findings: No rash.  Neurological:     Mental  Status: She is alert and oriented to person, place, and time.  Psychiatric:        Attention and Perception: Attention normal.        Mood and Affect: Mood normal.        Speech: Speech normal.        Behavior: Behavior normal.        Thought Content: Thought content normal.        Cognition and Memory: Cognition normal.        Judgment: Judgment normal.     Wt Readings from Last 3 Encounters:  07/06/18 235 lb (106.6 kg)  03/16/18 235 lb (106.6 kg)  02/04/18 238 lb (108 kg)    BP 128/80   Pulse 76   Ht  (1.727 m)   Wt 235 lb (106.6 kg)   SpO2 97%   BMI 35.73 kg/m   Assessment and Plan: 1. Muscle spasm Recommend continuing magnesium supplement, heat and massage as needed  2. Insomnia, unspecified type Continue Trazodone - traZODone (DESYREL) 100 MG tablet; Take 1 tablet (100 mg total) by mouth at bedtime as needed for sleep.  Dispense: 90 tablet; Refill: 0  3. Recurrent major depressive disorder, in partial remission (HCC) Doing well on Cymbalta - continue  current dose   Partially dictated using Animal nutritionist. Any errors are unintentional.  Bari Edward, MD [facility]  07/06/2018

## 2018-07-24 DIAGNOSIS — L728 Other follicular cysts of the skin and subcutaneous tissue: Secondary | ICD-10-CM | POA: Diagnosis not present

## 2018-07-24 DIAGNOSIS — M26609 Unspecified temporomandibular joint disorder, unspecified side: Secondary | ICD-10-CM | POA: Diagnosis not present

## 2018-07-24 DIAGNOSIS — K219 Gastro-esophageal reflux disease without esophagitis: Secondary | ICD-10-CM | POA: Diagnosis not present

## 2018-07-24 DIAGNOSIS — H9201 Otalgia, right ear: Secondary | ICD-10-CM | POA: Diagnosis not present

## 2018-09-29 ENCOUNTER — Other Ambulatory Visit: Payer: Self-pay | Admitting: Internal Medicine

## 2018-09-29 DIAGNOSIS — G4700 Insomnia, unspecified: Secondary | ICD-10-CM

## 2018-11-17 ENCOUNTER — Encounter: Payer: Self-pay | Admitting: Internal Medicine

## 2018-11-17 ENCOUNTER — Ambulatory Visit (INDEPENDENT_AMBULATORY_CARE_PROVIDER_SITE_OTHER): Payer: Medicare Other | Admitting: Internal Medicine

## 2018-11-17 ENCOUNTER — Other Ambulatory Visit: Payer: Self-pay

## 2018-11-17 VITALS — BP 130/80 | HR 80 | Ht 68.0 in | Wt 235.0 lb

## 2018-11-17 DIAGNOSIS — B029 Zoster without complications: Secondary | ICD-10-CM

## 2018-11-17 DIAGNOSIS — Z1231 Encounter for screening mammogram for malignant neoplasm of breast: Secondary | ICD-10-CM | POA: Diagnosis not present

## 2018-11-17 DIAGNOSIS — Z23 Encounter for immunization: Secondary | ICD-10-CM | POA: Diagnosis not present

## 2018-11-17 DIAGNOSIS — R7303 Prediabetes: Secondary | ICD-10-CM | POA: Diagnosis not present

## 2018-11-17 DIAGNOSIS — Z6835 Body mass index (BMI) 35.0-35.9, adult: Secondary | ICD-10-CM

## 2018-11-17 DIAGNOSIS — F3341 Major depressive disorder, recurrent, in partial remission: Secondary | ICD-10-CM | POA: Diagnosis not present

## 2018-11-17 DIAGNOSIS — G4700 Insomnia, unspecified: Secondary | ICD-10-CM | POA: Diagnosis not present

## 2018-11-17 NOTE — Progress Notes (Signed)
Date:  11/17/2018   Name:  Ann Peters   DOB:  11-18-1950   MRN:  [id number]   Chief Complaint: Annual Exam and influ vacc need RICKY DOAN is a 68 y.o. female who presents today for her annual exam. She feels fairly well. She reports exercising. She reports she is sleeping fairly well. She denies breast issues.  Mammogram  02/2017 - ordered this month but not scheduled Colonoscopy   06/2017 Immunizations  Up to date DEXA 02/2017  Depression        This is a chronic problem.The problem is unchanged.  Associated symptoms include no fatigue and no headaches.  Past treatments include SSRIs - Selective serotonin reuptake inhibitors and other medications.  Compliance with treatment is good. Diabetes She presents for her follow-up diabetic visit. Diabetes type: pre-diabetes. Pertinent negatives for hypoglycemia include no dizziness, headaches, nervousness/anxiousness or tremors. Pertinent negatives for diabetes include no chest pain, no fatigue, no polydipsia and no polyuria. Symptoms are stable. When asked about current treatments, none were reported. She is compliant with treatment most of the time.   Lab Results  Component Value Date   HGBA1C 5.6 02/17/2017   Lab Results  Component Value Date   CREATININE 0.88 02/17/2017   BUN 20 02/17/2017   NA 141 02/17/2017   K 4.7 02/17/2017   CL 104 02/17/2017   CO2 22 02/17/2017   Lab Results  Component Value Date   CHOL 173 11/19/2013   HDL 51 11/19/2013   LDLCALC 100 11/19/2013   TRIG 111 11/19/2013     Review of Systems  Constitutional: Negative for chills, fatigue and fever.  HENT: Negative for congestion, hearing loss, tinnitus, trouble swallowing and voice change.   Eyes: Negative for visual disturbance.  Respiratory: Negative for cough, chest tightness, shortness of breath and wheezing.   Cardiovascular: Negative for chest pain, palpitations and leg swelling.  Gastrointestinal: Negative for abdominal pain, constipation,  diarrhea and vomiting.  Endocrine: Negative for polydipsia and polyuria.  Genitourinary: Negative for dysuria, frequency, genital sores, vaginal bleeding and vaginal discharge.  Musculoskeletal: Negative for arthralgias, gait problem and joint swelling.  Skin: Negative for color change and rash.  Neurological: Negative for dizziness, tremors, light-headedness and headaches.  Hematological: Negative for adenopathy. Does not bruise/bleed easily.  Psychiatric/Behavioral: Positive for depression. Negative for dysphoric mood and sleep disturbance. The patient is not nervous/anxious.     Patient Active Problem List   Diagnosis Date Noted  . Obesity, morbid, BMI 40.0-49.9 ([place]) 11/17/2018  . Personal history of colonic polyps   . Anemia 02/17/2017  . Insomnia 11/05/2016  . Osteoarthritis of left hip 10/10/2015  . Status post left hip replacement 10/10/2015  . Onychomycosis of left great toe 05/10/2015  . Prediabetes 01/03/2015  . Anxiety 09/01/2014  . Diverticulitis 09/01/2014  . Vitamin D deficiency 09/01/2014  . Herpes zoster 09/01/2014  . FH: diabetes mellitus 09/01/2014  . FHx: heart disease 09/01/2014  . FH: lymphoma 09/01/2014  . Obesity, Class II, BMI 35-39.9, no comorbidity 09/01/2014  . Fibromyalgia 11/15/2013  . Recurrent major depressive disorder, in partial remission ([place]) 11/15/2013  . Vitamin B12 deficiency 12/23/2011  . Allergic rhinitis 04/18/2011    Allergies  Allergen Reactions  . Amoxicillin-Pot Clavulanate Nausea And Vomiting and Other (See Comments)    Other reaction(s): Vomiting  . Beef-Derived Products Nausea And Vomiting  . Pegademase Bovine Nausea And Vomiting  . Penicillins Nausea And Vomiting and Other (See Comments)    Has patient had  a PCN reaction causing immediate rash, facial/tongue/throat swelling, SOB or lightheadedness with hypotension: no Has patient had a PCN reaction causing severe rash involving mucus membranes or skin necrosis: No Has  patient had a PCN reaction that required hospitalization No Has patient had a PCN reaction occurring within the last 10 years: Yes If all of the above answers are "NO", then may proceed with Cephalosporin use.         Past Surgical History:  Procedure Laterality Date  . BLADDER REPAIR    . COLON SURGERY  2011   10 inches removed for benign mass, does not absorb nutrients as well  . COLONOSCOPY  2011   polyp removed- Dr Marva Panda  . COLONOSCOPY WITH PROPOFOL N/A 07/04/2017   Procedure: COLONOSCOPY WITH PROPOFOL;  Surgeon: Midge Minium, MD;  Location: [facility];  Service: Endoscopy;  Laterality: N/A;  recurrent shingles  . NASAL SINUS SURGERY    . TONSILLECTOMY    . TOTAL HIP ARTHROPLASTY Left 10/10/2015   Procedure: LEFT TOTAL HIP ARTHROPLASTY ANTERIOR APPROACH;  Surgeon: Kathryne Hitch, MD;  Location: [facility];  Service: Orthopedics;  Laterality: Left;  . TRIGGER FINGER RELEASE Left 03/28/2017   Procedure: MIDDLE FINGER DUPREYTRENS RELEASE;  Surgeon: Erin Sons, MD;  Location: [facility];  Service: Orthopedics;  Laterality: Left;  . TUBAL LIGATION    . VAGINAL HYSTERECTOMY      Social History   Tobacco Use  . Smoking status: Never Smoker  . Smokeless tobacco: Never Used  Substance Use Topics  . Alcohol use: No    Alcohol/week: 0.0 standard drinks  . Drug use: No     Medication list has been reviewed and updated.  Current Meds  Medication Sig  . Cholecalciferol (VITAMIN D3) 5000 UNITS CAPS Take 1 capsule (5,000 Units total) by mouth daily.  . Cyanocobalamin (VITAMIN B-12) 5000 MCG SUBL Place 15,000 mcg under the tongue daily.  . DULoxetine (CYMBALTA) 60 MG capsule Take 2 capsules (120 mg total) by mouth daily.  . fluticasone (FLONASE) 50 MCG/ACT nasal spray Place 2 sprays into both nostrils daily.  . Melatonin 1 MG TABS Take by mouth.  . Multiple Vitamins-Minerals (MULTIVITAMIN WOMEN 50+) TABS Take 1 tablet by mouth daily.  . sodium chloride  (OCEAN) 0.65 % SOLN nasal spray Place 1 spray into both nostrils as needed for congestion.  . traZODone (DESYREL) 100 MG tablet TAKE 1 TABLET(100 MG) BY MOUTH AT BEDTIME AS NEEDED FOR SLEEP  . valACYclovir (VALTREX) 1000 MG tablet TAKE 1 TABLET BY MOUTH EVERY DAY    PHQ 2/9 Scores 11/17/2018 07/06/2018 03/16/2018 02/04/2018  PHQ - 2 Score 0 0 0 0  PHQ- 9 Score 0 - 0 0   GAD 7 : Generalized Anxiety Score 11/17/2018 10/18/2016  Nervous, Anxious, on Edge 0 3  Control/stop worrying 0 3  Worry too much - different things 0 3  Trouble relaxing 0 3  Restless 0 3  Easily annoyed or irritable 0 1  Afraid - awful might happen 0 3  Total GAD 7 Score 0 19  Anxiety Difficulty - Extremely difficult      BP Readings from Last 3 Encounters:  11/17/18 130/80  07/06/18 128/80  03/16/18 104/68    Physical Exam Vitals signs and nursing note reviewed.  Constitutional:      General: She is not in acute distress.    Appearance: She is well-developed.  HENT:     Head: Normocephalic and atraumatic.     Right Ear:  Tympanic membrane and ear canal normal.     Left Ear: Tympanic membrane and ear canal normal.     Nose:     Right Sinus: No maxillary sinus tenderness.     Left Sinus: No maxillary sinus tenderness.  Eyes:     General: No scleral icterus.       Right eye: No discharge.        Left eye: No discharge.     Conjunctiva/sclera: Conjunctivae normal.  Neck:     Musculoskeletal: Normal range of motion. No erythema.     Thyroid: No thyromegaly.     Vascular: No carotid bruit.  Cardiovascular:     Rate and Rhythm: Normal rate and regular rhythm.     Pulses: Normal pulses.     Heart sounds: Normal heart sounds.  Pulmonary:     Effort: Pulmonary effort is normal. No respiratory distress.     Breath sounds: No wheezing.  Chest:     Breasts:        Right: No mass, nipple discharge, skin change or tenderness.        Left: No mass, nipple discharge, skin change or tenderness.  Abdominal:      General: Bowel sounds are normal.     Palpations: Abdomen is soft.     Tenderness: There is no abdominal tenderness.  Musculoskeletal:     Right lower leg: No edema.     Left lower leg: No edema.  Lymphadenopathy:     Cervical: No cervical adenopathy.  Skin:    General: Skin is warm and dry.     Capillary Refill: Capillary refill takes less than 2 seconds.     Findings: No rash.  Neurological:     General: No focal deficit present.     Mental Status: She is alert and oriented to person, place, and time.     Cranial Nerves: No cranial nerve deficit.     Sensory: No sensory deficit.     Deep Tendon Reflexes: Reflexes are normal and symmetric.  Psychiatric:        Speech: Speech normal.        Behavior: Behavior normal.        Thought Content: Thought content normal.     Wt Readings from Last 3 Encounters:  11/17/18 235 lb (106.6 kg)  07/06/18 235 lb (106.6 kg)  03/16/18 235 lb (106.6 kg)    BP 130/80   Pulse 80   Ht 5\' 8"  (1.727 m)   Wt 235 lb (106.6 kg)   SpO2 96%   BMI 35.73 kg/m   Assessment and Plan: 1. Recurrent major depressive disorder, in partial remission (HCC) Clinically stable without SI/HI.  Tolerating medications well. Sleeping well with trazodone. No increase in BP, weight on current regimen - will continue - TSH  2. Prediabetes Continue dietary modifications - Comprehensive metabolic panel - Hemoglobin A1c - POCT urinalysis dipstick  3. Encounter for screening mammogram for breast cancer Pt will schedule  4. Insomnia, unspecified type Controlled on trazodone nightly  5. BMI 35.0-35.9,adult stable  6. Herpes zoster without complication Continues on daily suppressive therapy - CBC with Differential/Platelet  7. Need for immunization against influenza - Flu Vaccine QUAD High Dose(Fluad)   Partially dictated using Animal nutritionistDragon software. Any errors are unintentional.  Bari EdwardLaura Janelli Welling, MD [facility]   11/17/2018

## 2018-11-18 LAB — HEMOGLOBIN A1C
Est. average glucose Bld gHb Est-mCnc: 120 mg/dL
Hgb A1c MFr Bld: 5.8 % — ABNORMAL HIGH (ref 4.8–5.6)

## 2018-11-18 LAB — COMPREHENSIVE METABOLIC PANEL
ALT: 13 IU/L (ref 0–32)
AST: 15 IU/L (ref 0–40)
Albumin/Globulin Ratio: 1.8 (ref 1.2–2.2)
Albumin: 4.4 g/dL (ref 3.8–4.8)
Alkaline Phosphatase: 103 IU/L (ref 39–117)
BUN/Creatinine Ratio: 19 (ref 12–28)
BUN: 20 mg/dL (ref 8–27)
Bilirubin Total: 0.5 mg/dL (ref 0.0–1.2)
CO2: 23 mmol/L (ref 20–29)
Calcium: 9.8 mg/dL (ref 8.7–10.3)
Chloride: 100 mmol/L (ref 96–106)
Creatinine, Ser: 1.07 mg/dL — ABNORMAL HIGH (ref 0.57–1.00)
GFR calc Af Amer: 61 mL/min/{1.73_m2} (ref >59)
GFR calc non Af Amer: 53 mL/min/{1.73_m2} — ABNORMAL LOW (ref >59)
Globulin, Total: 2.5 g/dL (ref 1.5–4.5)
Glucose: 125 mg/dL — ABNORMAL HIGH (ref 65–99)
Potassium: 4.4 mmol/L (ref 3.5–5.2)
Sodium: 140 mmol/L (ref 134–144)
Total Protein: 6.9 g/dL (ref 6.0–8.5)

## 2018-11-18 LAB — CBC WITH DIFFERENTIAL/PLATELET
Basophils Absolute: 0 10*3/uL (ref 0.0–0.2)
Basos: 1 %
EOS (ABSOLUTE): 0.2 10*3/uL (ref 0.0–0.4)
Eos: 3 %
Hematocrit: 40.7 % (ref 34.0–46.6)
Hemoglobin: 14 g/dL (ref 11.1–15.9)
Immature Grans (Abs): 0 10*3/uL (ref 0.0–0.1)
Immature Granulocytes: 0 %
Lymphocytes Absolute: 2.4 10*3/uL (ref 0.7–3.1)
Lymphs: 41 %
MCH: 31.2 pg (ref 26.6–33.0)
MCHC: 34.4 g/dL (ref 31.5–35.7)
MCV: 91 fL (ref 79–97)
Monocytes Absolute: 0.4 10*3/uL (ref 0.1–0.9)
Monocytes: 7 %
Neutrophils Absolute: 2.9 10*3/uL (ref 1.4–7.0)
Neutrophils: 48 %
Platelets: 358 10*3/uL (ref 150–450)
RBC: 4.49 x10E6/uL (ref 3.77–5.28)
RDW: 13.9 % (ref 11.7–15.4)
WBC: 5.9 10*3/uL (ref 3.4–10.8)

## 2018-11-18 LAB — TSH: TSH: 3.71 u[IU]/mL (ref 0.450–4.500)

## 2018-12-29 ENCOUNTER — Telehealth: Payer: Self-pay

## 2018-12-29 NOTE — Telephone Encounter (Signed)
Patient called and was informed of labs from 11/17/2018. She will try to drink more fluids and avoid Nsaids.  Benedict Needy, CMA

## 2019-01-25 ENCOUNTER — Other Ambulatory Visit: Payer: Self-pay

## 2019-01-25 DIAGNOSIS — Z20828 Contact with and (suspected) exposure to other viral communicable diseases: Secondary | ICD-10-CM | POA: Diagnosis not present

## 2019-01-25 DIAGNOSIS — Z20822 Contact with and (suspected) exposure to covid-19: Secondary | ICD-10-CM

## 2019-01-27 LAB — NOVEL CORONAVIRUS, NAA: SARS-CoV-2, NAA: NOT DETECTED

## 2019-02-08 ENCOUNTER — Ambulatory Visit: Payer: Self-pay

## 2019-02-22 ENCOUNTER — Ambulatory Visit
Admission: EM | Admit: 2019-02-22 | Discharge: 2019-02-22 | Disposition: A | Discharge status: Home / Self Care | Payer: Medicare Other | Attending: Internal Medicine | Admitting: Internal Medicine

## 2019-02-22 ENCOUNTER — Other Ambulatory Visit: Payer: Self-pay

## 2019-02-22 ENCOUNTER — Encounter: Payer: Self-pay | Admitting: Emergency Medicine

## 2019-02-22 DIAGNOSIS — H6591 Unspecified nonsuppurative otitis media, right ear: Secondary | ICD-10-CM

## 2019-02-22 MED ORDER — VALACYCLOVIR HCL 1 G PO TABS: 1000.0000 mg | ORAL_TABLET | Freq: Every day | ORAL | 1 refills | Status: DC

## 2019-02-22 MED ORDER — GUAIFENESIN ER 600 MG PO TB12: 600.0000 mg | ORAL_TABLET | Freq: Two times a day (BID) | ORAL | 0 refills | Status: AC

## 2019-02-22 MED ORDER — DOXYCYCLINE HYCLATE 100 MG PO CAPS: 100.0000 mg | ORAL_CAPSULE | Freq: Two times a day (BID) | ORAL | 0 refills | Status: DC

## 2019-02-22 NOTE — ED Provider Notes (Signed)
[facility]    CSN: [id number] Arrival date & time: 02/22/19  1255      History   Chief Complaint Chief Complaint  Patient presents with  . Back Pain    HPI KALISTA LAGUARDIA is a 69 y.o. female comes to urgent care with 2-week history of general malaise, nonproductive cough, nausea and vomiting.  Patient tested negative for Covid at the outset of her symptoms.  Her symptoms have progressed and she currently has back pain which is currently severe at its peak.  It is 7 out of 10 at its peak.  Aggravated by movement.  No known relieving factors.  Patient denies any fever or chills.  No sick contacts.  No dizziness, near syncope or syncopal episodes.  Patient has a history of scoliosis.  No history of falls.  HPI  Past Medical History:  Diagnosis Date  . Allergy   . Anxiety   . Depression   . History of shingles    ON AND OFF 6 YEARS, recurrent episodes of internal and external shingles with resulting neuralgia  . Neuromuscular disorder (HCC)    POSTHERPETIC NEURALGIA, left wrist weakness  . Pneumonia    HX   SEVERAL TIMES  NONE IN 3 YRS  . Sinusitis 10/18/2016    Patient Active Problem List   Diagnosis Date Noted  . Personal history of colonic polyps   . Anemia 02/17/2017  . Insomnia 11/05/2016  . Osteoarthritis of left hip 10/10/2015  . Status post left hip replacement 10/10/2015  . Onychomycosis of left great toe 05/10/2015  . Prediabetes 01/03/2015  . Anxiety 09/01/2014  . Diverticulitis 09/01/2014  . Vitamin D deficiency 09/01/2014  . Herpes zoster 09/01/2014  . FH: diabetes mellitus 09/01/2014  . FHx: heart disease 09/01/2014  . FH: lymphoma 09/01/2014  . Fibromyalgia 11/15/2013  . Recurrent major depressive disorder, in partial remission ([place]) 11/15/2013  . Vitamin B12 deficiency 12/23/2011  . Allergic rhinitis 04/18/2011    Past Surgical History:  Procedure Laterality Date  . BLADDER REPAIR    . COLON SURGERY  2011   10 inches removed for  benign mass, does not absorb nutrients as well  . COLONOSCOPY  2011   polyp removed- Dr Gustavo Lah  . COLONOSCOPY WITH PROPOFOL N/A 07/04/2017   Procedure: COLONOSCOPY WITH PROPOFOL;  Surgeon: Lucilla Lame, MD;  Location: [place];  Service: Endoscopy;  Laterality: N/A;  recurrent shingles  . NASAL SINUS SURGERY    . TONSILLECTOMY    . TOTAL HIP ARTHROPLASTY Left 10/10/2015   Procedure: LEFT TOTAL HIP ARTHROPLASTY ANTERIOR APPROACH;  Surgeon: Mcarthur Rossetti, MD;  Location: [place];  Service: Orthopedics;  Laterality: Left;  . TRIGGER FINGER RELEASE Left 03/28/2017   Procedure: MIDDLE FINGER DUPREYTRENS RELEASE;  Surgeon: Leanor Kail, MD;  Location: [place];  Service: Orthopedics;  Laterality: Left;  . TUBAL LIGATION    . VAGINAL HYSTERECTOMY      OB History    Gravida  4   Para      Term      Preterm      AB  1   Living        SAB  1   TAB      Ectopic      Multiple      Live Births               Home Medications    Prior to Admission medications   Medication Sig Start Date  End Date Taking? Authorizing Provider  Cholecalciferol (VITAMIN D3) 5000 UNITS CAPS Take 1 capsule (5,000 Units total) by mouth daily. 09/01/14  Yes Plonk, Chrissie Noa, MD  Cyanocobalamin (VITAMIN B-12) 5000 MCG SUBL Place 15,000 mcg under the tongue daily.   Yes [provider]  DULoxetine (CYMBALTA) 60 MG capsule Take 2 capsules (120 mg total) by mouth daily. 10/24/17 02/22/19 Yes Reubin Milan, MD  fluticasone (FLONASE) 50 MCG/ACT nasal spray Place 2 sprays into both nostrils daily. 09/17/16  Yes Lutricia Feil, PA-C  Melatonin 1 MG TABS Take by mouth.   Yes [provider]  Multiple Vitamins-Minerals (MULTIVITAMIN WOMEN 50+) TABS Take 1 tablet by mouth daily.   Yes [provider]  sodium chloride (OCEAN) 0.65 % SOLN nasal spray Place 1 spray into both nostrils as needed for congestion.   Yes [provider]  traZODone  (DESYREL) 100 MG tablet TAKE 1 TABLET(100 MG) BY MOUTH AT BEDTIME AS NEEDED FOR SLEEP 09/29/18  Yes Reubin Milan, MD  doxycycline (VIBRAMYCIN) 100 MG capsule Take 1 capsule (100 mg total) by mouth 2 (two) times daily. 02/22/19   Shammond Arave, Britta Mccreedy, MD  guaiFENesin (MUCINEX) 600 MG 12 hr tablet Take 1 tablet (600 mg total) by mouth 2 (two) times daily for 10 days. 02/22/19 03/04/19  Merrilee Jansky, MD  valACYclovir (VALTREX) 1000 MG tablet Take 1 tablet (1,000 mg total) by mouth daily. 02/22/19   LampteyBritta Mccreedy, MD    Family History Family History  Problem Relation Age of Onset  . Lymphoma Mother   . CAD Father   . Breast cancer Sister 60  . Brain cancer Sister     Social History Social History   Tobacco Use  . Smoking status: Never Smoker  . Smokeless tobacco: Never Used  Substance Use Topics  . Alcohol use: No    Alcohol/week: 0.0 standard drinks  . Drug use: No     Allergies   Amoxicillin-pot clavulanate, Beef-derived products, Pegademase bovine, and Penicillins   Review of Systems Review of Systems  Constitutional: Positive for activity change and fatigue. Negative for chills and fever.  HENT: Negative.  Negative for congestion, ear discharge, ear pain, postnasal drip, sinus pressure, sinus pain and sore throat.   Respiratory: Positive for cough. Negative for chest tightness, shortness of breath and wheezing.   Gastrointestinal: Negative for abdominal pain, diarrhea and nausea.  Genitourinary: Negative.   Musculoskeletal: Positive for myalgias. Negative for arthralgias.  Skin: Negative.   Neurological: Negative for dizziness, weakness and headaches.  Psychiatric/Behavioral: Negative for confusion and decreased concentration.     Physical Exam Triage Vital Signs ED Triage Vitals  Enc Vitals Group     BP 02/22/19 1328 117/81     Pulse Rate 02/22/19 1328 70     Resp 02/22/19 1328 20     Temp 02/22/19 1328 98.3 F (36.8 C)     Temp Source 02/22/19 1328 Oral      SpO2 02/22/19 1328 97 %     Weight 02/22/19 1324 247 lb (112 kg)     Height 02/22/19 1324 5\' 8"  (1.727 m)     Head Circumference --      Peak Flow --      Pain Score 02/22/19 1323 7     Pain Loc --      Pain Edu? --      Excl. in GC? --    No data found.  Updated Vital Signs BP 117/81 (BP Location: Left  Arm)   Pulse 70   Temp 98.3 F (36.8 C) (Oral)   Resp 20   Ht 5\' 8"  (1.727 m)   Wt 112 kg   SpO2 97%   BMI 37.56 kg/m   Visual Acuity Right Eye Distance:   Left Eye Distance:   Bilateral Distance:    Right Eye Near:   Left Eye Near:    Bilateral Near:     Physical Exam Constitutional:      General: She is not in acute distress.    Appearance: She is not ill-appearing or toxic-appearing.  HENT:     Left Ear: Tympanic membrane normal.     Ears:     Comments: Right tympanic membrane is erythematous.  Middle ear effusion on the right.    Nose: No rhinorrhea.     Mouth/Throat:     Pharynx: No oropharyngeal exudate or posterior oropharyngeal erythema.  Cardiovascular:     Rate and Rhythm: Normal rate and regular rhythm.     Pulses: Normal pulses.     Heart sounds: Normal heart sounds. No murmur. No friction rub.  Pulmonary:     Effort: Pulmonary effort is normal. No respiratory distress.     Breath sounds: Normal breath sounds. No stridor. No wheezing or rhonchi.  Abdominal:     General: Bowel sounds are normal. There is no distension.     Palpations: Abdomen is soft.     Tenderness: There is no abdominal tenderness. There is no rebound.  Musculoskeletal:        General: No tenderness, deformity or signs of injury. Normal range of motion.  Skin:    General: Skin is warm and dry.     Capillary Refill: Capillary refill takes less than 2 seconds.     Coloration: Skin is not jaundiced.     Findings: No erythema or lesion.  Neurological:     General: No focal deficit present.     Mental Status: She is alert and oriented to person, place, and time.      UC  Treatments / Results  Labs (all labs ordered are listed, but only abnormal results are displayed) Labs Reviewed - No data to display  EKG   Radiology No results found.  Procedures Procedures (including critical care time)  Medications Ordered in UC Medications - No data to display  Initial Impression / Assessment and Plan / UC Course  I have reviewed the triage vital signs and the nursing notes.  Pertinent labs & imaging results that were available during my care of the patient were reviewed by me and considered in my medical decision making (see chart for details).     1.  Right otitis media with middle ear effusion: Doxycycline 100 mg twice daily for 10 days Mucinex 600 mg twice daily If patient symptoms worsens she is advised to come to the urgent care to be reevaluated Final Clinical Impressions(s) / UC Diagnoses   Final diagnoses:  Right non-suppurative otitis media   Discharge Instructions   None    ED Prescriptions    Medication Sig Dispense Auth. Provider   valACYclovir (VALTREX) 1000 MG tablet Take 1 tablet (1,000 mg total) by mouth daily. 10 tablet Jameca Chumley, , MD   guaiFENesin (MUCINEX) 600 MG 12 hr tablet Take 1 tablet (600 mg total) by mouth 2 (two) times daily for 10 days. 20 tablet Orvile Corona, Britta Mccreedy, MD   doxycycline (VIBRAMYCIN) 100 MG capsule Take 1 capsule (100 mg total) by mouth 2 (two)  times daily. 20 capsule Quiana Cobaugh, Britta Mccreedy, MD     PDMP not reviewed this encounter.   Merrilee Jansky, MD 02/22/19 1919

## 2019-02-22 NOTE — ED Triage Notes (Signed)
Pt c/o back pain. She states that it is "lung pain". Started about 2 weeks ago. She states that she started out with a virus with nausea, vomiting and cough about 3 weeks ago. She was tested for covid and was negative. She states that she has just not gotten better since then. She states that the "lung pain" is worse when she takes a deep breath.

## 2019-03-08 ENCOUNTER — Ambulatory Visit (INDEPENDENT_AMBULATORY_CARE_PROVIDER_SITE_OTHER): Payer: Medicare Other

## 2019-03-08 DIAGNOSIS — Z1231 Encounter for screening mammogram for malignant neoplasm of breast: Secondary | ICD-10-CM | POA: Diagnosis not present

## 2019-03-08 DIAGNOSIS — Z780 Asymptomatic menopausal state: Secondary | ICD-10-CM

## 2019-03-08 DIAGNOSIS — Z0000 Encounter for general adult medical examination without abnormal findings: Secondary | ICD-10-CM

## 2019-03-08 NOTE — Progress Notes (Signed)
Subjective:   Ann Peters is a 69 y.o. female who presents for Medicare Annual (Subsequent) preventive examination.  Virtual Visit via Telephone Note  I connected with Theophilus Kinds on 03/08/19 at  2:40 PM EST by telephone and verified that I am speaking with the correct person using two identifiers.  Medicare Annual Wellness visit completed telephonically due to Covid-19 pandemic.   Location: Patient: home/car Provider: office   I discussed the limitations, risks, security and privacy concerns of performing an evaluation and management service by telephone and the availability of in person appointments. The patient expressed understanding and agreed to proceed.  Some vital signs may be absent or patient reported.   Reather Littler, LPN    Review of Systems:   Cardiac Risk Factors include: advanced age (>64men, >65 women)     Objective:     Vitals: There were no vitals taken for this visit.  There is no height or weight on file to calculate BMI.  Advanced Directives 03/08/2019 02/22/2019 02/04/2018 07/04/2017 03/28/2017 01/20/2017 11/05/2016  Does Patient Have a Medical Advance Directive? No No No No No No No  Would patient like information on creating a medical advance directive? Yes (MAU/Ambulatory/Procedural Areas - Information given) - Yes (MAU/Ambulatory/Procedural Areas - Information given) No - Patient declined Yes (MAU/Ambulatory/Procedural Areas - Information given) Yes (MAU/Ambulatory/Procedural Areas - Information given) -    Tobacco Social History   Tobacco Use  Smoking Status Never Smoker  Smokeless Tobacco Never Used     Counseling given: Not Answered   Clinical Intake:  Pre-visit preparation completed: Yes  Pain : No/denies pain     Diabetes: No  How often do you need to have someone help you when you read instructions, pamphlets, or other written materials from your doctor or pharmacy?: 1 - Never  Interpreter Needed?: No  Information entered by ::  Reather Littler LPN  Past Medical History:  Diagnosis Date  . Allergy   . Anxiety   . Depression   . History of shingles    ON AND OFF 6 YEARS, recurrent episodes of internal and external shingles with resulting neuralgia  . Neuromuscular disorder (HCC)    POSTHERPETIC NEURALGIA, left wrist weakness  . Pneumonia    HX   SEVERAL TIMES  NONE IN 3 YRS  . Sinusitis 10/18/2016   Past Surgical History:  Procedure Laterality Date  . BLADDER REPAIR    . COLON SURGERY  2011   10 inches removed for benign mass, does not absorb nutrients as well  . COLONOSCOPY  2011   polyp removed- Dr Marva Panda  . COLONOSCOPY WITH PROPOFOL N/A 07/04/2017   Procedure: COLONOSCOPY WITH PROPOFOL;  Surgeon: Midge Minium, MD;  Location: [facility];  Service: Endoscopy;  Laterality: N/A;  recurrent shingles  . NASAL SINUS SURGERY    . TONSILLECTOMY    . TOTAL HIP ARTHROPLASTY Left 10/10/2015   Procedure: LEFT TOTAL HIP ARTHROPLASTY ANTERIOR APPROACH;  Surgeon: Kathryne Hitch, MD;  Location: [facility] OR;  Service: Orthopedics;  Laterality: Left;  . TRIGGER FINGER RELEASE Left 03/28/2017   Procedure: MIDDLE FINGER DUPREYTRENS RELEASE;  Surgeon: Erin Sons, MD;  Location: [facility];  Service: Orthopedics;  Laterality: Left;  . TUBAL LIGATION    . VAGINAL HYSTERECTOMY     Family History  Problem Relation Age of Onset  . Lymphoma Mother   . CAD Father   . Breast cancer Sister 63  . Brain cancer Sister    Social History  Socioeconomic History  . Marital status: Married    Spouse name: Not on file  . Number of children: 3  . Years of education: Not on file  . Highest education level: High school graduate  Occupational History  . Occupation: Retired  Tobacco Use  . Smoking status: Never Smoker  . Smokeless tobacco: Never Used  Substance and Sexual Activity  . Alcohol use: No    Alcohol/week: 0.0 standard drinks  . Drug use: No  . Sexual activity: Yes  Other Topics Concern  . Not  on file  Social History Narrative  . Not on file   Social Determinants of Health   Financial Resource Strain: Low Risk   . Difficulty of Paying Living Expenses: Not hard at all  Food Insecurity: No Food Insecurity  . Worried About Charity fundraiser in the Last Year: Never true  . Ran Out of Food in the Last Year: Never true  Transportation Needs: No Transportation Needs  . Lack of Transportation (Medical): No  . Lack of Transportation (Non-Medical): No  Physical Activity: Inactive  . Days of Exercise per Week: 0 days  . Minutes of Exercise per Session: 0 min  Stress: No Stress Concern Present  . Feeling of Stress : Not at all  Social Connections: Somewhat Isolated  . Frequency of Communication with Friends and Family: More than three times a week  . Frequency of Social Gatherings with Friends and Family: Once a week  . Attends Religious Services: Never  . Active Member of Clubs or Organizations: No  . Attends Archivist Meetings: Never  . Marital Status: Married    Outpatient Encounter Medications as of 03/08/2019  Medication Sig  . Cholecalciferol (VITAMIN D3) 5000 UNITS CAPS Take 1 capsule (5,000 Units total) by mouth daily.  . Cyanocobalamin (VITAMIN B-12) 5000 MCG SUBL Place 15,000 mcg under the tongue daily.  . fluticasone (FLONASE) 50 MCG/ACT nasal spray Place 2 sprays into both nostrils daily.  . Melatonin 1 MG TABS Take by mouth.  . Multiple Vitamins-Minerals (MULTIVITAMIN WOMEN 50+) TABS Take 1 tablet by mouth daily.  . sodium chloride (OCEAN) 0.65 % SOLN nasal spray Place 1 spray into both nostrils as needed for congestion.  . traZODone (DESYREL) 100 MG tablet TAKE 1 TABLET(100 MG) BY MOUTH AT BEDTIME AS NEEDED FOR SLEEP  . valACYclovir (VALTREX) 1000 MG tablet Take 1 tablet (1,000 mg total) by mouth daily.  . DULoxetine (CYMBALTA) 60 MG capsule Take 2 capsules (120 mg total) by mouth daily.  . [DISCONTINUED] doxycycline (VIBRAMYCIN) 100 MG capsule Take 1  capsule (100 mg total) by mouth 2 (two) times daily.   No facility-administered encounter medications on file as of 03/08/2019.    Activities of Daily Living In your present state of health, do you have any difficulty performing the following activities: 03/08/2019  Hearing? N  Comment declines hearing aids  Vision? N  Difficulty concentrating or making decisions? N  Walking or climbing stairs? N  Dressing or bathing? N  Doing errands, shopping? N  Preparing Food and eating ? N  Using the Toilet? N  In the past six months, have you accidently leaked urine? N  Do you have problems with loss of bowel control? N  Managing your Medications? N  Managing your Finances? N  Housekeeping or managing your Housekeeping? N  Some recent data might be hidden    Patient Care Team: Glean Hess, MD as PCP - General (Internal Medicine) Margaretha Sheffield, MD  as Consulting Physician (Otolaryngology) Doloris Hall (Psychiatry)    Assessment:   This is a routine wellness examination for Caili.  Exercise Activities and Dietary recommendations Current Exercise Habits: The patient does not participate in regular exercise at present, Exercise limited by: None identified  Goals    . Exercise 150 min/wk Moderate Activity     Recommend to exercise 3-5 times per week, approx 150 minutes per week       Fall Risk Fall Risk  03/08/2019 07/06/2018 03/16/2018 02/04/2018 01/20/2017  Falls in the past year? 0 0 0 0 No  Number falls in past yr: 0 0 0 0 -  Comment - - - - -  Injury with Fall? 0 0 0 - -  Risk for fall due to : No Fall Risks Impaired balance/gait;History of fall(s) - - -  Follow up Falls prevention discussed Falls evaluation completed Falls evaluation completed - -   FALL RISK PREVENTION PERTAINING TO THE HOME:  Any stairs in or around the home? Yes  If so, do they handrails? Yes   Home free of loose throw rugs in walkways, pet beds, electrical cords, etc? Yes  Adequate lighting in your  home to reduce risk of falls? Yes   ASSISTIVE DEVICES UTILIZED TO PREVENT FALLS:  Life alert? No  Use of a cane, walker or w/c? No  Grab bars in the bathroom? No  Shower chair or bench in shower? No  Elevated toilet seat or a handicapped toilet? No   DME ORDERS:  DME order needed?  No   TIMED UP AND GO:  Was the test performed? No . Telephonic visit.   Education: Fall risk prevention has been discussed.  Intervention(s) required? No    Depression Screen PHQ 2/9 Scores 03/08/2019 11/17/2018 07/06/2018 03/16/2018  PHQ - 2 Score 0 0 0 0  PHQ- 9 Score - 0 - 0     Cognitive Function - 6CIT deferred for 2021 AWV; pt has no memory issues.      6CIT Screen 02/04/2018 01/20/2017  What Year? 0 points 0 points  What month? 0 points 0 points  What time? 0 points 0 points  Count back from 20 0 points 0 points  Months in reverse 0 points 0 points  Repeat phrase 0 points 4 points  Total Score 0 4    Immunization History  Administered Date(s) Administered  . Fluad Quad(high Dose 65+) 11/17/2018  . Influenza, High Dose Seasonal PF 11/05/2016, 02/04/2018  . Influenza,inj,Quad PF,6+ Mos 01/03/2015, 01/22/2016  . Pneumococcal Conjugate-13 10/03/2014  . Pneumococcal Polysaccharide-23 05/08/2015  . Tdap 10/03/2014  . Zoster 05/30/2011    Qualifies for Shingles Vaccine? Yes  Zostavax completed 2013. Due for Shingrix. Education has been provided regarding the importance of this vaccine. Pt has been advised to call insurance company to determine out of pocket expense. Advised may also receive vaccine at local pharmacy or Health Dept. Verbalized acceptance and understanding.  Tdap: Up to date  Flu Vaccine: Up to date  Pneumococcal Vaccine: Up to date   Screening Tests Health Maintenance  Topic Date Due  . MAMMOGRAM  02/20/2018  . COLONOSCOPY  07/05/2022  . TETANUS/TDAP  10/02/2024  . INFLUENZA VACCINE  Completed  . DEXA SCAN  Completed  . Hepatitis C Screening  Completed  . PNA  vac Low Risk Adult  Completed    Cancer Screenings:  Colorectal Screening: Completed 07/04/17. Repeat every 5 years;  Mammogram: Completed 02/20/17. Repeat every year. Ordered today. Pt provided with  contact information and advised to call to schedule appt.   Bone Density: Completed 02/20/17. Results reflect NORMAL. Repeat every 2 years. Ordered today. Pt provided with contact information and advised to call to schedule appt.   Lung Cancer Screening: (Low Dose CT Chest recommended if Age 64-80 years, 30 pack-year currently smoking OR have quit w/in 15years.) does not qualify.   Additional Screening:  Hepatitis C Screening: does qualify; Completed 01/20/17  Vision Screening: Recommended annual ophthalmology exams for early detection of glaucoma and other disorders of the eye. Is the patient up to date with their annual eye exam?  No  Who is the provider or what is the name of the office in which the pt attends annual eye exams? [facility]  Dental Screening: Recommended annual dental exams for proper oral hygiene  Community Resource Referral:  CRR required this visit?  No      Plan:      I have personally reviewed and addressed the Medicare Annual Wellness questionnaire and have noted the following in the patient's chart:  A. Medical and social history B. Use of alcohol, tobacco or illicit drugs  C. Current medications and supplements D. Functional ability and status E.  Nutritional status F.  Physical activity G. Advance directives H. List of other physicians I.  Hospitalizations, surgeries, and ER visits in previous 12 months J.  Vitals K. Screenings such as hearing and vision if needed, cognitive and depression L. Referrals and appointments   In addition, I have reviewed and discussed with patient certain preventive protocols, quality metrics, and best practice recommendations. A written personalized care plan for preventive services as well as general preventive  health recommendations were provided to patient.   Signed,  Reather Littler, LPN Nurse Health Advisor   Nurse Notes: pt states she is unsure if she should get the shingles vaccine due to her history with shingles and also has same concerns about Covid-19 vaccine. Pt plans to send Dr. Judithann Graves a message in MyChart for clarification. Thank you.

## 2019-03-08 NOTE — Patient Instructions (Signed)
Ann Peters , Thank you for taking time to come for your Medicare Wellness Visit. I appreciate your ongoing commitment to your health goals. Please review the following plan we discussed and let me know if I can assist you in the future.   Screening recommendations/referrals: Colonoscopy: done 07/04/17. Repeat in 2024. Mammogram: done 02/20/17. Please call [phone] to schedule your mammogram and bone density screening.  Bone Density: done 02/20/17 Recommended yearly ophthalmology/optometry visit for glaucoma screening and checkup Recommended yearly dental visit for hygiene and checkup  Vaccinations: Influenza vaccine: done 11/17/18 Pneumococcal vaccine: done 05/08/15 Tdap vaccine: done 10/03/14 Shingles vaccine: Shingrix discussed. Please contact your pharmacy for coverage information.   Advanced directives: Advance directive discussed with you today. I have provided a copy for you to complete at home and have notarized. Once this is complete please bring a copy in to our office so we can scan it into your chart.  Conditions/risks identified: Recommend increasing physical activity.   Next appointment: Please follow up in one year for your Medicare Annual Wellness visit.     Preventive Care 13 Years and Older, Female Preventive care refers to lifestyle choices and visits with your health care provider that can promote health and wellness. What does preventive care include?  A yearly physical exam. This is also called an annual well check.  Dental exams once or twice a year.  Routine eye exams. Ask your health care provider how often you should have your eyes checked.  Personal lifestyle choices, including:  Daily care of your teeth and gums.  Regular physical activity.  Eating a healthy diet.  Avoiding tobacco and drug use.  Limiting alcohol use.  Practicing safe sex.  Taking low-dose aspirin every day.  Taking vitamin and mineral supplements as recommended by your health  care provider. What happens during an annual well check? The services and screenings done by your health care provider during your annual well check will depend on your age, overall health, lifestyle risk factors, and family history of disease. Counseling  Your health care provider may ask you questions about your:  Alcohol use.  Tobacco use.  Drug use.  Emotional well-being.  Home and relationship well-being.  Sexual activity.  Eating habits.  History of falls.  Memory and ability to understand (cognition).  Work and work Astronomer.  Reproductive health. Screening  You may have the following tests or measurements:  Height, weight, and BMI.  Blood pressure.  Lipid and cholesterol levels. These may be checked every 5 years, or more frequently if you are over 3 years old.  Skin check.  Lung cancer screening. You may have this screening every year starting at age 21 if you have a 30-pack-year history of smoking and currently smoke or have quit within the past 15 years.  Fecal occult blood test (FOBT) of the stool. You may have this test every year starting at age 9.  Flexible sigmoidoscopy or colonoscopy. You may have a sigmoidoscopy every 5 years or a colonoscopy every 10 years starting at age 32.  Hepatitis C blood test.  Hepatitis B blood test.  Sexually transmitted disease (STD) testing.  Diabetes screening. This is done by checking your blood sugar (glucose) after you have not eaten for a while (fasting). You may have this done every 1-3 years.  Bone density scan. This is done to screen for osteoporosis. You may have this done starting at age 31.  Mammogram. This may be done every 1-2 years. Talk to your health care  provider about how often you should have regular mammograms. Talk with your health care provider about your test results, treatment options, and if necessary, the need for more tests. Vaccines  Your health care provider may recommend certain  vaccines, such as:  Influenza vaccine. This is recommended every year.  Tetanus, diphtheria, and acellular pertussis (Tdap, Td) vaccine. You may need a Td booster every 10 years.  Zoster vaccine. You may need this after age 48.  Pneumococcal 13-valent conjugate (PCV13) vaccine. One dose is recommended after age 55.  Pneumococcal polysaccharide (PPSV23) vaccine. One dose is recommended after age 84. Talk to your health care provider about which screenings and vaccines you need and how often you need them. This information is not intended to replace advice given to you by your health care provider. Make sure you discuss any questions you have with your health care provider. Document Released: 03/03/2015 Document Revised: 10/25/2015 Document Reviewed: 12/06/2014 Elsevier Interactive Patient Education  2017 [place] Prevention in the Home Falls can cause injuries. They can happen to people of all ages. There are many things you can do to make your home safe and to help prevent falls. What can I do on the outside of my home?  Regularly fix the edges of walkways and driveways and fix any cracks.  Remove anything that might make you trip as you walk through a door, such as a raised step or threshold.  Trim any bushes or trees on the path to your home.  Use bright outdoor lighting.  Clear any walking paths of anything that might make someone trip, such as rocks or tools.  Regularly check to see if handrails are loose or broken. Make sure that both sides of any steps have handrails.  Any raised decks and porches should have guardrails on the edges.  Have any leaves, snow, or ice cleared regularly.  Use sand or salt on walking paths during winter.  Clean up any spills in your garage right away. This includes oil or grease spills. What can I do in the bathroom?  Use night lights.  Install grab bars by the toilet and in the tub and shower. Do not use towel bars as grab  bars.  Use non-skid mats or decals in the tub or shower.  If you need to sit down in the shower, use a plastic, non-slip stool.  Keep the floor dry. Clean up any water that spills on the floor as soon as it happens.  Remove soap buildup in the tub or shower regularly.  Attach bath mats securely with double-sided non-slip rug tape.  Do not have throw rugs and other things on the floor that can make you trip. What can I do in the bedroom?  Use night lights.  Make sure that you have a light by your bed that is easy to reach.  Do not use any sheets or blankets that are too big for your bed. They should not hang down onto the floor.  Have a firm chair that has side arms. You can use this for support while you get dressed.  Do not have throw rugs and other things on the floor that can make you trip. What can I do in the kitchen?  Clean up any spills right away.  Avoid walking on wet floors.  Keep items that you use a lot in easy-to-reach places.  If you need to reach something above you, use a strong step stool that has a grab bar.  Keep electrical cords out of the way.  Do not use floor polish or wax that makes floors slippery. If you must use wax, use non-skid floor wax.  Do not have throw rugs and other things on the floor that can make you trip. What can I do with my stairs?  Do not leave any items on the stairs.  Make sure that there are handrails on both sides of the stairs and use them. Fix handrails that are broken or loose. Make sure that handrails are as long as the stairways.  Check any carpeting to make sure that it is firmly attached to the stairs. Fix any carpet that is loose or worn.  Avoid having throw rugs at the top or bottom of the stairs. If you do have throw rugs, attach them to the floor with carpet tape.  Make sure that you have a light switch at the top of the stairs and the bottom of the stairs. If you do not have them, ask someone to add them for  you. What else can I do to help prevent falls?  Wear shoes that:  Do not have high heels.  Have rubber bottoms.  Are comfortable and fit you well.  Are closed at the toe. Do not wear sandals.  If you use a stepladder:  Make sure that it is fully opened. Do not climb a closed stepladder.  Make sure that both sides of the stepladder are locked into place.  Ask someone to hold it for you, if possible.  Clearly mark and make sure that you can see:  Any grab bars or handrails.  First and last steps.  Where the edge of each step is.  Use tools that help you move around (mobility aids) if they are needed. These include:  Canes.  Walkers.  Scooters.  Crutches.  Turn on the lights when you go into a dark area. Replace any light bulbs as soon as they burn out.  Set up your furniture so you have a clear path. Avoid moving your furniture around.  If any of your floors are uneven, fix them.  If there are any pets around you, be aware of where they are.  Review your medicines with your doctor. Some medicines can make you feel dizzy. This can increase your chance of falling. Ask your doctor what other things that you can do to help prevent falls. This information is not intended to replace advice given to you by your health care provider. Make sure you discuss any questions you have with your health care provider. Document Released: 12/01/2008 Document Revised: 07/13/2015 Document Reviewed: 03/11/2014 Elsevier Interactive Patient Education  2017 [employer].

## 2019-03-30 ENCOUNTER — Ambulatory Visit
Admission: RE | Admit: 2019-03-30 | Discharge: 2019-03-30 | Disposition: A | Discharge status: Home / Self Care | Payer: Medicare Other | Source: Ambulatory Visit | Attending: Internal Medicine | Admitting: Internal Medicine

## 2019-03-30 ENCOUNTER — Other Ambulatory Visit: Payer: Self-pay

## 2019-03-30 DIAGNOSIS — Z1231 Encounter for screening mammogram for malignant neoplasm of breast: Secondary | ICD-10-CM | POA: Insufficient documentation

## 2019-03-30 DIAGNOSIS — Z780 Asymptomatic menopausal state: Secondary | ICD-10-CM | POA: Insufficient documentation

## 2019-04-01 ENCOUNTER — Other Ambulatory Visit: Payer: Self-pay | Admitting: Internal Medicine

## 2019-04-01 DIAGNOSIS — G4700 Insomnia, unspecified: Secondary | ICD-10-CM

## 2019-04-06 DIAGNOSIS — J3489 Other specified disorders of nose and nasal sinuses: Secondary | ICD-10-CM | POA: Diagnosis not present

## 2019-04-06 DIAGNOSIS — J343 Hypertrophy of nasal turbinates: Secondary | ICD-10-CM | POA: Diagnosis not present

## 2019-04-26 ENCOUNTER — Encounter: Payer: Self-pay | Admitting: Otolaryngology

## 2019-04-26 ENCOUNTER — Other Ambulatory Visit: Payer: Self-pay

## 2019-04-27 ENCOUNTER — Other Ambulatory Visit
Admission: RE | Admit: 2019-04-27 | Discharge: 2019-04-27 | Disposition: A | Discharge status: Home / Self Care | Payer: Medicare Other | Source: Ambulatory Visit | Attending: Otolaryngology | Admitting: Otolaryngology

## 2019-04-27 DIAGNOSIS — Z20822 Contact with and (suspected) exposure to covid-19: Secondary | ICD-10-CM | POA: Diagnosis not present

## 2019-04-27 DIAGNOSIS — Z01812 Encounter for preprocedural laboratory examination: Secondary | ICD-10-CM | POA: Diagnosis not present

## 2019-04-27 LAB — SARS CORONAVIRUS 2 (TAT 6-24 HRS): SARS Coronavirus 2: NEGATIVE

## 2019-04-29 ENCOUNTER — Ambulatory Visit: Payer: Medicare Other | Admitting: Anesthesiology

## 2019-04-29 ENCOUNTER — Other Ambulatory Visit: Payer: Self-pay

## 2019-04-29 ENCOUNTER — Ambulatory Visit
Admission: RE | Admit: 2019-04-29 | Discharge: 2019-04-29 | Disposition: A | Discharge status: Home / Self Care | Payer: Medicare Other | Attending: Otolaryngology | Admitting: Otolaryngology

## 2019-04-29 ENCOUNTER — Encounter: Payer: Self-pay | Admitting: Otolaryngology

## 2019-04-29 ENCOUNTER — Encounter
Admission: RE | Disposition: A | Discharge status: Home / Self Care | Payer: Self-pay | Source: Home / Self Care | Attending: Otolaryngology

## 2019-04-29 DIAGNOSIS — Z6837 Body mass index (BMI) 37.0-37.9, adult: Secondary | ICD-10-CM | POA: Diagnosis not present

## 2019-04-29 DIAGNOSIS — M797 Fibromyalgia: Secondary | ICD-10-CM | POA: Diagnosis not present

## 2019-04-29 DIAGNOSIS — J343 Hypertrophy of nasal turbinates: Secondary | ICD-10-CM | POA: Diagnosis not present

## 2019-04-29 DIAGNOSIS — E669 Obesity, unspecified: Secondary | ICD-10-CM | POA: Insufficient documentation

## 2019-04-29 DIAGNOSIS — J988 Other specified respiratory disorders: Secondary | ICD-10-CM | POA: Diagnosis not present

## 2019-04-29 DIAGNOSIS — J3489 Other specified disorders of nose and nasal sinuses: Secondary | ICD-10-CM | POA: Diagnosis not present

## 2019-04-29 DIAGNOSIS — F329 Major depressive disorder, single episode, unspecified: Secondary | ICD-10-CM | POA: Diagnosis not present

## 2019-04-29 DIAGNOSIS — Z79899 Other long term (current) drug therapy: Secondary | ICD-10-CM | POA: Insufficient documentation

## 2019-04-29 DIAGNOSIS — F419 Anxiety disorder, unspecified: Secondary | ICD-10-CM | POA: Insufficient documentation

## 2019-04-29 DIAGNOSIS — J310 Chronic rhinitis: Secondary | ICD-10-CM | POA: Diagnosis not present

## 2019-04-29 HISTORY — DX: Zoster without complications: B02.9

## 2019-04-29 HISTORY — DX: Scoliosis, unspecified: M41.9

## 2019-04-29 HISTORY — DX: Other postherpetic nervous system involvement: B02.29

## 2019-04-29 HISTORY — PX: TURBINATE REDUCTION: SHX6157

## 2019-04-29 SURGERY — REDUCTION, NASAL TURBINATE
Anesthesia: Monitor Anesthesia Care | Site: Nose | Laterality: Bilateral

## 2019-04-29 MED ORDER — OXYMETAZOLINE HCL 0.05 % NA SOLN
2.0000 | Freq: Once | NASAL | Status: AC
  Administered 2019-04-29: 2 via NASAL

## 2019-04-29 MED ORDER — PROPOFOL 10 MG/ML IV BOLUS
INTRAVENOUS | Status: DC | PRN
  Administered 2019-04-29: 50 mg via INTRAVENOUS
  Administered 2019-04-29: 30 mg via INTRAVENOUS
  Administered 2019-04-29: 120 mg via INTRAVENOUS

## 2019-04-29 MED ORDER — FENTANYL CITRATE (PF) 100 MCG/2ML IJ SOLN
INTRAMUSCULAR | Status: DC | PRN
  Administered 2019-04-29: 50 ug via INTRAVENOUS

## 2019-04-29 MED ORDER — GLYCOPYRROLATE 0.2 MG/ML IJ SOLN
INTRAMUSCULAR | Status: DC | PRN
  Administered 2019-04-29: .1 mg via INTRAVENOUS

## 2019-04-29 MED ORDER — EPHEDRINE SULFATE 50 MG/ML IJ SOLN
INTRAMUSCULAR | Status: DC | PRN
  Administered 2019-04-29 (×2): 5 mg via INTRAVENOUS

## 2019-04-29 MED ORDER — PHENYLEPHRINE HCL 0.5 % NA SOLN
NASAL | Status: DC | PRN
  Administered 2019-04-29: 30 mL via TOPICAL

## 2019-04-29 MED ORDER — ONDANSETRON HCL 4 MG/2ML IJ SOLN: 4.0000 mg | Freq: Once | INTRAMUSCULAR | Status: DC | PRN

## 2019-04-29 MED ORDER — ACETAMINOPHEN 10 MG/ML IV SOLN
1000.0000 mg | Freq: Once | INTRAVENOUS | Status: AC
  Administered 2019-04-29: 1000 mg via INTRAVENOUS

## 2019-04-29 MED ORDER — SUCCINYLCHOLINE CHLORIDE 20 MG/ML IJ SOLN
INTRAMUSCULAR | Status: DC | PRN
  Administered 2019-04-29: 80 mg via INTRAVENOUS

## 2019-04-29 MED ORDER — LIDOCAINE HCL (CARDIAC) PF 100 MG/5ML IV SOSY
PREFILLED_SYRINGE | INTRAVENOUS | Status: DC | PRN
  Administered 2019-04-29: 40 mg via INTRAVENOUS

## 2019-04-29 MED ORDER — ONDANSETRON HCL 4 MG/2ML IJ SOLN
INTRAMUSCULAR | Status: DC | PRN
  Administered 2019-04-29: 4 mg via INTRAVENOUS

## 2019-04-29 MED ORDER — HYDROCODONE-ACETAMINOPHEN 5-325 MG PO TABS: 1.0000 | ORAL_TABLET | Freq: Four times a day (QID) | ORAL | 0 refills | Status: AC | PRN

## 2019-04-29 MED ORDER — LACTATED RINGERS IV SOLN
100.0000 mL/h | INTRAVENOUS | Status: DC
  Administered 2019-04-29: 100 mL/h via INTRAVENOUS

## 2019-04-29 MED ORDER — LIDOCAINE-EPINEPHRINE 1 %-1:100000 IJ SOLN
INTRAMUSCULAR | Status: DC | PRN
  Administered 2019-04-29: 1.5 mL

## 2019-04-29 MED ORDER — DEXAMETHASONE SODIUM PHOSPHATE 4 MG/ML IJ SOLN
INTRAMUSCULAR | Status: DC | PRN
  Administered 2019-04-29: 10 mg via INTRAVENOUS

## 2019-04-29 MED ORDER — MIDAZOLAM HCL 5 MG/5ML IJ SOLN
INTRAMUSCULAR | Status: DC | PRN
  Administered 2019-04-29: 2 mg via INTRAVENOUS

## 2019-04-29 SURGICAL SUPPLY — 28 items
CANISTER SUCT 1200ML W/VALVE (MISCELLANEOUS) ×2 IMPLANT
COAGULATOR SUCT 8FR VV (MISCELLANEOUS) ×2 IMPLANT
ELECT REM PT RETURN 9FT ADLT (ELECTROSURGICAL) ×2
ELECTRODE REM PT RTRN 9FT ADLT (ELECTROSURGICAL) ×1 IMPLANT
GLOVE PI ULTRA LF STRL 7.5 (GLOVE) ×3 IMPLANT
GLOVE PI ULTRA NON LATEX 7.5 (GLOVE) ×3
GOWN STRL REUS W/ TWL LRG LVL3 (GOWN DISPOSABLE) ×1 IMPLANT
GOWN STRL REUS W/TWL LRG LVL3 (GOWN DISPOSABLE) ×1
KIT TURNOVER KIT A (KITS) ×2 IMPLANT
NEEDLE ANESTHESIA  27G X 3.5 (NEEDLE)
NEEDLE ANESTHESIA 27G X 3.5 (NEEDLE) IMPLANT
NEEDLE HYPO 27GX1-1/4 (NEEDLE) ×2 IMPLANT
PACK ENT CUSTOM (PACKS) ×2 IMPLANT
PATTIES SURGICAL .5 X3 (DISPOSABLE) ×2 IMPLANT
SOL ANTI-FOG 6CC FOG-OUT (MISCELLANEOUS) ×1 IMPLANT
SOL FOG-OUT ANTI-FOG 6CC (MISCELLANEOUS) ×1
SPLINT NASAL SEPTAL BLV .50 ST (MISCELLANEOUS) IMPLANT
STRAP BODY AND KNEE 60X3 (MISCELLANEOUS) ×2 IMPLANT
STYLUS VIVAER (MISCELLANEOUS) ×1
STYLUS VIVAER BP ELECT (MISCELLANEOUS) ×1 IMPLANT
SUT CHROMIC 3-0 (SUTURE)
SUT CHROMIC 3-0 KS 27XMFL CR (SUTURE)
SUT ETHILON 3-0 KS 30 BLK (SUTURE) IMPLANT
SUT PLAIN GUT 4-0 (SUTURE) IMPLANT
SUTURE CHRMC 3-0 KS 27XMFL CR (SUTURE) IMPLANT
SYR 3ML LL SCALE MARK (SYRINGE) ×2 IMPLANT
TOWEL OR 17X26 4PK STRL BLUE (TOWEL DISPOSABLE) ×2 IMPLANT
WATER STERILE IRR 250ML POUR (IV SOLUTION) ×2 IMPLANT

## 2019-04-29 NOTE — H&P (Signed)
H&P has been reviewed and patient reevaluated, no changes necessary. To be downloaded later.  

## 2019-04-29 NOTE — Anesthesia Procedure Notes (Signed)
Procedure Name: Intubation Date/Time: 04/29/2019 8:24 AM Performed by: Mayme Genta, CRNA Pre-anesthesia Checklist: Patient identified, Emergency Drugs available, Suction available, Patient being monitored and Timeout performed Patient Re-evaluated:Patient Re-evaluated prior to induction Oxygen Delivery Method: Circle system utilized Preoxygenation: Pre-oxygenation with 100% oxygen Induction Type: IV induction Ventilation: Mask ventilation without difficulty Laryngoscope Size: Mac and 3 Grade View: Grade I Tube type: Oral Rae Tube size: 7.0 mm Number of attempts: 1 Placement Confirmation: ETT inserted through vocal cords under direct vision,  positive ETCO2 and breath sounds checked- equal and bilateral Tube secured with: Tape Dental Injury: Teeth and Oropharynx as per pre-operative assessment

## 2019-04-29 NOTE — Discharge Instructions (Signed)
General Anesthesia, Adult, Care After This sheet gives you information about how to care for yourself after your procedure. Your health care provider may also give you more specific instructions. If you have problems or questions, contact your health care provider. What can I expect after the procedure? After the procedure, the following side effects are common:  Pain or discomfort at the IV site.  Nausea.  Vomiting.  Sore throat.  Trouble concentrating.  Feeling cold or chills.  Weak or tired.  Sleepiness and fatigue.  Soreness and body aches. These side effects can affect parts of the body that were not involved in surgery. Follow these instructions at home:  For at least 24 hours after the procedure:  Have a responsible adult stay with you. It is important to have someone help care for you until you are awake and alert.  Rest as needed.  Do not: ? Participate in activities in which you could fall or become injured. ? Drive. ? Use heavy machinery. ? Drink alcohol. ? Take sleeping pills or medicines that cause drowsiness. ? Make important decisions or sign legal documents. ? Take care of children on your own. Eating and drinking  Follow any instructions from your health care provider about eating or drinking restrictions.  When you feel hungry, start by eating small amounts of foods that are soft and easy to digest (bland), such as toast. Gradually return to your regular diet.  Drink enough fluid to keep your urine pale yellow.  If you vomit, rehydrate by drinking water, juice, or clear broth. General instructions  If you have sleep apnea, surgery and certain medicines can increase your risk for breathing problems. Follow instructions from your health care provider about wearing your sleep device: ? Anytime you are sleeping, including during daytime naps. ? While taking prescription pain medicines, sleeping medicines, or medicines that make you drowsy.  Return to  your normal activities as told by your health care provider. Ask your health care provider what activities are safe for you.  Take over-the-counter and prescription medicines only as told by your health care provider.  If you smoke, do not smoke without supervision.  Keep all follow-up visits as told by your health care provider. This is important. Contact a health care provider if:  You have nausea or vomiting that does not get better with medicine.  You cannot eat or drink without vomiting.  You have pain that does not get better with medicine.  You are unable to pass urine.  You develop a skin rash.  You have a fever.  You have redness around your IV site that gets worse. Get help right away if:  You have difficulty breathing.  You have chest pain.  You have blood in your urine or stool, or you vomit blood. Summary  After the procedure, it is common to have a sore throat or nausea. It is also common to feel tired.  Have a responsible adult stay with you for the first 24 hours after general anesthesia. It is important to have someone help care for you until you are awake and alert.  When you feel hungry, start by eating small amounts of foods that are soft and easy to digest (bland), such as toast. Gradually return to your regular diet.  Drink enough fluid to keep your urine pale yellow.  Return to your normal activities as told by your health care provider. Ask your health care provider what activities are safe for you. This information is not   intended to replace advice given to you by your health care provider. Make sure you discuss any questions you have with your health care provider. Document Revised: 02/07/2017 Document Reviewed: 09/20/2016 Elsevier Patient Education  2020 [employer]  

## 2019-04-29 NOTE — Anesthesia Preprocedure Evaluation (Addendum)
Anesthesia Evaluation  Patient identified by MRN, date of birth, ID band Patient awake    Reviewed: Allergy & Precautions, NPO status   Airway Mallampati: II  TM Distance: >3 FB     Dental   Pulmonary neg pulmonary ROS,    breath sounds clear to auscultation       Cardiovascular  Rhythm:Regular Rate:Normal     Neuro/Psych PSYCHIATRIC DISORDERS Anxiety Depression    GI/Hepatic   Endo/Other  Obesity - BMI 36  Renal/GU      Musculoskeletal  (+) Arthritis , Fibromyalgia -  Abdominal   Peds  Hematology  (+) anemia ,   Anesthesia Other Findings   Reproductive/Obstetrics                             Anesthesia Physical Anesthesia Plan  ASA: II  Anesthesia Plan: General   Post-op Pain Management:    Induction: Intravenous  PONV Risk Score and Plan: Midazolam, Treatment may vary due to age or medical condition, Dexamethasone and Ondansetron  Airway Management Planned: Oral ETT  Additional Equipment:   Intra-op Plan:   Post-operative Plan:   Informed Consent: I have reviewed the patients History and Physical, chart, labs and discussed the procedure including the risks, benefits and alternatives for the proposed anesthesia with the patient or authorized representative who has indicated his/her understanding and acceptance.     Dental advisory given  Plan Discussed with: CRNA  Anesthesia Plan Comments:       Anesthesia Quick Evaluation

## 2019-04-29 NOTE — Anesthesia Postprocedure Evaluation (Signed)
Anesthesia Post Note  Patient: Ann Peters  Procedure(s) Performed: Frederik Schmidt REDUCTION WITH VIVEAR (Bilateral Nose)     Patient location during evaluation: PACU Anesthesia Type: General Level of consciousness: awake Pain management: pain level controlled Vital Signs Assessment: post-procedure vital signs reviewed and stable Respiratory status: respiratory function stable Cardiovascular status: stable Postop Assessment: no apparent nausea or vomiting Anesthetic complications: no    Jola Babinski

## 2019-04-29 NOTE — Transfer of Care (Signed)
Immediate Anesthesia Transfer of Care Note  Patient: Ann Peters  Procedure(s) Performed: Albin Felling REDUCTION WITH VIVEAR (Bilateral Nose)  Patient Location: PACU  Anesthesia Type: MAC  Level of Consciousness: awake, alert  and patient cooperative  Airway and Oxygen Therapy: Patient Spontanous Breathing and Patient connected to supplemental oxygen  Post-op Assessment: Post-op Vital signs reviewed, Patient's Cardiovascular Status Stable, Respiratory Function Stable, Patent Airway and No signs of Nausea or vomiting  Post-op Vital Signs: Reviewed and stable  Complications: No apparent anesthesia complications

## 2019-04-29 NOTE — Op Note (Signed)
04/29/2019  8:54 AM    Michaelle Copas  [id number]   Pre-Op Dx: Narrow nasal valves, airway obstruction, turbinate hypertrophy  Post-op Dx: Same  Proc: Destruction of nasal tissue using Vivaer to open the nasal valves, bilateral destruction of the inferior turbinates.  Surg:  Beverly Sessions Marko Skalski  Anes:  GOT  EBL: Minimal  Comp: None  Findings: Enlarged inferior turbinates more so on the right than the left..  Nasal valves were narrow because of the lateral wall cartilage protruding into the nasal passageway.  This was a persisting obstruction not a dynamic collapse  Procedure: Patient was brought to the operating room placed in a supine position.  She was given general anesthesia by oral endotracheal ovation.  Once she was asleep the nose visualized and the nasal valves were narrow, more so on the right side.  The septum was straight.  The inferior turbinates were enlarged especially on the right side.  1-1/2 mL of 1% Xylocaine with epi 1-100,000 were used for infiltration into the nasal valve area and the lateral wall of the nose.  The nose was packed with cottonoid pledgets soaked in phenylephrine and Xylocaine.  The patient was prepped and draped in a sterile fashion.  Cottonoid pledgets were removed and the nasal passage was revisualized.  The Vivaer was set up and the handpiece was placed in.  Handpiece was lubricated with gel and was used to remodel the lateral nasal valve in the nose.  It was placed over the upper portion of the lateral nasal valve and the energy was applied for 18 seconds with 12 seconds of cool down.  This was done 3 times on the left side in the lateral nasal valve area from top to bottom.  The procedure was then repeated on the right side from top to bottom as well remodeling.  Destroyed some of the tissue of the nasal valve.  This was done from top to bottom as well.  A fourth time was used on the right side to make sure this was remodeled well.  The inferior  turbinates then were approached with the vibrator handpiece and this was used for destruction of some of the soft tissue of the inferior turbinate.  This was done multiple times in the right inferior turbinate as this was larger and boggier.  The left side then had the inferior turbinate tissue approached in only 2 times was the energy applied here for destroying some tissue as the turbinate was somewhat smaller.  The airway was wide open on both sides and the right side now was about the same size as the left.  Patient tolerated procedure well.  She was awakened and taken to the recovery room in satisfactory condition.  There were no operative complications.  Dispo:   To PACU to be discharged home  Plan: To follow-up in the office in 1 week to make sure she is doing well.  She will rest with her head elevated today and slowly increase activities as tolerated.  She will use Vaseline inside her nose to help lubricate the nasal valves.  She will call if she has any problems.  I have given her some pain medication she can use if needed but otherwise will use Tylenol for pain.  Beverly Sessions Jahni Nazar  04/29/2019 8:54 AM

## 2019-04-30 ENCOUNTER — Encounter: Payer: Self-pay | Admitting: *Deleted

## 2019-05-24 ENCOUNTER — Other Ambulatory Visit: Payer: Self-pay

## 2019-05-24 ENCOUNTER — Other Ambulatory Visit: Payer: Self-pay | Admitting: Internal Medicine

## 2019-05-24 DIAGNOSIS — M797 Fibromyalgia: Secondary | ICD-10-CM

## 2019-05-27 ENCOUNTER — Other Ambulatory Visit: Payer: Self-pay | Admitting: Internal Medicine

## 2019-06-07 ENCOUNTER — Other Ambulatory Visit: Payer: Self-pay

## 2019-06-07 ENCOUNTER — Ambulatory Visit (INDEPENDENT_AMBULATORY_CARE_PROVIDER_SITE_OTHER): Payer: Medicare Other | Admitting: Internal Medicine

## 2019-06-07 ENCOUNTER — Encounter: Payer: Self-pay | Admitting: Internal Medicine

## 2019-06-07 VITALS — BP 124/82 | HR 79 | Temp 96.9°F | Ht 68.0 in | Wt 247.0 lb

## 2019-06-07 DIAGNOSIS — F3341 Major depressive disorder, recurrent, in partial remission: Secondary | ICD-10-CM

## 2019-06-07 DIAGNOSIS — R7303 Prediabetes: Secondary | ICD-10-CM

## 2019-06-07 DIAGNOSIS — E559 Vitamin D deficiency, unspecified: Secondary | ICD-10-CM | POA: Diagnosis not present

## 2019-06-07 DIAGNOSIS — E538 Deficiency of other specified B group vitamins: Secondary | ICD-10-CM | POA: Diagnosis not present

## 2019-06-07 DIAGNOSIS — G4700 Insomnia, unspecified: Secondary | ICD-10-CM | POA: Diagnosis not present

## 2019-06-07 DIAGNOSIS — R159 Full incontinence of feces: Secondary | ICD-10-CM | POA: Diagnosis not present

## 2019-06-07 DIAGNOSIS — R202 Paresthesia of skin: Secondary | ICD-10-CM

## 2019-06-07 MED ORDER — TRAZODONE HCL 100 MG PO TABS: 200.0000 mg | ORAL_TABLET | Freq: Every day | ORAL | 1 refills | Status: DC

## 2019-06-07 NOTE — Progress Notes (Signed)
Date:  06/07/2019   Name:  Ann Peters   DOB:  [birth year]   MRN:  [id number]   Chief Complaint: Insomnia (Follow up. ) and Throat restricted (Sinus issues, throat restricted, and tingling arms and tops of feet. Wants to rule out if she has M.S. Sometimes has bowel incontinence - whent hsi happens its like a "slushy."  Also feels off balance. Patient sister and Dads sister has M.S. )  Diabetes She presents for her follow-up diabetic visit. Diabetes type: pre-diabetes. Her disease course has been stable. Hypoglycemia symptoms include nervousness/anxiousness. Pertinent negatives for hypoglycemia include no headaches, speech difficulty or tremors. Pertinent negatives for diabetes include no chest pain, no fatigue and no weakness.  Depression        This is a chronic problem.The problem is unchanged.  Associated symptoms include appetite change.  Associated symptoms include no fatigue and no headaches.  Past treatments include SNRIs - Serotonin and norepinephrine reuptake inhibitors (on cymbalta and trazodone).  Compliance with treatment is good.  Previous treatment provided significant relief. Tingling in arms and feet - started several months ago.  Concerned that it might be MS. The tingling in her arms is constant. Also has it in her feet.  She also has the sensation of needing to move her feet and legs at night. Incontinence of stool - has had 4 episodes in April; mushy stool that comes out when she has no warning.  She consumes 3+ beverages per day sweetened with artificial sweetener.  She has periods of hard formed stool as well.  She takes probiotics but no fiber supplements or laxatives.  Immunization History  Administered Date(s) Administered  . Fluad Quad(high Dose 65+) 11/17/2018  . Influenza, High Dose Seasonal PF 11/05/2016, 02/04/2018  . Influenza,inj,Quad PF,6+ Mos 01/03/2015, 01/22/2016  . PFIZER SARS-COV-2 Vaccination 04/19/2019, 05/03/2019  . Pneumococcal Conjugate-13 10/03/2014    . Pneumococcal Polysaccharide-23 05/08/2015  . Tdap 10/03/2014  . Zoster 05/30/2011     Lab Results  Component Value Date   CREATININE 1.07 (H) 11/17/2018   BUN 20 11/17/2018   NA 140 11/17/2018   K 4.4 11/17/2018   CL 100 11/17/2018   CO2 23 11/17/2018   Lab Results  Component Value Date   CHOL 173 11/19/2013   HDL 51 11/19/2013   LDLCALC 100 11/19/2013   TRIG 111 11/19/2013   Lab Results  Component Value Date   TSH 3.710 11/17/2018   Lab Results  Component Value Date   HGBA1C 5.8 (H) 11/17/2018   Lab Results  Component Value Date   WBC 5.9 11/17/2018   HGB 14.0 11/17/2018   HCT 40.7 11/17/2018   MCV 91 11/17/2018   PLT 358 11/17/2018   Lab Results  Component Value Date   ALT 13 11/17/2018   AST 15 11/17/2018   ALKPHOS 103 11/17/2018   BILITOT 0.5 11/17/2018   Last vitamin D Lab Results  Component Value Date   VD25OH 30.3 02/17/2017      Review of Systems  Constitutional: Positive for appetite change. Negative for diaphoresis, fatigue, fever and unexpected weight change.  HENT: Negative for sore throat.   Respiratory: Positive for choking (full feeling in throat). Negative for chest tightness, shortness of breath and wheezing.   Cardiovascular: Negative for chest pain, palpitations and leg swelling.  Gastrointestinal: Positive for constipation and diarrhea. Negative for abdominal pain.  Genitourinary: Negative for difficulty urinating.  Musculoskeletal: Negative for arthralgias, gait problem (but children think her gait is "off".  no falls) and joint swelling.  Neurological: Positive for numbness (tingling in arms and feet that comes an goes). Negative for tremors, speech difficulty, weakness, light-headedness and headaches.  Hematological: Negative for adenopathy.  Psychiatric/Behavioral: Positive for depression, dysphoric mood and sleep disturbance. The patient is nervous/anxious.     Patient Active Problem List   Diagnosis Date Noted  .  Personal history of colonic polyps   . Anemia 02/17/2017  . Insomnia 11/05/2016  . Osteoarthritis of left hip 10/10/2015  . Status post left hip replacement 10/10/2015  . Onychomycosis of left great toe 05/10/2015  . Prediabetes 01/03/2015  . Anxiety 09/01/2014  . Diverticulitis 09/01/2014  . Vitamin D deficiency 09/01/2014  . Herpes zoster 09/01/2014  . Fibromyalgia 11/15/2013  . Recurrent major depressive disorder, in partial remission (HCC) 11/15/2013  . Vitamin B12 deficiency 12/23/2011  . Allergic rhinitis 04/18/2011    Allergies  Allergen Reactions  . Amoxicillin-Pot Clavulanate Nausea And Vomiting and Other (See Comments)    Other reaction(s): Vomiting  . Beef-Derived Products Nausea And Vomiting  . Pegademase Bovine Nausea And Vomiting  . Penicillins Nausea And Vomiting and Other (See Comments)    Has patient had a PCN reaction causing immediate rash, facial/tongue/throat swelling, SOB or lightheadedness with hypotension: no Has patient had a PCN reaction causing severe rash involving mucus membranes or skin necrosis: No Has patient had a PCN reaction that required hospitalization No Has patient had a PCN reaction occurring within the last 10 years: Yes If all of the above answers are "NO", then may proceed with Cephalosporin use.         Past Surgical History:  Procedure Laterality Date  . BLADDER REPAIR    . COLON SURGERY  2011   10 inches removed for benign mass, does not absorb nutrients as well  . COLONOSCOPY  2011   polyp removed- Dr Marva Panda  . COLONOSCOPY WITH PROPOFOL N/A 07/04/2017   Procedure: COLONOSCOPY WITH PROPOFOL;  Surgeon: Midge Minium, MD;  Location: [facility];  Service: Endoscopy;  Laterality: N/A;  recurrent shingles  . NASAL SINUS SURGERY    . TONSILLECTOMY    . TOTAL HIP ARTHROPLASTY Left 10/10/2015   Procedure: LEFT TOTAL HIP ARTHROPLASTY ANTERIOR APPROACH;  Surgeon: Kathryne Hitch, MD;  Location: [facility] OR;  Service:  Orthopedics;  Laterality: Left;  . TRIGGER FINGER RELEASE Left 03/28/2017   Procedure: MIDDLE FINGER DUPREYTRENS RELEASE;  Surgeon: Erin Sons, MD;  Location: [facility];  Service: Orthopedics;  Laterality: Left;  . TUBAL LIGATION    . TURBINATE REDUCTION Bilateral 04/29/2019   Procedure: TURBINATE REDUCTION WITH VIVEAR;  Surgeon: Vernie Murders, MD;  Location: [facility];  Service: ENT;  Laterality: Bilateral;  vivear  . VAGINAL HYSTERECTOMY      Social History   Tobacco Use  . Smoking status: Never Smoker  . Smokeless tobacco: Never Used  Substance Use Topics  . Alcohol use: No    Alcohol/week: 0.0 standard drinks  . Drug use: No     Medication list has been reviewed and updated.  Current Meds  Medication Sig  . Cholecalciferol (VITAMIN D3) 5000 UNITS CAPS Take 1 capsule (5,000 Units total) by mouth daily.  . Cyanocobalamin (VITAMIN B-12) 5000 MCG SUBL Place 15,000 mcg under the tongue daily.  . DULoxetine (CYMBALTA) 60 MG capsule TAKE 2 CAPSULES BY MOUTH DAILY  . fluticasone (FLONASE) 50 MCG/ACT nasal spray Place 2 sprays into both nostrils daily.  . Melatonin 1 MG TABS Take by mouth.  Marland Kitchen  Multiple Vitamins-Minerals (MULTIVITAMIN WOMEN 50+) TABS Take 1 tablet by mouth daily.  . sodium chloride (OCEAN) 0.65 % SOLN nasal spray Place 1 spray into both nostrils as needed for congestion.  . traZODone (DESYREL) 100 MG tablet TAKE 1 TABLET(100 MG) BY MOUTH AT BEDTIME AS NEEDED FOR SLEEP (Patient taking differently: Take 200 mg by mouth at bedtime. )  . valACYclovir (VALTREX) 1000 MG tablet TAKE 1 TABLET BY MOUTH EVERY DAY    PHQ 2/9 Scores 06/07/2019 03/08/2019 11/17/2018 07/06/2018  PHQ - 2 Score 2 0 0 0  PHQ- 9 Score 11 - 0 -    BP Readings from Last 3 Encounters:  06/07/19 124/82  04/29/19 (!) 113/46  02/22/19 117/81    Physical Exam Vitals and nursing note reviewed.  Constitutional:      General: She is not in acute distress.    Appearance: Normal  appearance. She is well-developed.  HENT:     Head: Normocephalic and atraumatic.  Cardiovascular:     Rate and Rhythm: Normal rate and regular rhythm.     Pulses: Normal pulses.     Heart sounds: No murmur.  Pulmonary:     Effort: Pulmonary effort is normal. No respiratory distress.  Musculoskeletal:        General: No tenderness. Normal range of motion.     Cervical back: Normal range of motion.     Right lower leg: No edema.     Left lower leg: No edema.  Lymphadenopathy:     Cervical: No cervical adenopathy.  Skin:    General: Skin is warm and dry.     Capillary Refill: Capillary refill takes less than 2 seconds.     Findings: No rash.  Neurological:     General: No focal deficit present.     Mental Status: She is alert and oriented to person, place, and time.     Cranial Nerves: Cranial nerves are intact.     Sensory: Sensation is intact.     Motor: Motor function is intact.     Gait: Gait is intact.     Deep Tendon Reflexes:     Reflex Scores:      Bicep reflexes are 2+ on the right side and 2+ on the left side.      Patellar reflexes are 2+ on the right side and 2+ on the left side. Psychiatric:        Attention and Perception: Attention normal.        Mood and Affect: Affect is tearful.        Speech: Speech normal.        Behavior: Behavior normal.        Thought Content: Thought content normal.     Wt Readings from Last 3 Encounters:  06/07/19 247 lb (112 kg)  04/29/19 247 lb (112 kg)  02/22/19 247 lb (112 kg)    BP 124/82   Pulse 79   Temp (!) 96.9 F (36.1 C) (Temporal)   Ht 5\' 8"  (1.727 m)   Wt 247 lb (112 kg)   SpO2 98%   BMI 37.56 kg/m   Assessment and Plan: 1. Recurrent major depressive disorder, in partial remission ([place]) Tearful today worrying over possible MS Will continue Cymbalta and trazodone Increase trazodone to 200 mg at hs  2. Tingling in extremities Check labs and vitamin B12 Continue supplements  Refer to Neurology for  further evaluation - CBC with Differential/Platelet - Comprehensive metabolic panel - Ambulatory referral to Neurology  3. Prediabetes Continue low carb diet - Hemoglobin A1c  4. Vitamin D deficiency Continue supplementation - VITAMIN D 25 Hydroxy (Vit-D Deficiency, Fractures)  5. Vitamin B12 deficiency Continue supplementation - Vitamin B12  6. Insomnia, unspecified type Increase to 200 mg qhs - traZODone (DESYREL) 100 MG tablet; Take 2 tablets (200 mg total) by mouth at bedtime.  Dispense: 180 tablet; Refill: 1  7. Incontinence of feces, unspecified fecal incontinence type Suspect this is due to intake of artificial sweeteners May continue probiotics If persistent, will need further evaluation   Partially dictated using Dragon software. Any errors are unintentional.  Bari Edward, MD [facility]  06/07/2019

## 2019-06-08 LAB — COMPREHENSIVE METABOLIC PANEL
ALT: 13 IU/L (ref 0–32)
AST: 19 IU/L (ref 0–40)
Albumin/Globulin Ratio: 2.2 (ref 1.2–2.2)
Albumin: 4.6 g/dL (ref 3.8–4.8)
Alkaline Phosphatase: 98 IU/L (ref 39–117)
BUN/Creatinine Ratio: 12 (ref 12–28)
BUN: 12 mg/dL (ref 8–27)
Bilirubin Total: 0.4 mg/dL (ref 0.0–1.2)
CO2: 24 mmol/L (ref 20–29)
Calcium: 9.7 mg/dL (ref 8.7–10.3)
Chloride: 103 mmol/L (ref 96–106)
Creatinine, Ser: 1 mg/dL (ref 0.57–1.00)
GFR calc Af Amer: 66 mL/min/{1.73_m2} (ref >59)
GFR calc non Af Amer: 58 mL/min/{1.73_m2} — ABNORMAL LOW (ref >59)
Globulin, Total: 2.1 g/dL (ref 1.5–4.5)
Glucose: 104 mg/dL — ABNORMAL HIGH (ref 65–99)
Potassium: 4.7 mmol/L (ref 3.5–5.2)
Sodium: 142 mmol/L (ref 134–144)
Total Protein: 6.7 g/dL (ref 6.0–8.5)

## 2019-06-08 LAB — HEMOGLOBIN A1C
Est. average glucose Bld gHb Est-mCnc: 120 mg/dL
Hgb A1c MFr Bld: 5.8 % — ABNORMAL HIGH (ref 4.8–5.6)

## 2019-06-08 LAB — CBC WITH DIFFERENTIAL/PLATELET
Basophils Absolute: 0 10*3/uL (ref 0.0–0.2)
Basos: 1 %
EOS (ABSOLUTE): 0.3 10*3/uL (ref 0.0–0.4)
Eos: 5 %
Hematocrit: 37.6 % (ref 34.0–46.6)
Hemoglobin: 12.8 g/dL (ref 11.1–15.9)
Immature Grans (Abs): 0 10*3/uL (ref 0.0–0.1)
Immature Granulocytes: 0 %
Lymphocytes Absolute: 1.6 10*3/uL (ref 0.7–3.1)
Lymphs: 31 %
MCH: 31.1 pg (ref 26.6–33.0)
MCHC: 34 g/dL (ref 31.5–35.7)
MCV: 91 fL (ref 79–97)
Monocytes Absolute: 0.4 10*3/uL (ref 0.1–0.9)
Monocytes: 7 %
Neutrophils Absolute: 2.8 10*3/uL (ref 1.4–7.0)
Neutrophils: 56 %
Platelets: 313 10*3/uL (ref 150–450)
RBC: 4.12 x10E6/uL (ref 3.77–5.28)
RDW: 13.8 % (ref 11.7–15.4)
WBC: 5.2 10*3/uL (ref 3.4–10.8)

## 2019-06-08 LAB — VITAMIN D 25 HYDROXY (VIT D DEFICIENCY, FRACTURES): Vit D, 25-Hydroxy: 33.5 ng/mL (ref 30.0–100.0)

## 2019-06-08 LAB — VITAMIN B12: Vitamin B-12: 1808 pg/mL — ABNORMAL HIGH (ref 232–1245)

## 2019-07-02 DIAGNOSIS — E531 Pyridoxine deficiency: Secondary | ICD-10-CM | POA: Diagnosis not present

## 2019-07-02 DIAGNOSIS — R202 Paresthesia of skin: Secondary | ICD-10-CM | POA: Diagnosis not present

## 2019-07-02 DIAGNOSIS — E519 Thiamine deficiency, unspecified: Secondary | ICD-10-CM | POA: Diagnosis not present

## 2019-07-02 DIAGNOSIS — E538 Deficiency of other specified B group vitamins: Secondary | ICD-10-CM | POA: Diagnosis not present

## 2019-07-02 DIAGNOSIS — R1310 Dysphagia, unspecified: Secondary | ICD-10-CM | POA: Diagnosis not present

## 2019-07-06 ENCOUNTER — Other Ambulatory Visit (HOSPITAL_COMMUNITY): Payer: Self-pay | Admitting: Neurology

## 2019-07-06 ENCOUNTER — Other Ambulatory Visit: Payer: Self-pay | Admitting: Neurology

## 2019-07-06 DIAGNOSIS — R202 Paresthesia of skin: Secondary | ICD-10-CM

## 2019-07-08 ENCOUNTER — Other Ambulatory Visit: Payer: Self-pay | Admitting: Neurology

## 2019-07-08 DIAGNOSIS — R1319 Other dysphagia: Secondary | ICD-10-CM

## 2019-07-08 DIAGNOSIS — R633 Feeding difficulties, unspecified: Secondary | ICD-10-CM

## 2019-07-18 ENCOUNTER — Ambulatory Visit
Admission: RE | Admit: 2019-07-18 | Discharge: 2019-07-18 | Disposition: A | Discharge status: Home / Self Care | Payer: Medicare Other | Source: Ambulatory Visit | Attending: Neurology | Admitting: Neurology

## 2019-07-18 DIAGNOSIS — R202 Paresthesia of skin: Secondary | ICD-10-CM | POA: Insufficient documentation

## 2019-07-18 DIAGNOSIS — M5023 Other cervical disc displacement, cervicothoracic region: Secondary | ICD-10-CM | POA: Diagnosis not present

## 2019-07-18 DIAGNOSIS — M47812 Spondylosis without myelopathy or radiculopathy, cervical region: Secondary | ICD-10-CM | POA: Diagnosis not present

## 2019-07-18 MED ORDER — GADOBUTROL 1 MMOL/ML IV SOLN
10.0000 mL | Freq: Once | INTRAVENOUS | Status: AC | PRN
  Administered 2019-07-18: 10 mL via INTRAVENOUS

## 2019-07-21 DIAGNOSIS — R202 Paresthesia of skin: Secondary | ICD-10-CM | POA: Diagnosis not present

## 2019-08-03 ENCOUNTER — Ambulatory Visit: Admission: RE | Admit: 2019-08-03 | Payer: Medicare Other | Source: Ambulatory Visit

## 2019-08-31 ENCOUNTER — Ambulatory Visit (INDEPENDENT_AMBULATORY_CARE_PROVIDER_SITE_OTHER): Payer: Medicare Other

## 2019-08-31 ENCOUNTER — Other Ambulatory Visit: Payer: Self-pay

## 2019-08-31 ENCOUNTER — Ambulatory Visit
Admission: EM | Admit: 2019-08-31 | Discharge: 2019-08-31 | Disposition: A | Discharge status: Home / Self Care | Payer: Medicare Other | Attending: Emergency Medicine | Admitting: Emergency Medicine

## 2019-08-31 DIAGNOSIS — Z20822 Contact with and (suspected) exposure to covid-19: Secondary | ICD-10-CM | POA: Diagnosis not present

## 2019-08-31 DIAGNOSIS — J90 Pleural effusion, not elsewhere classified: Secondary | ICD-10-CM

## 2019-08-31 DIAGNOSIS — J189 Pneumonia, unspecified organism: Secondary | ICD-10-CM

## 2019-08-31 DIAGNOSIS — R0989 Other specified symptoms and signs involving the circulatory and respiratory systems: Secondary | ICD-10-CM | POA: Diagnosis not present

## 2019-08-31 DIAGNOSIS — R0602 Shortness of breath: Secondary | ICD-10-CM | POA: Diagnosis not present

## 2019-08-31 MED ORDER — HYDROCOD POLST-CPM POLST ER 10-8 MG/5ML PO SUER: 5.0000 mL | Freq: Two times a day (BID) | ORAL | 0 refills | Status: DC | PRN

## 2019-08-31 MED ORDER — LEVOFLOXACIN 750 MG PO TABS: 750.0000 mg | ORAL_TABLET | Freq: Every day | ORAL | 0 refills | Status: DC

## 2019-08-31 MED ORDER — BENZONATATE 200 MG PO CAPS: 200.0000 mg | ORAL_CAPSULE | Freq: Three times a day (TID) | ORAL | 0 refills | Status: DC | PRN

## 2019-08-31 NOTE — ED Provider Notes (Signed)
HPI  SUBJECTIVE:  Ann Peters is a 69 y.o. female who presents with shortness of breath for the past week.  She states that she feels as if she "cannot get a full breath of air in".  She states it is worse with lying flat.  States it feels as if something is "pressing on my chest" when she lies down flat.  She reports nonproductive cough, body aches, loss of smell and taste, wheezing, dyspnea on exertion.  Some diarrhea today.  No fevers, headaches, nasal congestion, sore throat, chest pain, pressure, heaviness, nausea, vomiting, abdominal pain.  No known Covid exposure.  She got both doses of the Covid vaccine.  She states that she is unable to sleep at night due to the coughing.  No unintentional weight gain, lower extremity edema, nocturia.  She reports PND and orthopnea.  No GERD symptoms.  States that she has had symptoms like this before and was found to have pneumonia/bronchitis.  She has tried sleeping with her head elevated, Tylenol.  Symptoms are better with head elevation, leaning back and opening up her chest, worse with exertion and lying down flat.  No antibiotics in the past month.  No antipyretic in the past 6 hours.  She has a past medical history recurrent sinusitis status post sinus surgery.  No history of CHF, asthma, emphysema, COPD, smoking, diabetes, hypertension, coronary artery disease, MI, hypercholesterolemia, coronary artery disease.  No prolonged exposure to chemicals, asbestos.  PMD: Dr. Judithann Graves.    Past Medical History:  Diagnosis Date  . Allergy   . Anxiety   . Depression   . History of shingles    ON AND OFF 6 YEARS, recurrent episodes of internal and external shingles with resulting neuralgia  . Neuromuscular disorder (HCC)    POSTHERPETIC NEURALGIA, left wrist weakness  . Pneumonia    HX   SEVERAL TIMES  NONE IN 3 YRS  . Post herpetic neuralgia   . Scoliosis   . Shingles    history of.  Recurrent  . Sinusitis 10/18/2016    Past Surgical History:   Procedure Laterality Date  . BLADDER REPAIR    . COLON SURGERY  2011   10 inches removed for benign mass, does not absorb nutrients as well  . COLONOSCOPY  2011   polyp removed- Dr Marva Panda  . COLONOSCOPY WITH PROPOFOL N/A 07/04/2017   Procedure: COLONOSCOPY WITH PROPOFOL;  Surgeon: Midge Minium, MD;  Location: [facility];  Service: Endoscopy;  Laterality: N/A;  recurrent shingles  . NASAL SINUS SURGERY    . TONSILLECTOMY    . TOTAL HIP ARTHROPLASTY Left 10/10/2015   Procedure: LEFT TOTAL HIP ARTHROPLASTY ANTERIOR APPROACH;  Surgeon: Kathryne Hitch, MD;  Location: [facility] OR;  Service: Orthopedics;  Laterality: Left;  . TRIGGER FINGER RELEASE Left 03/28/2017   Procedure: MIDDLE FINGER DUPREYTRENS RELEASE;  Surgeon: Erin Sons, MD;  Location: [facility];  Service: Orthopedics;  Laterality: Left;  . TUBAL LIGATION    . TURBINATE REDUCTION Bilateral 04/29/2019   Procedure: TURBINATE REDUCTION WITH VIVEAR;  Surgeon: Vernie Murders, MD;  Location: [facility];  Service: ENT;  Laterality: Bilateral;  vivear  . VAGINAL HYSTERECTOMY      Family History  Problem Relation Age of Onset  . Lymphoma Mother   . CAD Father   . Breast cancer Sister 28  . Brain cancer Sister     Social History   Tobacco Use  . Smoking status: Never Smoker  . Smokeless tobacco:  Never Used  Vaping Use  . Vaping Use: Never used  Substance Use Topics  . Alcohol use: No    Alcohol/week: 0.0 standard drinks  . Drug use: No    No current facility-administered medications for this encounter.  Current Outpatient Medications:  .  Cholecalciferol (VITAMIN D3) 5000 UNITS CAPS, Take 1 capsule (5,000 Units total) by mouth daily., Disp: 30 capsule, Rfl:  .  Cyanocobalamin (VITAMIN B-12) 5000 MCG SUBL, Place 15,000 mcg under the tongue daily., Disp: , Rfl:  .  DULoxetine (CYMBALTA) 60 MG capsule, TAKE 2 CAPSULES BY MOUTH DAILY, Disp: 180 capsule, Rfl: 3 .  fluticasone (FLONASE) 50 MCG/ACT  nasal spray, Place 2 sprays into both nostrils daily., Disp: 16 g, Rfl: 0 .  Melatonin 1 MG TABS, Take by mouth., Disp: , Rfl:  .  Multiple Vitamins-Minerals (MULTIVITAMIN WOMEN 50+) TABS, Take 1 tablet by mouth daily., Disp: , Rfl:  .  sodium chloride (OCEAN) 0.65 % SOLN nasal spray, Place 1 spray into both nostrils as needed for congestion., Disp: , Rfl:  .  traZODone (DESYREL) 100 MG tablet, Take 2 tablets (200 mg total) by mouth at bedtime., Disp: 180 tablet, Rfl: 1 .  valACYclovir (VALTREX) 1000 MG tablet, TAKE 1 TABLET BY MOUTH EVERY DAY, Disp: 90 tablet, Rfl: 0 .  benzonatate (TESSALON) 200 MG capsule, Take 1 capsule (200 mg total) by mouth 3 (three) times daily as needed for cough., Disp: 30 capsule, Rfl: 0 .  chlorpheniramine-HYDROcodone (TUSSIONEX PENNKINETIC ER) 10-8 MG/5ML SUER, Take 5 mLs by mouth every 12 (twelve) hours as needed for cough., Disp: 60 mL, Rfl: 0 .  levofloxacin (LEVAQUIN) 750 MG tablet, Take 1 tablet (750 mg total) by mouth daily. X 5 days, Disp: 5 tablet, Rfl: 0  Allergies  Allergen Reactions  . Amoxicillin-Pot Clavulanate Nausea And Vomiting and Other (See Comments)    Other reaction(s): Vomiting  . Beef-Derived Products Nausea And Vomiting  . Pegademase Bovine Nausea And Vomiting  . Penicillins Nausea And Vomiting and Other (See Comments)    Has patient had a PCN reaction causing immediate rash, facial/tongue/throat swelling, SOB or lightheadedness with hypotension: no Has patient had a PCN reaction causing severe rash involving mucus membranes or skin necrosis: No Has patient had a PCN reaction that required hospitalization No Has patient had a PCN reaction occurring within the last 10 years: Yes If all of the above answers are "NO", then may proceed with Cephalosporin use.          ROS  As noted in HPI.   Physical Exam  BP (!) 134/92 (BP Location: Right Arm)   Pulse 80   Temp 98.3 F (36.8 C) (Oral)   Resp 18   Ht  (1.727 m)   Wt 112.5  kg   SpO2 97%   BMI 37.71 kg/m   Constitutional: Well developed, well nourished, no acute distress. Taking full sentences. Eyes:  EOMI, conjunctiva normal bilaterally HENT: Normocephalic, atraumatic,mucus membranes moist Respiratory: Normal inspiratory effort. Decreased breath sounds left lower lobe.  No rales, rhonchi, wheezing. Positive anterior, lateral chest wall tenderness Cardiovascular: Normal rate regular rhythm, no murmurs rubs or gallops. No JVD. GI: nondistended soft, nontender, no hepatomegaly. skin: No rash, skin intact Musculoskeletal: Trace edema bilateral lower extremities. No calf tenderness. Calves symmetric. Neurologic: Alert & oriented x 3, no focal neuro deficits Psychiatric: Speech and behavior appropriate   ED Course   Medications - No data to display  Orders Placed This Encounter  Procedures  .  SARS CORONAVIRUS 2 (TAT 6-24 HRS) Nasopharyngeal Nasopharyngeal Swab    Standing Status:   Standing    Number of Occurrences:   1    Order Specific Question:   Is this test for diagnosis or screening    Answer:   Diagnosis of ill patient    Order Specific Question:   Symptomatic for COVID-19 as defined by CDC    Answer:   Yes    Order Specific Question:   Date of Symptom Onset    Answer:   08/24/2019    Order Specific Question:   Hospitalized for COVID-19    Answer:   No    Order Specific Question:   Admitted to ICU for COVID-19    Answer:   No    Order Specific Question:   Previously tested for COVID-19    Answer:   No    Order Specific Question:   Resident in a congregate (group) care setting    Answer:   No    Order Specific Question:   Employed in healthcare setting    Answer:   No    Order Specific Question:   Pregnant    Answer:   No    Order Specific Question:   Has patient completed COVID vaccination(s) (2 doses of Pfizer/Moderna 1 dose of [employer])    Answer:   Yes  . DG Chest 2 View    Standing Status:   Standing    Number of  Occurrences:   1    Order Specific Question:   Reason for Exam (SYMPTOM  OR DIAGNOSIS REQUIRED)    Answer:   cough SOB decreased breath sounds LLL r/o PNA bronchitis  . Ambulatory referral to Pulmonology    Referral Priority:   Urgent    Referral Type:   Consultation    Referral Reason:   Specialty Services Required    Requested Specialty:   Pulmonary Disease    Number of Visits Requested:   1  . ED EKG    Standing Status:   Standing    Number of Occurrences:   1    Order Specific Question:   Reason for Exam    Answer:   Shortness of breath    Results for orders placed or performed during the hospital encounter of 08/31/19 (from the past 24 hour(s))  SARS CORONAVIRUS 2 (TAT 6-24 HRS) Nasopharyngeal Nasopharyngeal Swab     Status: None   Collection Time: 08/31/19  7:41 PM   Specimen: Nasopharyngeal Swab  Result Value Ref Range   SARS Coronavirus 2 NEGATIVE NEGATIVE   DG Chest 2 View  Result Date: 08/31/2019 CLINICAL DATA:  Shortness of breath. Decreased breath sounds left lower lobe. EXAM: CHEST - 2 VIEW COMPARISON:  Radiograph 05/13/2016 FINDINGS: Moderate left pleural effusion occupying the lower 1/3 of left hemithorax. Associated basilar opacity. The right lung is clear. Heart size is grossly normal, partially obscured by left pleural effusion/airspace disease. There is no pulmonary edema. No right pleural effusion. No pneumothorax. No acute osseous abnormalities are seen. IMPRESSION: Moderate left pleural effusion occupying the lower 1/3 of the left hemithorax. Associated basilar opacity may be compressive atelectasis versus pneumonia with parapneumonic effusion. Electronically Signed   By: Narda Rutherford M.D.   On: 08/31/2019 19:59    ED Clinical Impression  1. Pleural effusion   2. Community acquired pneumonia of left lower lobe of lung      ED Assessment/Plan  Given the focal lung findings, cough, wheezing,  concern for pneumonia. Will check chest x-ray. Also EKG because  of the shortness of breath. Covid PCR sent.  Reviewed [facility] narcotic database.  No opiate prescriptions since 04/2019.   Reviewed imaging independently.  Moderate left pleural effusion occupying the lower third of the left hemithorax.  Compressive atelectasis versus pneumonia with parapneumonic effusion.  See radiology report for full details.  Reviewed imaging independently.  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  See radiology report for full details.  will treat for pneumonia, place a referral to pulmonology for close follow-up.  Suspect that she will need CT scan and thoracentesis to evaluate for infection versus malignancy.Marland Kitchen  COVID negative.  Sending home with Levaquin 750 mg daily for 5 days as patient states she is unable to tolerate Augmentin.  She has tolerated Levaquin in the past without any problem.  Home with Tussionex, Tessalon.  Placed urgent pulmonology referral to [facility] pulmonary [place].  Also advised her to follow-up with her primary care physician.  To the ER if she gets worse.  Discussed labs, MDM, treatment plan, and plan for follow-up with patient. Discussed sn/sx that should prompt return to the ED. patient agrees with plan.   Meds ordered this encounter  Medications  . DISCONTD: levofloxacin (LEVAQUIN) 750 MG tablet    Sig: Take 1 tablet (750 mg total) by mouth daily. X 5 days    Dispense:  5 tablet    Refill:  0  . DISCONTD: chlorpheniramine-HYDROcodone (TUSSIONEX PENNKINETIC ER) 10-8 MG/5ML SUER    Sig: Take 5 mLs by mouth every 12 (twelve) hours as needed for cough.    Dispense:  60 mL    Refill:  0  . benzonatate (TESSALON) 200 MG capsule    Sig: Take 1 capsule (200 mg total) by mouth 3 (three) times daily as needed for cough.    Dispense:  30 capsule    Refill:  0  . chlorpheniramine-HYDROcodone (TUSSIONEX PENNKINETIC ER) 10-8 MG/5ML SUER    Sig: Take 5 mLs by mouth every 12 (twelve) hours as needed for  cough.    Dispense:  60 mL    Refill:  0  . levofloxacin (LEVAQUIN) 750 MG tablet    Sig: Take 1 tablet (750 mg total) by mouth daily. X 5 days    Dispense:  5 tablet    Refill:  0    *This clinic note was created using Scientist, clinical (histocompatibility and immunogenetics). Therefore, there may be occasional mistakes despite careful proofreading.   ?    Domenick Gong, MD 09/01/19 [phone]

## 2019-08-31 NOTE — Discharge Instructions (Addendum)
Finish the Levaquin, even if you feel better.  Tessalon for the cough during the day, Tussionex for the cough at night.  You must follow-up with a pulmonologist within the week.  Also follow-up with your primary care physician ASAP.  Go immediately to the ER for worsening shortness of breath, fevers above 100.4, or for other concerns.

## 2019-08-31 NOTE — ED Triage Notes (Signed)
Patient states that she has been having shortness of breath and cough x 1 week. States that she feels like her chest is tight.

## 2019-09-01 DIAGNOSIS — R079 Chest pain, unspecified: Secondary | ICD-10-CM | POA: Diagnosis not present

## 2019-09-01 LAB — SARS CORONAVIRUS 2 (TAT 6-24 HRS): SARS Coronavirus 2: NEGATIVE

## 2019-09-02 ENCOUNTER — Ambulatory Visit
Admission: RE | Admit: 2019-09-02 | Discharge: 2019-09-02 | Disposition: A | Discharge status: Home / Self Care | Payer: Medicare Other | Source: Ambulatory Visit | Attending: Pulmonary Disease | Admitting: Pulmonary Disease

## 2019-09-02 ENCOUNTER — Other Ambulatory Visit: Payer: Self-pay | Admitting: Internal Medicine

## 2019-09-02 ENCOUNTER — Encounter: Payer: Self-pay | Admitting: Pulmonary Disease

## 2019-09-02 ENCOUNTER — Ambulatory Visit (INDEPENDENT_AMBULATORY_CARE_PROVIDER_SITE_OTHER): Payer: Medicare Other | Admitting: Pulmonary Disease

## 2019-09-02 ENCOUNTER — Other Ambulatory Visit: Payer: Self-pay

## 2019-09-02 ENCOUNTER — Ambulatory Visit
Admission: RE | Admit: 2019-09-02 | Discharge: 2019-09-02 | Disposition: A | Discharge status: Home / Self Care | Payer: Medicare Other | Source: Ambulatory Visit | Attending: Diagnostic Radiology | Admitting: Diagnostic Radiology

## 2019-09-02 VITALS — BP 120/80 | HR 103 | Temp 98.0°F | Ht 68.0 in | Wt 250.6 lb

## 2019-09-02 DIAGNOSIS — J189 Pneumonia, unspecified organism: Secondary | ICD-10-CM

## 2019-09-02 DIAGNOSIS — J90 Pleural effusion, not elsewhere classified: Secondary | ICD-10-CM | POA: Diagnosis not present

## 2019-09-02 DIAGNOSIS — Z9889 Other specified postprocedural states: Secondary | ICD-10-CM

## 2019-09-02 DIAGNOSIS — J9811 Atelectasis: Secondary | ICD-10-CM | POA: Diagnosis not present

## 2019-09-02 DIAGNOSIS — R202 Paresthesia of skin: Secondary | ICD-10-CM | POA: Diagnosis not present

## 2019-09-02 LAB — BODY FLUID CELL COUNT WITH DIFFERENTIAL
Eos, Fluid: 0 %
Lymphs, Fluid: 75 %
Monocyte-Macrophage-Serous Fluid: 17 %
Neutrophil Count, Fluid: 8 %
Total Nucleated Cell Count, Fluid: 2376 uL

## 2019-09-02 LAB — PROTEIN, PLEURAL OR PERITONEAL FLUID: Total protein, fluid: 3.8 g/dL

## 2019-09-02 LAB — ALBUMIN, PLEURAL OR PERITONEAL FLUID: Albumin, Fluid: 2.6 g/dL

## 2019-09-02 LAB — LACTATE DEHYDROGENASE, PLEURAL OR PERITONEAL FLUID: LD, Fluid: 85 U/L — ABNORMAL HIGH (ref 3–23)

## 2019-09-02 LAB — GLUCOSE, PLEURAL OR PERITONEAL FLUID: Glucose, Fluid: 119 mg/dL

## 2019-09-02 MED ORDER — LEVOFLOXACIN 750 MG PO TABS: 750.0000 mg | ORAL_TABLET | Freq: Every day | ORAL | 0 refills | Status: AC

## 2019-09-02 NOTE — Progress Notes (Signed)
Subjective:    Patient ID: Ann Peters, female    DOB: 03-Sep-1950, 69 y.o.   MRN: [id number]  HPI Patient is a 69 year old lifelong never smoker who presents for evaluation of a LEFT pleural effusion.  Referred by Dr. Domenick Gong from the [facility] [place].  Patient's primary care physician is Dr. Bari Edward.  Patient presented to urgent care on 13 July with a complaint of shortness of breath for approximately 1 week.  She felt that she could not get a full breath of air in and this prompted her to seek evaluation at the urgent care.  She states that it is worse with laying flat.  The patient  not note any fevers, headaches, nasal congestion, sore throat, chest pain though she does have chest pressure.  She was evaluated and noted to have a left pleural effusion with compressive atelectasis by chest x-ray.  She was started on Levaquin 750 mg which she is still taking.  Has chronic sinus issues but does not recall any foul sinus drainage.  Since her visit at the urgent care he has not developed any other issues no cough, sputum production nor hemoptysis.  She is up-to-date on COVID-19 vaccination and was tested at the urgent care was negative for COVID-19.  Has noted some orthopnea but no paroxysmal nocturnal dyspnea.  No lower extremity edema.  Review of Systems A 10 point review of systems was performed and it is as noted above otherwise negative.  Past Medical History:  Diagnosis Date  . Allergy   . Anxiety   . Depression   . History of shingles    ON AND OFF 6 YEARS, recurrent episodes of internal and external shingles with resulting neuralgia  . Neuromuscular disorder (HCC)    POSTHERPETIC NEURALGIA, left wrist weakness  . Pneumonia    HX   SEVERAL TIMES  NONE IN 3 YRS  . Post herpetic neuralgia   . Scoliosis   . Shingles    history of.  Recurrent  . Sinusitis 10/18/2016   Past Surgical History:  Procedure Laterality Date  . BLADDER REPAIR    . COLON  SURGERY  2011   10 inches removed for benign mass, does not absorb nutrients as well  . COLONOSCOPY  2011   polyp removed- Dr Marva Panda  . COLONOSCOPY WITH PROPOFOL N/A 07/04/2017   Procedure: COLONOSCOPY WITH PROPOFOL;  Surgeon: Midge Minium, MD;  Location: [facility];  Service: Endoscopy;  Laterality: N/A;  recurrent shingles  . NASAL SINUS SURGERY    . TONSILLECTOMY    . TOTAL HIP ARTHROPLASTY Left 10/10/2015   Procedure: LEFT TOTAL HIP ARTHROPLASTY ANTERIOR APPROACH;  Surgeon: Kathryne Hitch, MD;  Location: [facility] OR;  Service: Orthopedics;  Laterality: Left;  . TRIGGER FINGER RELEASE Left 03/28/2017   Procedure: MIDDLE FINGER DUPREYTRENS RELEASE;  Surgeon: Erin Sons, MD;  Location: [facility];  Service: Orthopedics;  Laterality: Left;  . TUBAL LIGATION    . TURBINATE REDUCTION Bilateral 04/29/2019   Procedure: TURBINATE REDUCTION WITH VIVEAR;  Surgeon: Vernie Murders, MD;  Location: [facility];  Service: ENT;  Laterality: Bilateral;  vivear  . VAGINAL HYSTERECTOMY     Family History  Problem Relation Age of Onset  . Lymphoma Mother   . CAD Father   . Breast cancer Sister 84  . Brain cancer Sister    Social History   Tobacco Use  . Smoking status: Never Smoker  . Smokeless tobacco: Never Used  Substance Use Topics  . Alcohol use: No    Alcohol/week: 0.0 standard drinks   Allergies  Allergen Reactions  . Amoxicillin-Pot Clavulanate Nausea And Vomiting and Other (See Comments)    Other reaction(s): Vomiting  . Beef-Derived Products Nausea And Vomiting  . Pegademase Bovine Nausea And Vomiting  . Penicillins Nausea And Vomiting and Other (See Comments)    Has patient had a PCN reaction causing immediate rash, facial/tongue/throat swelling, SOB or lightheadedness with hypotension: no Has patient had a PCN reaction causing severe rash involving mucus membranes or skin necrosis: No Has patient had a PCN reaction that required hospitalization  No Has patient had a PCN reaction occurring within the last 10 years: Yes If all of the above answers are "NO", then may proceed with Cephalosporin use.        Current Meds  Medication Sig  . benzonatate (TESSALON) 200 MG capsule Take 1 capsule (200 mg total) by mouth 3 (three) times daily as needed for cough.  . chlorpheniramine-HYDROcodone (TUSSIONEX PENNKINETIC ER) 10-8 MG/5ML SUER Take 5 mLs by mouth every 12 (twelve) hours as needed for cough.  . Cholecalciferol (VITAMIN D3) 5000 UNITS CAPS Take 1 capsule (5,000 Units total) by mouth daily.  . Cyanocobalamin (VITAMIN B-12) 5000 MCG SUBL Place 15,000 mcg under the tongue daily.  . DULoxetine (CYMBALTA) 60 MG capsule TAKE 2 CAPSULES BY MOUTH DAILY  . fluticasone (FLONASE) 50 MCG/ACT nasal spray Place 2 sprays into both nostrils daily.  Marland Kitchen levofloxacin (LEVAQUIN) 750 MG tablet Take 1 tablet (750 mg total) by mouth daily for 3 days.  . Melatonin 1 MG TABS Take by mouth.  . Multiple Vitamins-Minerals (MULTIVITAMIN WOMEN 50+) TABS Take 1 tablet by mouth daily.  . sodium chloride (OCEAN) 0.65 % SOLN nasal spray Place 1 spray into both nostrils as needed for congestion.  . traZODone (DESYREL) 100 MG tablet Take 2 tablets (200 mg total) by mouth at bedtime.  . valACYclovir (VALTREX) 1000 MG tablet TAKE 1 TABLET BY MOUTH EVERY DAY  . [DISCONTINUED] levofloxacin (LEVAQUIN) 750 MG tablet Take 1 tablet (750 mg total) by mouth daily. X 5 days   Immunization History  Administered Date(s) Administered  . Fluad Quad(high Dose 65+) 11/17/2018  . Influenza, High Dose Seasonal PF 11/05/2016, 02/04/2018  . Influenza,inj,Quad PF,6+ Mos 01/03/2015, 01/22/2016  . PFIZER SARS-COV-2 Vaccination 04/19/2019, 05/03/2019  . Pneumococcal Conjugate-13 10/03/2014  . Pneumococcal Polysaccharide-23 05/08/2015  . Tdap 10/03/2014  . Zoster 05/30/2011       Objective:   Physical Exam BP 120/80 (BP Location: Right Arm, Cuff Size: Large)   Pulse (!) 103   Temp  98 F (36.7 C) (Oral)   Ht 5\' 8"  (1.727 m)   Wt 250 lb 9.6 oz (113.7 kg)   SpO2 95%   BMI 38.10 kg/m   GENERAL: Obese woman, alert, no acute distress.  No tachypnea noted. HEAD: Normocephalic, atraumatic.  EYES: Pupils equal, round, reactive to light.  No scleral icterus.  MOUTH: Nose/mouth/throat not examined due to masking requirements for COVID 19. NECK: Supple. No thyromegaly. Trachea midline. No JVD.  No adenopathy. PULMONARY: Diminished breath sounds noted on the left base dullness to percussion.  POCUS to left chest consistent with pleural effusion and compressive atelectasis.  It appears to be free-flowing.  No fibrinous debris or septations noted. CARDIOVASCULAR: S1 and S2. Regular rate and rhythm.  No rubs, murmurs or gallops heard. GASTROINTESTINAL: Abdomen is obese otherwise unremarkable. MUSCULOSKELETAL: No joint deformity, no clubbing, no edema.  NEUROLOGIC: No focal deficit noted.  Occasionally searches for words.  Speech is fluent otherwise. SKIN: Intact,warm,dry.  Limited exam no rashes. PSYCH: Mood and behavior are normal.  Chest x-ray performed 31 August 2019, independently reviewed: Patient has moderate left pleural effusion with compressive atelectasis noted on the LEFT lung.           Assessment & Plan:     ICD-10-CM   1. Pleural effusion on left  J90 US THORACENTESIS ASP PLEURAL SPACE W/IMG GUIDE   Thoracentesis with ultrasound guidance today in radiology Thoracentesis fluid panel Consider CT chest pending postthoracentesis film Follow-up 1 to 2 weeks  2. Community acquired pneumonia of left lower lobe of lung  J18.9    She is on Levaquin 750 mg Will add 3 more tablets of Levaquin to her regimen for a total of 8 days    Meds ordered this encounter  Medications  . levofloxacin (LEVAQUIN) 750 MG tablet    Sig: Take 1 tablet (750 mg total) by mouth daily for 3 days.    Dispense:  3 tablet    Refill:  0   Orders Placed This Encounter  Procedures   . US THORACENTESIS ASP PLEURAL SPACE W/IMG GUIDE    Standing Status:   Future    Number of Occurrences:   1    Standing Expiration Date:   10/03/2019    Order Specific Question:   Are labs required for specimen collection?    Answer:   Yes    Order Specific Question:   Lab orders requested (DO NOT place separate lab orders, these will be automatically ordered during procedure specimen collection):    Answer:   Acid Fast Culture With Reflexed Sensitivities    Order Specific Question:   Lab orders requested (DO NOT place separate lab orders, these will be automatically ordered during procedure specimen collection):    Answer:   Acid Fast Smear (Afb)    Order Specific Question:   Lab orders requested (DO NOT place separate lab orders, these will be automatically ordered during procedure specimen collection):    Answer:   Albumin, Body Fluid    Order Specific Question:   Lab orders requested (DO NOT place separate lab orders, these will be automatically ordered during procedure specimen collection):    Answer:   Body Fluid Cell Count With Differential    Order Specific Question:   Lab orders requested (DO NOT place separate lab orders, these will be automatically ordered during procedure specimen collection):    Answer:   Body Fluid Culture    Order Specific Question:   Lab orders requested (DO NOT place separate lab orders, these will be automatically ordered during procedure specimen collection):    Answer:   Cytology - Non Pap    Order Specific Question:   Lab orders requested (DO NOT place separate lab orders, these will be automatically ordered during procedure specimen collection):    Answer:   Fungus Culture With Stain    Order Specific Question:   Lab orders requested (DO NOT place separate lab orders, these will be automatically ordered during procedure specimen collection):    Answer:   Glucose, Pleural Or Peritoneal Fluid    Order Specific Question:   Lab orders requested (DO NOT place  separate lab orders, these will be automatically ordered during procedure specimen collection):    Answer:   Gram Stain    Order Specific Question:   Lab orders requested (DO NOT place separate lab orders, these  will be automatically ordered during procedure specimen collection):    Answer:   Lactate Dehydrogenase, Body Fluid    Order Specific Question:   Lab orders requested (DO NOT place separate lab orders, these will be automatically ordered during procedure specimen collection):    Answer:   Ph, Body Fluid    Order Specific Question:   Lab orders requested (DO NOT place separate lab orders, these will be automatically ordered during procedure specimen collection):    Answer:   Protein, Pleural Or Peritoneal Fluid    Order Specific Question:   Reason for Exam (SYMPTOM  OR DIAGNOSIS REQUIRED)    Answer:   LEFT pleural effusion    Order Specific Question:   Preferred [facility] location?    [facility]   Discussion:  Patient has a left pleural effusion that needs thoracentesis.  We are not set up in our clinic to perform these types of procedures.  I have spoken with radiology and they have been gracious to accommodate the patient today to do the procedure.  This is likely related to community-acquired pneumonia, no organism specified, LEFT lower lobe.  We will extend her antibiotic therapy to complete 7 to 8 days of Levaquin.  She states she cannot tolerate any other antibiotic.  We will review postthoracentesis film and determine whether the patient may need CT scan of the chest at that point.  We will follow up on thoracentesis fluid analysis.  Patient will be seen back in 1 to 2 weeks time.  She is to call sooner should any new difficulties arise.  Gailen Shelter. Laura Rosann Gorum, MD  PCCM   *This note was dictated using voice recognition software/Dragon.  Despite best efforts to proofread, errors can occur which can change the meaning.  Any change was purely unintentional.

## 2019-09-02 NOTE — Procedures (Signed)
Interventional Radiology Procedure:   Indications: Left pleural effusion  Procedure: US guided thoracentesis  Findings: Removed 800 ml from left chest.  Complications: None     EBL: Less than 10 ml  Plan: Follow up CXR   Ann Spark R. Lowella Dandy, MD  Pager: [phone]

## 2019-09-02 NOTE — Patient Instructions (Signed)
We will see you in 1 to 2 weeks time.  Having a pleural tap today.   We will send some extra antibiotic to your pharmacy.

## 2019-09-03 ENCOUNTER — Other Ambulatory Visit: Payer: Self-pay

## 2019-09-03 DIAGNOSIS — J90 Pleural effusion, not elsewhere classified: Secondary | ICD-10-CM

## 2019-09-03 LAB — ACID FAST SMEAR (AFB, MYCOBACTERIA): Acid Fast Smear: NEGATIVE

## 2019-09-03 LAB — PH, BODY FLUID: pH, Body Fluid: 7.5

## 2019-09-06 LAB — BODY FLUID CULTURE: Culture: NO GROWTH

## 2019-09-06 LAB — CYTOLOGY - NON PAP

## 2019-09-09 ENCOUNTER — Ambulatory Visit
Admission: RE | Admit: 2019-09-09 | Discharge: 2019-09-09 | Disposition: A | Discharge status: Home / Self Care | Payer: Medicare Other | Attending: Pulmonary Disease | Admitting: Pulmonary Disease

## 2019-09-09 ENCOUNTER — Other Ambulatory Visit: Payer: Self-pay

## 2019-09-09 ENCOUNTER — Ambulatory Visit
Admission: RE | Admit: 2019-09-09 | Discharge: 2019-09-09 | Disposition: A | Discharge status: Home / Self Care | Payer: Medicare Other | Source: Ambulatory Visit | Attending: Pulmonary Disease | Admitting: Pulmonary Disease

## 2019-09-09 ENCOUNTER — Encounter: Payer: Self-pay | Admitting: Pulmonary Disease

## 2019-09-09 ENCOUNTER — Ambulatory Visit (INDEPENDENT_AMBULATORY_CARE_PROVIDER_SITE_OTHER): Payer: Medicare Other | Admitting: Pulmonary Disease

## 2019-09-09 VITALS — BP 122/70 | HR 82 | Temp 97.6°F | Ht 68.0 in | Wt 246.2 lb

## 2019-09-09 DIAGNOSIS — J189 Pneumonia, unspecified organism: Secondary | ICD-10-CM

## 2019-09-09 DIAGNOSIS — J90 Pleural effusion, not elsewhere classified: Secondary | ICD-10-CM

## 2019-09-09 NOTE — Patient Instructions (Signed)
We are going to schedule you for another tap  We will also schedule a CT scan of the chest after the tap is done   We will see you in follow-up after these tests are done.

## 2019-09-09 NOTE — Progress Notes (Signed)
Subjective:    Patient ID: Ann Peters, female    DOB: [birth year], 69 y.o.   MRN: [id number]  HPI Patient is a 69 year old flow never smoker, who presents for follow-up on a LEFT pleural effusion.  She was initially seen on 02 September 2019.  For the details of that visit please see the note of that day.  She had 800 mL of exudative fluid drained via ultrasound-guided thoracentesis that day.  The fluid is lymphocytic predominant with markers that appear to be inflammatory.  Glucose was 119.  LDH mildly elevated.  The fluid was consistent with a parapneumonic effusion.  Patient has completed Levaquin therapy for presumed left lower lobe pneumonia, no organism specified.  Since her initial visit here the patient has felt markedly better.  Dyspnea improved.  She has not had any further issues with orthopnea which she was having previously.  She had chest x-ray performed today on follow-up and fluid appears to have recurred.  We discussed the possibilities and need for repeat thoracentesis and further studies.  Patient agrees to proceed with the same.  Patient has not had any fevers, chills or sweats since her initial evaluation here.  No cough or sputum production.  She continues to have issues with sinus pressure and drainage, this is a chronic issue for her she has had recent sinus surgery in February 2021.  No other complaints are voiced.  Review of Systems A 10 point review of systems was performed and it is as noted above otherwise negative.  Allergies  Allergen Reactions  . Amoxicillin-Pot Clavulanate Nausea And Vomiting and Other (See Comments)    Other reaction(s): Vomiting  . Beef-Derived Products Nausea And Vomiting  . Pegademase Bovine Nausea And Vomiting  . Penicillins Nausea And Vomiting and Other (See Comments)    Has patient had a PCN reaction causing immediate rash, facial/tongue/throat swelling, SOB or lightheadedness with hypotension: no Has patient had a PCN reaction causing severe  rash involving mucus membranes or skin necrosis: No Has patient had a PCN reaction that required hospitalization No Has patient had a PCN reaction occurring within the last 10 years: Yes If all of the above answers are "NO", then may proceed with Cephalosporin use.        Current Meds  Medication Sig  . benzonatate (TESSALON) 200 MG capsule Take 1 capsule (200 mg total) by mouth 3 (three) times daily as needed for cough.  . chlorpheniramine-HYDROcodone (TUSSIONEX PENNKINETIC ER) 10-8 MG/5ML SUER Take 5 mLs by mouth every 12 (twelve) hours as needed for cough.  . Cholecalciferol (VITAMIN D3) 5000 UNITS CAPS Take 1 capsule (5,000 Units total) by mouth daily.  . Cyanocobalamin (VITAMIN B-12) 5000 MCG SUBL Place 15,000 mcg under the tongue daily.  . DULoxetine (CYMBALTA) 60 MG capsule TAKE 2 CAPSULES BY MOUTH DAILY  . fluticasone (FLONASE) 50 MCG/ACT nasal spray Place 2 sprays into both nostrils daily.  . Melatonin 1 MG TABS Take by mouth.  . Multiple Vitamins-Minerals (MULTIVITAMIN WOMEN 50+) TABS Take 1 tablet by mouth daily.  . sodium chloride (OCEAN) 0.65 % SOLN nasal spray Place 1 spray into both nostrils as needed for congestion.  . traZODone (DESYREL) 100 MG tablet Take 2 tablets (200 mg total) by mouth at bedtime.  . valACYclovir (VALTREX) 1000 MG tablet TAKE 1 TABLET BY MOUTH EVERY DAY   Immunization History  Administered Date(s) Administered  . Fluad Quad(high Dose 65+) 11/17/2018  . Influenza, High Dose Seasonal PF 11/05/2016, 02/04/2018  . Influenza,inj,Quad  PF,6+ Mos 01/03/2015, 01/22/2016  . PFIZER SARS-COV-2 Vaccination 04/19/2019, 05/03/2019  . Pneumococcal Conjugate-13 10/03/2014  . Pneumococcal Polysaccharide-23 05/08/2015  . Tdap 10/03/2014  . Zoster 05/30/2011       Objective:   Physical Exam BP 122/70 (BP Location: Left Arm, Cuff Size: Large)   Pulse 82   Temp 97.6 F (36.4 C) (Temporal)   Ht 5\' 8"  (1.727 m)   Wt (!) 246 lb 3.2 oz (111.7 kg)   SpO2 96%  Comment: room air  BMI 37.43 kg/m   GENERAL: Obese woman, alert, no acute distress.  No tachypnea noted. HEAD: Normocephalic, atraumatic.  EYES: Pupils equal, round, reactive to light.  No scleral icterus.  MOUTH: Nose/mouth/throat not examined due to masking requirements for COVID 19. NECK: Supple. No thyromegaly. Trachea midline. No JVD.  No adenopathy. PULMONARY: Diminished breath sounds noted on the left base dullness to percussion otherwise lungs are clear.  CARDIOVASCULAR: S1 and S2. Regular rate and rhythm.  No rubs, murmurs or gallops heard. GASTROINTESTINAL: Abdomen is obese otherwise unremarkable. MUSCULOSKELETAL: No joint deformity, no clubbing, no edema.   NEUROLOGIC: No focal deficit noted.  Occasionally searches for words.  Speech is fluent otherwise. SKIN: Intact,warm,dry.  Limited exam no rashes. PSYCH: Mood and behavior are normal.   Chest x-ray performed today: Recurrent pleural effusion on the left.      Assessment & Plan:     ICD-10-CM   1. Recurrent pleural effusion on left  J90    Reschedule thoracentesis under ultrasound guidance CT chest post thoracentesis to further evaluate Repeat cell count on differential as well as cytology  2. Community acquired pneumonia of left lower lobe of lung  J18.9    No organism specified Completed Levaquin   Orders Placed This Encounter  Procedures  . THORACENTESIS ASP PLEURAL SPACE W/IMG GUIDE    Thoracentesis of Left side  CT chest with contrast to be done after procedure    Standing Status:   Future    Standing Expiration Date:   09/08/2020    Order Specific Question:   Are labs required for specimen collection?    Answer:   Yes    Order Specific Question:   Lab orders requested (DO NOT place separate lab orders, these will be automatically ordered during procedure specimen collection):    Answer:   Cytology - Non Pap    Order Specific Question:   Lab orders requested (DO NOT place separate lab orders, these will  be automatically ordered during procedure specimen collection):    Answer:   Body Fluid Cell Count With Differential    Order Specific Question:   Reason for Exam (SYMPTOM  OR DIAGNOSIS REQUIRED)    Answer:   recurrent plueral effusion    Order Specific Question:   Preferred imaging location?    Answer:   [facility]  . CT Chest W Contrast    Standing Status:   Future    Standing Expiration Date:   09/08/2020    Scheduling Instructions:     To be done after 09/10/2020 guided thoracentesis    Order Specific Question:   If indicated for the ordered procedure, I authorize the administration of contrast media per Radiology protocol    Answer:   Yes    Order Specific Question:   Preferred imaging location?    Answer:   [facility]    Order Specific Question:   Radiology Contrast Protocol - do NOT remove file path    Answer:   \\  [email or web address]   Discussion:  Patient has recurrent left pleural effusion.  This appears to be inflammatory parapneumonic effusion by prior thoracentesis performed 02 September 2019.  We will proceed with repeating thoracentesis under ultrasound guidance.  We will repeat cell count with differential and cytology.  We will proceed also with obtaining a CT scan of the chest to evaluate for potential abnormalities.  We will see the patient in follow-up after the above is done.   Gailen Shelter, MD [place] [facility]   *This note was dictated using voice recognition software/Dragon.  Despite best efforts to proofread, errors can occur which can change the meaning.  Any change was purely unintentional.

## 2019-09-10 NOTE — Addendum Note (Signed)
Addended by: Melonie Florida on: 09/10/2019 09:42 AM   Modules accepted: Orders

## 2019-09-13 ENCOUNTER — Other Ambulatory Visit
Admission: RE | Admit: 2019-09-13 | Discharge: 2019-09-13 | Disposition: A | Discharge status: Home / Self Care | Payer: Medicare Other | Attending: Pulmonary Disease | Admitting: Pulmonary Disease

## 2019-09-13 ENCOUNTER — Ambulatory Visit: Payer: Medicare Other | Admitting: Pulmonary Disease

## 2019-09-13 DIAGNOSIS — J301 Allergic rhinitis due to pollen: Secondary | ICD-10-CM | POA: Diagnosis not present

## 2019-09-13 DIAGNOSIS — J90 Pleural effusion, not elsewhere classified: Secondary | ICD-10-CM | POA: Diagnosis not present

## 2019-09-13 DIAGNOSIS — J0181 Other acute recurrent sinusitis: Secondary | ICD-10-CM | POA: Diagnosis not present

## 2019-09-13 LAB — BASIC METABOLIC PANEL
Anion gap: 8 (ref 5–15)
BUN: 15 mg/dL (ref 8–23)
CO2: 26 mmol/L (ref 22–32)
Calcium: 9.1 mg/dL (ref 8.9–10.3)
Chloride: 105 mmol/L (ref 98–111)
Creatinine, Ser: 1.19 mg/dL — ABNORMAL HIGH (ref 0.44–1.00)
GFR calc Af Amer: 54 mL/min — ABNORMAL LOW (ref >60)
GFR calc non Af Amer: 46 mL/min — ABNORMAL LOW (ref >60)
Glucose, Bld: 104 mg/dL — ABNORMAL HIGH (ref 70–99)
Potassium: 4 mmol/L (ref 3.5–5.1)
Sodium: 139 mmol/L (ref 135–145)

## 2019-09-14 ENCOUNTER — Ambulatory Visit
Admission: RE | Admit: 2019-09-14 | Discharge: 2019-09-14 | Disposition: A | Discharge status: Home / Self Care | Payer: Medicare Other | Source: Ambulatory Visit | Attending: Pulmonary Disease | Admitting: Pulmonary Disease

## 2019-09-14 ENCOUNTER — Other Ambulatory Visit: Payer: Self-pay | Admitting: Pulmonary Disease

## 2019-09-14 ENCOUNTER — Other Ambulatory Visit: Payer: Self-pay

## 2019-09-14 DIAGNOSIS — J90 Pleural effusion, not elsewhere classified: Secondary | ICD-10-CM | POA: Diagnosis not present

## 2019-09-14 DIAGNOSIS — I700 Atherosclerosis of aorta: Secondary | ICD-10-CM | POA: Insufficient documentation

## 2019-09-14 DIAGNOSIS — I313 Pericardial effusion (noninflammatory): Secondary | ICD-10-CM | POA: Insufficient documentation

## 2019-09-14 DIAGNOSIS — I319 Disease of pericardium, unspecified: Secondary | ICD-10-CM

## 2019-09-14 LAB — BODY FLUID CELL COUNT WITH DIFFERENTIAL
Eos, Fluid: 0 %
Lymphs, Fluid: 90 %
Monocyte-Macrophage-Serous Fluid: 2 %
Neutrophil Count, Fluid: 8 %
Other Cells, Fluid: 0 %
Total Nucleated Cell Count, Fluid: 4850 cu mm

## 2019-09-14 LAB — POCT I-STAT CREATININE: Creatinine, Ser: 1.2 mg/dL — ABNORMAL HIGH (ref 0.44–1.00)

## 2019-09-14 MED ORDER — IOHEXOL 300 MG/ML  SOLN
75.0000 mL | Freq: Once | INTRAMUSCULAR | Status: AC | PRN
  Administered 2019-09-14: 75 mL via INTRAVENOUS

## 2019-09-14 NOTE — Procedures (Signed)
PROCEDURE SUMMARY:  Successful image-guided left thoracentesis. Yielded 1.0 liters of golden yellow fluid. Patient tolerated procedure well. EBL: Zero No immediate complications.  Specimen was sent for labs.  Patient to undergo CT chest post procedure in lieu of CXR per ordering physician.  Please see imaging section of Epic for full dictation.  Villa Herb PA-C 09/14/2019 9:09 AM

## 2019-09-14 NOTE — Progress Notes (Signed)
Spoke with pt and notified of results per Dr. Jayme Cloud. Pt verbalized understanding and denied any questions.  Urgent referral sent to [facility] and ECHO ordered

## 2019-09-15 ENCOUNTER — Ambulatory Visit
Admission: RE | Admit: 2019-09-15 | Discharge: 2019-09-15 | Disposition: A | Discharge status: Home / Self Care | Payer: Medicare Other | Source: Ambulatory Visit | Attending: Pulmonary Disease | Admitting: Pulmonary Disease

## 2019-09-15 ENCOUNTER — Telehealth: Payer: Self-pay | Admitting: Pulmonary Disease

## 2019-09-15 ENCOUNTER — Ambulatory Visit (INDEPENDENT_AMBULATORY_CARE_PROVIDER_SITE_OTHER): Payer: Medicare Other

## 2019-09-15 ENCOUNTER — Ambulatory Visit
Admission: RE | Admit: 2019-09-15 | Discharge: 2019-09-15 | Disposition: A | Discharge status: Home / Self Care | Payer: Medicare Other | Attending: Pulmonary Disease | Admitting: Pulmonary Disease

## 2019-09-15 DIAGNOSIS — J90 Pleural effusion, not elsewhere classified: Secondary | ICD-10-CM | POA: Insufficient documentation

## 2019-09-15 DIAGNOSIS — I319 Disease of pericardium, unspecified: Secondary | ICD-10-CM | POA: Diagnosis not present

## 2019-09-15 DIAGNOSIS — R21 Rash and other nonspecific skin eruption: Secondary | ICD-10-CM | POA: Diagnosis not present

## 2019-09-15 LAB — ECHOCARDIOGRAM COMPLETE
AR max vel: 2.9 cm2
AV Area VTI: 2.54 cm2
AV Area mean vel: 2.84 cm2
AV Mean grad: 4 mmHg
AV Peak grad: 8.8 mmHg
Ao pk vel: 1.48 m/s
Area-P 1/2: 2.61 cm2
Calc EF: 57.5 %
S' Lateral: 3.4 cm
Single Plane A2C EF: 60.9 %
Single Plane A4C EF: 58.6 %

## 2019-09-15 LAB — CYTOLOGY - NON PAP

## 2019-09-15 NOTE — Telephone Encounter (Signed)
Called and spoke to pt. Pt states since her Thoracentesis yesterday, she had developed left sided pain that worsens when laying flat and pain when taking a deep breath. I have spoken to Dr. Jayme Cloud verbally, who recommends a CXR.  Pt is aware of recommendations and voiced her understanding.  CXR has been ordered. Nothing further is needed at this time.

## 2019-09-15 NOTE — Telephone Encounter (Signed)
Please see duplicate message

## 2019-09-15 NOTE — Telephone Encounter (Signed)
Left message with pt's husband requesting that pt return our call.

## 2019-09-16 ENCOUNTER — Ambulatory Visit (INDEPENDENT_AMBULATORY_CARE_PROVIDER_SITE_OTHER): Payer: Medicare Other | Admitting: Cardiovascular Disease

## 2019-09-16 ENCOUNTER — Encounter: Payer: Self-pay | Admitting: Cardiovascular Disease

## 2019-09-16 ENCOUNTER — Other Ambulatory Visit: Payer: Self-pay

## 2019-09-16 VITALS — BP 106/62 | HR 100 | Ht 67.0 in | Wt 246.0 lb

## 2019-09-16 DIAGNOSIS — R0602 Shortness of breath: Secondary | ICD-10-CM | POA: Diagnosis not present

## 2019-09-16 DIAGNOSIS — I313 Pericardial effusion (noninflammatory): Secondary | ICD-10-CM

## 2019-09-16 DIAGNOSIS — I3139 Other pericardial effusion (noninflammatory): Secondary | ICD-10-CM

## 2019-09-16 MED ORDER — COLCHICINE 0.6 MG PO TABS
0.6000 mg | ORAL_TABLET | Freq: Two times a day (BID) | ORAL | 1 refills | Status: DC
Start: 2019-09-16 — End: 2019-09-24

## 2019-09-16 NOTE — Progress Notes (Signed)
Cardiology Office Note  Date:  09/16/2019   ID:  Ann Peters, DOB 03-Nov-1950, MRN [id number]  PCP:  Ann Milan, MD   Cc: Shortness of breath, pleural effusion, pericardial effusion  HPI:  [name] is a 69 year old woman with past medical history of Recurrent left pleural effusion, lymphocytic predominant Consistent with parapneumonic effusion Completed Levaquin therapy for presumed left lower lobe pneumonia, no organism found Who presents by referral from Dr. Sarina Peters for pericardial effusion, recurrent pleural effusion  Had sinus surgery, 03/2019 Still with sinus drainage  Worsening shortness of breath, x-ray in July 2021 Showing moderate left pleural effusion occupying lower one third of left hemithorax compressive atelectasis  Had thoracentesis on the left September 02, 2019 800 out Follow-up x-ray September 02, 2019 no significant effusion  Repeat x-ray 1 week later September 09, 2019, reaccumulation of her effusion on the left Successful thoracentesis on the left 1 L out  CT scan September 14, 2019 showing moderate sized pericardial effusion Probable left posterior basilar pneumonia also noted  Echocardiogram from yesterday reviewed on today's visit  showed moderate pericardial effusion, no hemodynamic compromise noted  Lab work reviewed, creatinine 1.2 Thoracentesis fluid/pleural fluid examined showing no malignancy, predominantly chronic inflammatory cells  Repeat x-ray done within the past day or so shows recurrent pleural effusion on the left  On today's visit she has left flank pain, taking ibuprofen for pain relief  EKG personally reviewed by myself on todays visit Shows normal sinus rhythm rate 99 bpm low voltage no significant ST-T wave changes    PMH:   has a past medical history of Allergy, Anxiety, Depression, History of shingles, Neuromuscular disorder (HCC), Pneumonia, Post herpetic neuralgia, Scoliosis, Shingles, and Sinusitis (10/18/2016).  PSH:     Past Surgical History:  Procedure Laterality Date  . BLADDER REPAIR    . COLON SURGERY  2011   10 inches removed for benign mass, does not absorb nutrients as well  . COLONOSCOPY  2011   polyp removed- Dr Ann Peters  . COLONOSCOPY WITH PROPOFOL N/A 07/04/2017   Procedure: COLONOSCOPY WITH PROPOFOL;  Surgeon: Ann Minium, MD;  Location: [facility];  Service: Endoscopy;  Laterality: N/A;  recurrent shingles  . NASAL SINUS SURGERY    . TONSILLECTOMY    . TOTAL HIP ARTHROPLASTY Left 10/10/2015   Procedure: LEFT TOTAL HIP ARTHROPLASTY ANTERIOR APPROACH;  Surgeon: Ann Hitch, MD;  Location: [facility];  Service: Orthopedics;  Laterality: Left;  . TRIGGER FINGER RELEASE Left 03/28/2017   Procedure: MIDDLE FINGER DUPREYTRENS RELEASE;  Surgeon: Ann Sons, MD;  Location: [facility];  Service: Orthopedics;  Laterality: Left;  . TUBAL LIGATION    . TURBINATE REDUCTION Bilateral 04/29/2019   Procedure: TURBINATE REDUCTION WITH VIVEAR;  Surgeon: Ann Murders, MD;  Location: [facility];  Service: ENT;  Laterality: Bilateral;  vivear  . VAGINAL HYSTERECTOMY      Current Outpatient Medications  Medication Sig Dispense Refill  . benzonatate (TESSALON) 200 MG capsule Take 1 capsule (200 mg total) by mouth 3 (three) times daily as needed for cough. 30 capsule 0  . chlorpheniramine-HYDROcodone (TUSSIONEX PENNKINETIC ER) 10-8 MG/5ML SUER Take 5 mLs by mouth every 12 (twelve) hours as needed for cough. 60 mL 0  . Cholecalciferol (VITAMIN D3) 5000 UNITS CAPS Take 1 capsule (5,000 Units total) by mouth daily. 30 capsule   . Cyanocobalamin (VITAMIN B-12) 5000 MCG SUBL Place 15,000 mcg under the tongue daily.    . DULoxetine (CYMBALTA) 60  MG capsule TAKE 2 CAPSULES BY MOUTH DAILY 180 capsule 3  . fluticasone (FLONASE) 50 MCG/ACT nasal spray Place 2 sprays into both nostrils daily. 16 g 0  . Melatonin 1 MG TABS Take by mouth.    . Multiple Vitamins-Minerals (MULTIVITAMIN WOMEN  50+) TABS Take 1 tablet by mouth daily.    . sodium chloride (OCEAN) 0.65 % SOLN nasal spray Place 1 spray into both nostrils as needed for congestion.    . traZODone (DESYREL) 100 MG tablet Take 2 tablets (200 mg total) by mouth at bedtime. 180 tablet 1  . valACYclovir (VALTREX) 1000 MG tablet TAKE 1 TABLET BY MOUTH EVERY DAY 90 tablet 0  . colchicine 0.6 MG tablet Take 1 tablet (0.6 mg total) by mouth 2 (two) times daily. 60 tablet 1   No current facility-administered medications for this visit.     Allergies:   Amoxicillin-pot clavulanate, Beef-derived products, Pegademase bovine, and Penicillins   Social History:  The patient  reports that she has never smoked. She has never used smokeless tobacco. She reports that she does not drink alcohol and does not use drugs.   Family History:   family history includes Brain cancer in her sister; Breast cancer (age of onset: 42) in her sister; CAD in her father; Lymphoma in her mother.    Review of Systems: Review of Systems  Constitutional: Negative.   HENT: Negative.   Respiratory: Positive for shortness of breath.   Cardiovascular: Negative.   Gastrointestinal: Negative.   Musculoskeletal: Negative.        Left flank pain  Neurological: Negative.   Psychiatric/Behavioral: Negative.   All other systems reviewed and are negative.    PHYSICAL EXAM: VS:  BP (!) 106/62   Pulse 100   Ht 5\' 7"  (1.702 m)   Wt (!) 246 lb (111.6 kg)   SpO2 95%   BMI 38.53 kg/m  , BMI Body mass index is 38.53 kg/m. GEN: Well nourished, well developed, in no acute distress HEENT: normal Neck: no JVD, carotid bruits, or masses Cardiac: RRR; no murmurs, rubs, or gallops,no edema  Respiratory: Clear with scattered Rales, dullness at the left base one third of the way up GI: soft, nontender, nondistended, + BS MS: no deformity or atrophy Skin: warm and dry, no rash Neuro:  Strength and sensation are intact Psych: euthymic mood, full affect   Recent  Labs: 11/17/2018: TSH 3.710 06/07/2019: ALT 13; Hemoglobin 12.8; Platelets 313 09/13/2019: BUN 15; Potassium 4.0; Sodium 139 09/14/2019: Creatinine, Peters 1.20    Lipid Panel Lab Results  Component Value Date   CHOL 173 11/19/2013   HDL 51 11/19/2013   LDLCALC 100 11/19/2013   TRIG 111 11/19/2013      Wt Readings from Last 3 Encounters:  09/16/19 (!) 246 lb (111.6 kg)  09/09/19 (!) 246 lb 3.2 oz (111.7 kg)  09/02/19 250 lb 9.6 oz (113.7 kg)      ASSESSMENT AND PLAN:  Problem List Items Addressed This Visit    None    Visit Diagnoses    Pericardial effusion    -  Primary   Relevant Orders   EKG 12-Lead   09/04/19 THORACENTESIS ASP PLEURAL SPACE W/IMG GUIDE   Rheumatoid Factor   Antinuclear Antib (ANA)   C-reactive protein   Sedimentation rate   Shortness of breath       Relevant Orders   EKG 12-Lead   US THORACENTESIS ASP PLEURAL SPACE W/IMG GUIDE   Rheumatoid Factor   Antinuclear  Antib (ANA)   C-reactive protein   Sedimentation rate     Recurrent pleural effusion Followed by pulmonary She has had thoracentesis x2, quick recurrence Pathology showing lymphocytic predominance Has been treated for pneumonia with Levaquin -Given recurrence of her effusion in the past several days, flank pain, we have discussed the case with pulmonary, Dr. Jayme Cloud She has recommended setting up for repeat thoracentesis by ultrasound -We have called IR, this has been arranged for Monday  Pericardial effusion Likely related to pathology as detailed above, her pleural effusion Unable to exclude a parapneumonic effusion with associated pericardial effusion -We will treat with colchicine 0.6 mg twice daily, ibuprofen every 8 hours -Lab work has been arranged for today including CRP, sed rate, rheumatologic factor, ANA  Long discussion with patient and her husband who presents with her today concerning etiology of her symptoms She is concerned about prior dental procedure February 2021 with  slow healing, Concerned that her pneumonia could be from that procedure and recovery  Disposition:   F/U in 4 weeks  Very complicated case, discussed with Dr. Jayme Cloud on today's visit Lab work ordered, we have called interventional radiology to schedule thoracentesis, medications sent in  Total encounter time more than 60 minutes  Greater than 50% was spent in counseling and coordination of care with the patient    Signed, Dossie Arbour, M.D., Ph.D. [facility] [place], [place] [phone]

## 2019-09-16 NOTE — Patient Instructions (Addendum)
Medication Instructions:  1. START Colchicine 0.6 mg twice a day  2. TAKE Ibuprofen 600 mg every 8 hours (Over the counter) 3. TAKE Pepcid Twice a day (Over the counter)   If you need a refill on your cardiac medications before your next appointment, please call your pharmacy.    Lab work: Sed rate and CRP, ANA, Rheumatoid Factor   If you have labs (blood work) drawn today and your tests are completely normal, you will receive your results only by: Marland Kitchen MyChart Message (if you have MyChart) OR . A paper copy in the mail If you have any lab test that is abnormal or we need to change your treatment, we will call you to review the results.   Testing/Procedures: Ultrasound Thoracentesis Scheduled for Monday August 2 nd  Arrive at 2:00 PM for your procedure    Follow-Up: At [facility], you and your health needs are our priority.  As part of our continuing mission to provide you with exceptional heart care, we have created designated Provider Care Teams.  These Care Teams include your primary Cardiologist (physician) and Advanced Practice Providers (APPs -  Physician Assistants and Nurse Practitioners) who all work together to provide you with the care you need, when you need it.  . You will need a follow up appointment  1 month Dr. Mariah Milling or APP  . Providers on your designated Care Team:   . Nicolasa Ducking, NP . Eula Listen, PA-C . Marisue Ivan, PA-C  Any Other Special Instructions Will Be Listed Below (If Applicable).  COVID-19 Vaccine Information can be found at: [email or web address] For questions related to vaccine distribution or appointments, please email [email or web address] or call [phone].

## 2019-09-17 ENCOUNTER — Telehealth: Payer: Self-pay | Admitting: Cardiovascular Disease

## 2019-09-17 LAB — RHEUMATOID FACTOR: Rheumatoid fact SerPl-aCnc: <10 IU/mL (ref 0.0–13.9)

## 2019-09-17 LAB — SEDIMENTATION RATE: Sed Rate: 49 mm/hr — ABNORMAL HIGH (ref 0–40)

## 2019-09-17 LAB — ANA: Anti Nuclear Antibody (ANA): NEGATIVE

## 2019-09-17 LAB — C-REACTIVE PROTEIN: CRP: 19 mg/L — ABNORMAL HIGH (ref 0–10)

## 2019-09-17 NOTE — Telephone Encounter (Signed)
Spoke with patient and reviewed that due to increased COVID cases the procedure guidelines have changed. They no longer will accept just vaccine card and will require her to have a Rapid COVID swab done earlier that day. Informed her to please arrive at 07:30 AM to medical mall to have that done prior to her procedure later that day. She verbalized understanding of requirements, agreeable with plan, and no further questions.

## 2019-09-20 ENCOUNTER — Ambulatory Visit: Payer: Medicare Other

## 2019-09-22 ENCOUNTER — Telehealth: Payer: Self-pay | Admitting: Pulmonary Disease

## 2019-09-22 ENCOUNTER — Telehealth: Payer: Self-pay | Admitting: Cardiovascular Disease

## 2019-09-22 DIAGNOSIS — J90 Pleural effusion, not elsewhere classified: Secondary | ICD-10-CM

## 2019-09-22 MED ORDER — PREDNISONE 10 MG (21) PO TBPK
ORAL_TABLET | ORAL | 0 refills | Status: DC
Start: 2019-09-22 — End: 2019-09-24

## 2019-09-22 NOTE — Telephone Encounter (Signed)
Pt is aware of below message/recommendations and voiced her understanding.  CXR has been ordered. Prednisone has been sent to preferred pharmacy.  Nothing further is needed.

## 2019-09-22 NOTE — Telephone Encounter (Signed)
Left voicemail message to call back.   Calling to see how she is doing and confirm if she had her procedure.

## 2019-09-22 NOTE — Telephone Encounter (Signed)
Called and spoke to pt.  Pt feels that she is having side effects from colchicine. Pt reports of diarrhea. I have recommended that she contact Dr. Mariah Milling regarding this. Pt also reports of lung pain with deep breathing and mild sob. Pt is concerned about recurrent pleural effusion. She would like an CXR.  Dr. Jayme Cloud, please advise.

## 2019-09-22 NOTE — Telephone Encounter (Signed)
Recommend stopping the colchicine for now.  We will start her on some prednisone.  We can order a chest x-ray for tomorrow.  What happened to the repeat thoracentesis that was requested by Dr. Mariah Milling?  If the has x-ray tomorrow shows recurrent fluid I will have to refer her to Dr. Thelma Barge for VATS (surgery).  She can have a prednisone taper called in number 21 tablets of 10 mg.

## 2019-09-23 ENCOUNTER — Other Ambulatory Visit: Payer: Self-pay

## 2019-09-23 ENCOUNTER — Ambulatory Visit
Admission: RE | Admit: 2019-09-23 | Discharge: 2019-09-23 | Disposition: A | Discharge status: Home / Self Care | Payer: Medicare Other | Source: Ambulatory Visit | Attending: Pulmonary Disease | Admitting: Pulmonary Disease

## 2019-09-23 DIAGNOSIS — J90 Pleural effusion, not elsewhere classified: Secondary | ICD-10-CM

## 2019-09-23 NOTE — Progress Notes (Signed)
cxr 

## 2019-09-24 ENCOUNTER — Ambulatory Visit (INDEPENDENT_AMBULATORY_CARE_PROVIDER_SITE_OTHER): Payer: Medicare Other | Admitting: Cardiothoracic Surgery

## 2019-09-24 ENCOUNTER — Other Ambulatory Visit: Payer: Self-pay

## 2019-09-24 ENCOUNTER — Telehealth: Payer: Self-pay | Admitting: Pulmonary Disease

## 2019-09-24 ENCOUNTER — Encounter: Payer: Self-pay | Admitting: Cardiothoracic Surgery

## 2019-09-24 VITALS — Temp 98.1°F | Ht 67.0 in | Wt 243.0 lb

## 2019-09-24 DIAGNOSIS — J90 Pleural effusion, not elsewhere classified: Secondary | ICD-10-CM

## 2019-09-24 NOTE — Telephone Encounter (Addendum)
Dr. Jayme Cloud is currently in the process of opening up morning clinic on 10/05/2019. Eber Jones with Dr. Thelma Barge office is aware that I will contact patient to schedule OV once schedule is open.  Will leave encounter open to ensure f/u.

## 2019-09-24 NOTE — Progress Notes (Signed)
Patient ID: Ann Peters, female   DOB: Aug 13, 1950, 69 y.o.   MRN: [id number]  Chief Complaint  Patient presents with  . New Patient (Initial Visit)    Pleural Effusion    Referred By Dr. Aleen Campi Reason for Referral recurrent left pleural effusion with pericardial effusion  HPI Location, Quality, Duration, Severity, Timing, Context, Modifying Factors, Associated Signs and Symptoms.  KEAMBER Peters is a 69 y.o. female.  Her problems began a couple weeks ago when she experienced the somewhat acute onset of shortness of breath.  She presented to an urgent care facility and was diagnosed with pneumonia after chest x-ray showed a left lower lobe infiltrate and a pleural effusion.  She was treated with Levaquin and then had a ultrasound-guided thoracentesis.  The results of that were mostly suggestive of a exudative process but no evidence of infection or malignancy.  The fluid analysis mostly suggestive of lymphocytic exudate.  The patient had another thoracentesis with similar findings 2 weeks later and at that point had a CT scan.  The CT scan showed a moderate pericardial effusion as well as a persistent left lower lobe pneumonia.  She had an echocardiogram which did not reveal any evidence of increased intracardiac pressures.  Her cardiac exam was normal.  She comes in today to discuss treatment options.  Dr. Jayme Cloud had started the patient on prednisone.  She only took 1 dose and thought that it made her stomach upset so she did not complete that.  She has completed her antibiotics.  She is not a smoker and has no prior history of malignancy.  She did have some nasal surgery several months ago and may have had some aspiration related to that surgery.  The details of that are unclear at this point.  She does have a history of shingles and takes valacyclovir for that.   Past Medical History:  Diagnosis Date  . Allergy   . Anxiety   . Depression   . History of shingles    ON AND OFF 6 YEARS,  recurrent episodes of internal and external shingles with resulting neuralgia  . Neuromuscular disorder (HCC)    POSTHERPETIC NEURALGIA, left wrist weakness  . Pneumonia    HX   SEVERAL TIMES  NONE IN 3 YRS  . Post herpetic neuralgia   . Scoliosis   . Shingles    history of.  Recurrent  . Sinusitis 10/18/2016    Past Surgical History:  Procedure Laterality Date  . BLADDER REPAIR    . COLON SURGERY  2011   10 inches removed for benign mass, does not absorb nutrients as well  . COLONOSCOPY  2011   polyp removed- Dr Marva Panda  . COLONOSCOPY WITH PROPOFOL N/A 07/04/2017   Procedure: COLONOSCOPY WITH PROPOFOL;  Surgeon: Midge Minium, MD;  Location: [facility];  Service: Endoscopy;  Laterality: N/A;  recurrent shingles  . NASAL SINUS SURGERY    . TONSILLECTOMY    . TOTAL HIP ARTHROPLASTY Left 10/10/2015   Procedure: LEFT TOTAL HIP ARTHROPLASTY ANTERIOR APPROACH;  Surgeon: Kathryne Hitch, MD;  Location: [facility];  Service: Orthopedics;  Laterality: Left;  . TRIGGER FINGER RELEASE Left 03/28/2017   Procedure: MIDDLE FINGER DUPREYTRENS RELEASE;  Surgeon: Erin Sons, MD;  Location: [facility];  Service: Orthopedics;  Laterality: Left;  . TUBAL LIGATION    . TURBINATE REDUCTION Bilateral 04/29/2019   Procedure: TURBINATE REDUCTION WITH VIVEAR;  Surgeon: Vernie Murders, MD;  Location: [facility];  Service:  ENT;  Laterality: Bilateral;  vivear  . VAGINAL HYSTERECTOMY     partial    Family History  Problem Relation Age of Onset  . Lymphoma Mother   . Sarcoidosis Mother   . CAD Father   . Breast cancer Sister 47  . Brain cancer Sister     Social History Social History   Tobacco Use  . Smoking status: Never Smoker  . Smokeless tobacco: Never Used  Vaping Use  . Vaping Use: Never used  Substance Use Topics  . Alcohol use: No    Alcohol/week: 0.0 standard drinks  . Drug use: No    Allergies  Allergen Reactions  . Amoxicillin-Pot Clavulanate  Nausea And Vomiting and Other (See Comments)    Other reaction(s): Vomiting  . Beef-Derived Products Nausea And Vomiting  . Pegademase Bovine Nausea And Vomiting  . Penicillins Nausea And Vomiting and Other (See Comments)    Has patient had a PCN reaction causing immediate rash, facial/tongue/throat swelling, SOB or lightheadedness with hypotension: no Has patient had a PCN reaction causing severe rash involving mucus membranes or skin necrosis: No Has patient had a PCN reaction that required hospitalization No Has patient had a PCN reaction occurring within the last 10 years: Yes If all of the above answers are "NO", then may proceed with Cephalosporin use.         Current Outpatient Medications  Medication Sig Dispense Refill  . Cholecalciferol (VITAMIN D3) 5000 UNITS CAPS Take 1 capsule (5,000 Units total) by mouth daily. 30 capsule   . Cyanocobalamin (VITAMIN B-12) 5000 MCG SUBL Place 15,000 mcg under the tongue daily.    . DULoxetine (CYMBALTA) 60 MG capsule TAKE 2 CAPSULES BY MOUTH DAILY 180 capsule 3  . fluticasone (FLONASE) 50 MCG/ACT nasal spray Place 2 sprays into both nostrils daily. 16 g 0  . Melatonin 1 MG TABS Take by mouth.    . Multiple Vitamins-Minerals (MULTIVITAMIN WOMEN 50+) TABS Take 1 tablet by mouth daily.    . sodium chloride (OCEAN) 0.65 % SOLN nasal spray Place 1 spray into both nostrils as needed for congestion.    . traZODone (DESYREL) 100 MG tablet Take 2 tablets (200 mg total) by mouth at bedtime. 180 tablet 1  . valACYclovir (VALTREX) 1000 MG tablet TAKE 1 TABLET BY MOUTH EVERY DAY 90 tablet 0   No current facility-administered medications for this visit.      Review of Systems A complete review of systems was asked and was negative except for the following positive findings shortness of breath, wheezing, fluid around her heart and lung  Temperature 98.1 F (36.7 C), height 5\' 7"  (1.702 m), weight 243 lb (110.2 kg), SpO2 95 %.  Physical  Exam CONSTITUTIONAL:  Pleasant, well-developed, well-nourished, and in no acute distress. EYES: Pupils equal and reactive to light, Sclera non-icteric EARS, NOSE, MOUTH AND THROAT:  The oropharynx was clear.  Dentition is good repair.  Oral mucosa pink and moist. LYMPH NODES:  Lymph nodes in the neck and axillae were normal RESPIRATORY:  Lungs were clear on the right and perhaps slightly diminished at the left base.  Normal respiratory effort without pathologic use of accessory muscles of respiration CARDIOVASCULAR: Heart was regular without murmurs.  There were no carotid bruits. GI: The abdomen was soft, nontender, and nondistended. There were no palpable masses. There was no hepatosplenomegaly. There were normal bowel sounds in all quadrants. GU:  Rectal deferred.   MUSCULOSKELETAL:  Normal muscle strength and tone.  No clubbing  or cyanosis.   SKIN:  There were no pathologic skin lesions.  There were no nodules on palpation. NEUROLOGIC:  Sensation is normal.  Cranial nerves are grossly intact. PSYCH:  Oriented to person, place and time.  Mood and affect are normal.  Data Reviewed CT scans and chest x-rays  I have personally reviewed the patient's imaging, laboratory findings and medical records.    Assessment    Pleural and pericardial effusions in the setting of a left lower lobe pneumonia    Plan    I have reviewed the chest CT and the chest x-rays with the patient.  Although it would be tempting to explain both of her problems with one simple diagnosis of Avalos cyst explained both of these at this time.  An acute inflammatory lower lobe pneumonia would likely result in a different fluid analysis.  There does not appear to be a malignancy associated with the left lower lobe although that certainly may be possible as well.  However the fluid cytology was negative except.  I reviewed with her the options including pericardial drainage by either echo guidance for open pericardial window.   In addition I discussed with her the option of thoracoscopy with talc pleurodesis.  Alternatively she could complete her course of prednisone and then follow-up with another chest x-ray.  At the present time she would like to pursue that approach and we have recommended that she continue her prednisone along with her probiotics and follow-up with Dr. Jayme Cloud in 2 weeks.       Hulda Marin, MD 09/24/2019, 10:12 AM

## 2019-09-24 NOTE — Telephone Encounter (Signed)
Pt has been scheduled for OV on 8/17/2021at 8:30.  She is aware and voiced her understanding.  Nothing further is needed.

## 2019-09-24 NOTE — Patient Instructions (Addendum)
Try to continue your Prednisone prescription.  We have called Dr Jayme Cloud office, they will get you a follow up with Dr Jayme Cloud in 2 weeks. They will contact you about this.   We will talk to Dr Jayme Cloud and Dr Mariah Milling and let them know our recommendations.

## 2019-09-24 NOTE — Telephone Encounter (Signed)
Spoke with patient to see how she is doing. She states that she had cancelled her procedure because Dr. Mariah Milling had told her to hold off on it for a while. She did see Dr. Thelma Barge this morning and that he was going to email providers about a plan for her. She was agreeable to do whatever is needed. Will update Dr. Mariah Milling as well.

## 2019-09-24 NOTE — Telephone Encounter (Signed)
Left voicemail message to call back  

## 2019-09-24 NOTE — Telephone Encounter (Signed)
Patient says she is doing ok

## 2019-09-25 ENCOUNTER — Encounter: Payer: Self-pay | Admitting: Cardiothoracic Surgery

## 2019-09-27 ENCOUNTER — Other Ambulatory Visit: Payer: Self-pay

## 2019-09-27 DIAGNOSIS — J90 Pleural effusion, not elsewhere classified: Secondary | ICD-10-CM

## 2019-09-27 NOTE — Telephone Encounter (Signed)
Routing to Dr. Gonzalez as an FYI 

## 2019-09-27 NOTE — Telephone Encounter (Signed)
Continue prednisone.  We will obtain chest x-ray on her follow-up visit day.

## 2019-10-04 ENCOUNTER — Ambulatory Visit
Admission: RE | Admit: 2019-10-04 | Discharge: 2019-10-04 | Disposition: A | Discharge status: Home / Self Care | Payer: Medicare Other | Attending: Pulmonary Disease | Admitting: Pulmonary Disease

## 2019-10-04 ENCOUNTER — Ambulatory Visit
Admission: RE | Admit: 2019-10-04 | Discharge: 2019-10-04 | Disposition: A | Discharge status: Home / Self Care | Payer: Medicare Other | Source: Ambulatory Visit | Attending: Pulmonary Disease | Admitting: Pulmonary Disease

## 2019-10-04 ENCOUNTER — Other Ambulatory Visit: Payer: Self-pay

## 2019-10-04 DIAGNOSIS — J90 Pleural effusion, not elsewhere classified: Secondary | ICD-10-CM | POA: Insufficient documentation

## 2019-10-04 DIAGNOSIS — J9811 Atelectasis: Secondary | ICD-10-CM | POA: Diagnosis not present

## 2019-10-04 DIAGNOSIS — R079 Chest pain, unspecified: Secondary | ICD-10-CM | POA: Diagnosis not present

## 2019-10-05 ENCOUNTER — Encounter: Payer: Self-pay | Admitting: Pulmonary Disease

## 2019-10-05 ENCOUNTER — Ambulatory Visit: Payer: Medicare Other | Admitting: Pulmonary Disease

## 2019-10-05 VITALS — BP 120/80 | HR 96 | Temp 97.3°F | Ht 67.0 in | Wt 244.0 lb

## 2019-10-05 DIAGNOSIS — J90 Pleural effusion, not elsewhere classified: Secondary | ICD-10-CM

## 2019-10-05 DIAGNOSIS — I319 Disease of pericardium, unspecified: Secondary | ICD-10-CM

## 2019-10-05 LAB — FUNGUS CULTURE WITH STAIN

## 2019-10-05 LAB — FUNGAL ORGANISM REFLEX

## 2019-10-05 LAB — FUNGUS CULTURE RESULT

## 2019-10-05 MED ORDER — PREDNISONE 10 MG PO TABS: ORAL_TABLET | ORAL | 0 refills | Status: DC

## 2019-10-05 NOTE — Patient Instructions (Signed)
We are going to send another prescription of prednisone to your pharmacy.  You will start with 2 tablets daily for 5 days (20 mg) then 1 tablet daily until seen again that is 10 mg.  See you in follow-up in 2 to 3 weeks with a chest x-ray prior to that time.  We will see me or one of our nurse practitioners.  The meantime I am going to discuss your case again with Dr. Thelma Barge (thoracic surgeon)

## 2019-10-05 NOTE — Progress Notes (Signed)
    Assessment & Plan:  1. Pleuropericarditis (Primary)  2. Pleural effusion on left - DG Chest 2 View; Future  3. Pleural effusion   Patient Instructions  We are going to send another prescription of prednisone  to your pharmacy.  You will start with 2 tablets daily for 5 days (20 mg) then 1 tablet daily until seen again that is 10 mg.  See you in follow-up in 2 to 3 weeks with a chest x-ray prior to that time.  We will see me or one of our nurse practitioners.  The meantime I am going to discuss your case again with Dr. Volney (thoracic surgeon)  Please note: late entry documentation due to logistical difficulties during COVID-19 pandemic. This note is filed for information purposes only, and is not intended to be used for billing, nor does it represent the full scope/nature of the visit in question. Please see any associated scanned media linked to date of encounter for additional pertinent information.  Subjective:    HPI: Ann Peters is a 69 y.o. female presenting to the pulmonology clinic on 10/05/2019 with report of: Follow-up (CXR done, states fluid build up again.  cough last night, patient does not recall.  sob improved.)     Outpatient Encounter Medications as of 10/05/2019  Medication Sig   Multiple Vitamins-Minerals (MULTIVITAMIN WOMEN 50+) TABS Take 1 tablet by mouth daily.   sodium chloride  (OCEAN) 0.65 % SOLN nasal spray Place 1 spray into both nostrils as needed for congestion.   [DISCONTINUED] Cholecalciferol  (VITAMIN D3) 5000 UNITS CAPS Take 1 capsule (5,000 Units total) by mouth daily. (Patient not taking: Reported on 01/02/2023)   [DISCONTINUED] Cyanocobalamin  (VITAMIN B-12) 5000 MCG SUBL Place 15,000 mcg under the tongue daily.   [DISCONTINUED] DULoxetine  (CYMBALTA ) 60 MG capsule TAKE 2 CAPSULES BY MOUTH DAILY   [DISCONTINUED] fluticasone  (FLONASE ) 50 MCG/ACT nasal spray Place 2 sprays into both nostrils daily. (Patient taking differently: Place 2 sprays into  both nostrils daily as needed for allergies.)   [DISCONTINUED] Melatonin 1 MG TABS Take by mouth. (Patient not taking: Reported on 11/10/2019)   [DISCONTINUED] traZODone  (DESYREL ) 100 MG tablet Take 2 tablets (200 mg total) by mouth at bedtime.   [DISCONTINUED] valACYclovir  (VALTREX ) 1000 MG tablet TAKE 1 TABLET BY MOUTH EVERY DAY (Patient taking differently: as needed.)   [DISCONTINUED] predniSONE  (DELTASONE ) 10 MG tablet 2 tablets daily for 5 days then 1 tablet daily until seen again.   No facility-administered encounter medications on file as of 10/05/2019.      Objective:   Vitals:   10/05/19 0826  BP: 120/80  Pulse: 96  Temp: (!) 97.3 F (36.3 C)  Height: 5' 7 (1.702 m)  Weight: 244 lb (110.7 kg)  SpO2: 97%  TempSrc: Temporal  BMI (Calculated): 38.21     Physical exam documentation is limited by delayed entry of information.

## 2019-10-07 NOTE — Progress Notes (Signed)
Cardiology Office Note  Date:  10/08/2019   ID:  Ann Peters, DOB 03-09-1950, MRN [id number]  PCP:  Glean Hess, MD   Chief Complaint  Patient presents with  . Other    1 month follow up. Patient c/o SOB and chest pain. Meds reviewed verbally with patient.      HPI:  [name] is a 69 year old woman with past medical history of Recurrent left pleural effusion, lymphocytic predominant Consistent with parapneumonic effusion Completed Levaquin therapy for presumed left lower lobe pneumonia, no organism found Who presents for follow-up of her pericardial effusion, recurrent pleural effusion  Had sinus surgery, 03/2019 Still with sinus drainage  Worsening shortness of breath, x-ray in July 2021 Showing moderate left pleural effusion occupying lower one third of left hemithorax compressive atelectasis  Had thoracentesis on the left September 02, 2019 800 out Follow-up x-ray September 02, 2019 no significant effusion  Repeat x-ray 1 week later September 09, 2019, reaccumulation of her effusion on the left Successful thoracentesis on the left 1 L out  CT scan September 14, 2019 showing moderate sized pericardial effusion Probable left posterior basilar pneumonia also noted  Echocardiogram from yesterday reviewed on today's visit  showed moderate pericardial effusion, no hemodynamic compromise noted  Lab work reviewed, creatinine 1.2 Thoracentesis fluid/pleural fluid examined showing no malignancy, predominantly chronic inflammatory cells  When last seen in clinic September 16, 2019, we recommended she start NSAIDs and colchicine after discussion with pulmonary She had intolerance to colchicine Was placed back on prednisone  Recent x-ray October 04, 2019, persistent left pleural effusion and basilar atelectasis  Prior lab work reviewed  sed rate 49,  3 weeks ago CRP 19  She has met with Dr. Faith Rogue, she is not eager to proceed with pleurodesis or pericardial window at this time as she is  feeling better on prednisone Able to lay on her left side without as much pain X-ray pulled up and reviewed in the room  Also pulled up prior CT scan reviewed aorta, very mild aortic atherosclerosis with no significant coronary calcification noted  EKG personally reviewed by myself on todays visit Shows normal sinus rhythm rate 67 bpm nonspecific ST abnormality   PMH:   has a past medical history of Allergy, Anxiety, Depression, History of shingles, Neuromuscular disorder ([place]), Pneumonia, Post herpetic neuralgia, Scoliosis, Shingles, and Sinusitis (10/18/2016).  PSH:    Past Surgical History:  Procedure Laterality Date  . BLADDER REPAIR    . COLON SURGERY  2011   10 inches removed for benign mass, does not absorb nutrients as well  . COLONOSCOPY  2011   polyp removed- Dr Gustavo Lah  . COLONOSCOPY WITH PROPOFOL N/A 07/04/2017   Procedure: COLONOSCOPY WITH PROPOFOL;  Surgeon: Lucilla Lame, MD;  Location: [place];  Service: Endoscopy;  Laterality: N/A;  recurrent shingles  . NASAL SINUS SURGERY    . TONSILLECTOMY    . TOTAL HIP ARTHROPLASTY Left 10/10/2015   Procedure: LEFT TOTAL HIP ARTHROPLASTY ANTERIOR APPROACH;  Surgeon: Mcarthur Rossetti, MD;  Location: [place];  Service: Orthopedics;  Laterality: Left;  . TRIGGER FINGER RELEASE Left 03/28/2017   Procedure: MIDDLE FINGER DUPREYTRENS RELEASE;  Surgeon: Leanor Kail, MD;  Location: [place];  Service: Orthopedics;  Laterality: Left;  . TUBAL LIGATION    . TURBINATE REDUCTION Bilateral 04/29/2019   Procedure: TURBINATE REDUCTION WITH VIVEAR;  Surgeon: Margaretha Sheffield, MD;  Location: [place];  Service: ENT;  Laterality: Bilateral;  vivear  .  VAGINAL HYSTERECTOMY     partial    Current Outpatient Medications  Medication Sig Dispense Refill  . Cholecalciferol (VITAMIN D3) 5000 UNITS CAPS Take 1 capsule (5,000 Units total) by mouth daily. 30 capsule   . Cyanocobalamin (VITAMIN B-12) 5000 MCG SUBL  Place 15,000 mcg under the tongue daily.    . DULoxetine (CYMBALTA) 60 MG capsule TAKE 2 CAPSULES BY MOUTH DAILY 180 capsule 3  . fluticasone (FLONASE) 50 MCG/ACT nasal spray Place 2 sprays into both nostrils daily. 16 g 0  . Melatonin 1 MG TABS Take by mouth.    . Multiple Vitamins-Minerals (MULTIVITAMIN WOMEN 50+) TABS Take 1 tablet by mouth daily.    . predniSONE (DELTASONE) 10 MG tablet 2 tablets daily for 5 days then 1 tablet daily until seen again. 45 tablet 0  . sodium chloride (OCEAN) 0.65 % SOLN nasal spray Place 1 spray into both nostrils as needed for congestion.    . traZODone (DESYREL) 100 MG tablet Take 2 tablets (200 mg total) by mouth at bedtime. 180 tablet 1  . valACYclovir (VALTREX) 1000 MG tablet TAKE 1 TABLET BY MOUTH EVERY DAY 90 tablet 0   No current facility-administered medications for this visit.     Allergies:   Amoxicillin-pot clavulanate, Beef-derived products, Pegademase bovine, and Penicillins   Social History:  The patient  reports that she has never smoked. She has never used smokeless tobacco. She reports that she does not drink alcohol and does not use drugs.   Family History:   family history includes Brain cancer in her sister; Breast cancer (age of onset: 69) in her sister; CAD in her father; Lymphoma in her mother; Sarcoidosis in her mother.    Review of Systems: Review of Systems  Constitutional: Negative.   HENT: Negative.   Respiratory: Positive for shortness of breath.   Cardiovascular: Negative.   Gastrointestinal: Negative.   Musculoskeletal: Negative.        Left flank pain, improving  Neurological: Negative.   Psychiatric/Behavioral: Negative.   All other systems reviewed and are negative.   PHYSICAL EXAM: VS:  BP 120/80 (BP Location: Left Arm, Patient Position: Sitting, Cuff Size: Normal)   Pulse 67   Ht 5' 7"  (1.702 m)   Wt 243 lb (110.2 kg)   BMI 38.06 kg/m  , BMI Body mass index is 38.06 kg/m. Constitutional:  oriented to  person, place, and time. No distress.  HENT:  Head: Grossly normal Eyes:  no discharge. No scleral icterus.  Neck: No JVD, no carotid bruits  Cardiovascular: Regular rate and rhythm, no murmurs appreciated Pulmonary/Chest: Clear to auscultation bilaterally,  Dullness at the left base quarter of the way up Abdominal: Soft.  no distension.  no tenderness.  Musculoskeletal: Normal range of motion Neurological:  normal muscle tone. Coordination normal. No atrophy Skin: Skin warm and dry Psychiatric: normal affect, pleasant  Recent Labs: 11/17/2018: TSH 3.710 06/07/2019: ALT 13; Hemoglobin 12.8; Platelets 313 09/13/2019: BUN 15; Potassium 4.0; Sodium 139 09/14/2019: Creatinine, Ser 1.20    Lipid Panel Lab Results  Component Value Date   CHOL 173 11/19/2013   HDL 51 11/19/2013   LDLCALC 100 11/19/2013   TRIG 111 11/19/2013      Wt Readings from Last 3 Encounters:  10/08/19 243 lb (110.2 kg)  10/05/19 244 lb (110.7 kg)  09/24/19 243 lb (110.2 kg)      ASSESSMENT AND PLAN:  Problem List Items Addressed This Visit    None    Visit Diagnoses  Pericardial effusion    -  Primary   Shortness of breath       Pleural effusion         Recurrent pleural effusion Followed by pulmonary Currently on prednisone, has had improvement in her breathing, able to lay on her left side without as much pain -Previously with elevated CRP and sed rate Did not tolerate colchicine She has had thoracentesis x2,  Has not had repeat thoracentesis in 1 month She did complete antibiotics for pneumonia at the end of July 2021 -She is interested in possibly repeating thoracentesis as effusion has been stable and she has been under treatment with prednisone She will discuss with pulmonary  Pericardial effusion Moderate size, no tamponade on prior echocardiogram 3 weeks ago -Based on her symptoms today, very asymptomatic -No indication for urgent pericardiocentesis -Repeat echocardiogram was  offered She did not tolerate high-dose NSAIDs, colchicine  Recommend she follow-up as needed as he goes through her treatment Suggest she send [place] messages through my chart as she goes   Total encounter time more than 25 minutes  Greater than 50% was spent in counseling and coordination of care with the patient    Signed, Esmond Plants, M.D., Ph.D. [place], [place]

## 2019-10-08 ENCOUNTER — Other Ambulatory Visit: Payer: Self-pay

## 2019-10-08 ENCOUNTER — Ambulatory Visit (INDEPENDENT_AMBULATORY_CARE_PROVIDER_SITE_OTHER): Payer: Medicare Other | Admitting: Cardiovascular Disease

## 2019-10-08 ENCOUNTER — Encounter: Payer: Self-pay | Admitting: Cardiovascular Disease

## 2019-10-08 ENCOUNTER — Encounter: Payer: Self-pay | Admitting: Internal Medicine

## 2019-10-08 VITALS — BP 120/80 | HR 67 | Ht 67.0 in | Wt 243.0 lb

## 2019-10-08 DIAGNOSIS — I313 Pericardial effusion (noninflammatory): Secondary | ICD-10-CM | POA: Diagnosis not present

## 2019-10-08 DIAGNOSIS — R0602 Shortness of breath: Secondary | ICD-10-CM

## 2019-10-08 DIAGNOSIS — J90 Pleural effusion, not elsewhere classified: Secondary | ICD-10-CM

## 2019-10-08 DIAGNOSIS — I700 Atherosclerosis of aorta: Secondary | ICD-10-CM | POA: Insufficient documentation

## 2019-10-08 DIAGNOSIS — I3139 Other pericardial effusion (noninflammatory): Secondary | ICD-10-CM

## 2019-10-08 NOTE — Patient Instructions (Signed)
Medication Instructions:  No changes  If you need a refill on your cardiac medications before your next appointment, please call your pharmacy.    Lab work: No new labs needed   If you have labs (blood work) drawn today and your tests are completely normal, you will receive your results only by: . MyChart Message (if you have MyChart) OR . A paper copy in the mail If you have any lab test that is abnormal or we need to change your treatment, we will call you to review the results.   Testing/Procedures: No new testing needed   Follow-Up: At [facility], you and your health needs are our priority.  As part of our continuing mission to provide you with exceptional heart care, we have created designated Provider Care Teams.  These Care Teams include your primary Cardiologist (physician) and Advanced Practice Providers (APPs -  Physician Assistants and Nurse Practitioners) who all work together to provide you with the care you need, when you need it.  . You will need a follow up appointment as needed  . Providers on your designated Care Team:   . Christopher Berge, NP . Ryan Dunn, PA-C . Jacquelyn Visser, PA-C  Any Other Special Instructions Will Be Listed Below (If Applicable).  COVID-19 Vaccine Information can be found at: [email or web address] For questions related to vaccine distribution or appointments, please email [email or web address] or call [phone].     

## 2019-10-12 ENCOUNTER — Telehealth: Payer: Self-pay

## 2019-10-12 DIAGNOSIS — J90 Pleural effusion, not elsewhere classified: Secondary | ICD-10-CM

## 2019-10-12 NOTE — Telephone Encounter (Signed)
CXR ordered per Dr. Jayme Cloud. Please see 10/12/2019 mychart encounter.

## 2019-10-12 NOTE — Telephone Encounter (Signed)
Per Dr. Jayme Cloud verbally- schedule OV with NP with CXR prior.

## 2019-10-12 NOTE — Telephone Encounter (Signed)
Dr. Gonzalez, please advise. Thanks 

## 2019-10-16 LAB — ACID FAST CULTURE WITH REFLEXED SENSITIVITIES (MYCOBACTERIA): Acid Fast Culture: NEGATIVE

## 2019-10-19 ENCOUNTER — Encounter: Payer: Self-pay | Admitting: Primary Care

## 2019-10-19 ENCOUNTER — Ambulatory Visit
Admission: RE | Admit: 2019-10-19 | Discharge: 2019-10-19 | Disposition: A | Discharge status: Home / Self Care | Payer: Medicare Other | Source: Ambulatory Visit | Attending: Primary Care | Admitting: Primary Care

## 2019-10-19 ENCOUNTER — Ambulatory Visit
Admission: RE | Admit: 2019-10-19 | Discharge: 2019-10-19 | Disposition: A | Discharge status: Home / Self Care | Payer: Medicare Other | Attending: Primary Care | Admitting: Primary Care

## 2019-10-19 ENCOUNTER — Ambulatory Visit (INDEPENDENT_AMBULATORY_CARE_PROVIDER_SITE_OTHER): Payer: Medicare Other | Admitting: Primary Care

## 2019-10-19 ENCOUNTER — Other Ambulatory Visit: Payer: Self-pay

## 2019-10-19 VITALS — BP 122/80 | HR 74 | Temp 97.3°F | Ht 67.0 in | Wt 243.0 lb

## 2019-10-19 DIAGNOSIS — J90 Pleural effusion, not elsewhere classified: Secondary | ICD-10-CM

## 2019-10-19 MED ORDER — PREDNISONE 10 MG PO TABS
ORAL_TABLET | ORAL | 0 refills | Status: DC
Start: 2019-10-19 — End: 2020-02-18

## 2019-10-19 NOTE — Addendum Note (Signed)
Addended by: Glenford Bayley on: 10/19/2019 01:16 PM   Modules accepted: Orders

## 2019-10-19 NOTE — Progress Notes (Signed)
Discussed with Dr. Jayme Cloud, plan to continue prednisone 10mg  x 2 weeks then taper prednisone to 5mg  x additional 2 week. Repeat CXR in 4 weeks. If fluid did re-accumulate recommend referral to [facility] for potential talc pleurodesis

## 2019-10-19 NOTE — Patient Instructions (Addendum)
CXR today showed improvement in left pleural effusion, mild residual effusion with atelectasis Continue prednisone 10mg  daily until I speak with Dr. , we will likely taper or stop this Recommend using incentive spirometer 10 breaths three times a day to help take deep breaths Follow up CXR in 4 weeks to ensure fluid has not re-accumulated   Orders: CXR in 4 weeks   Follow-up: 4-6 weeks with Dr. Jayme Cloud or APP (in office or televisit ok)   Pleural Effusion Pleural effusion is an abnormal buildup of fluid in the layers of tissue between the lungs and the inside of the chest (pleural space) The two layers of tissue that line the lungs and the inside of the chest are called pleura. Usually, there is no air in the space between the pleura, only a thin layer of fluid. Some conditions can cause a large amount of fluid to build up, which can cause the lung to collapse if untreated. A pleural effusion is usually caused by another disease that requires treatment. What are the causes? Pleural effusion can be caused by:  Heart failure.  Certain infections, such as pneumonia or tuberculosis.  Cancer.  A blood clot in the lung (pulmonary embolism).  Complications from surgery, such as from open heart surgery.  Liver disease (cirrhosis).  Kidney disease. What are the signs or symptoms? In some cases, pleural effusion may cause no symptoms. If symptoms are present, they may include:  Shortness of breath, especially when lying down.  Chest pain. This may get worse when taking a deep breath.  Fever.  Dry, long-lasting (chronic) cough.  Hiccups.  Rapid breathing. An underlying condition that is causing the pleural effusion (such as heart failure, pneumonia, blood clots, tuberculosis, or cancer) may also cause other symptoms. How is this diagnosed? This condition may be diagnosed based on:  Your symptoms and medical history.  A physical exam.  A chest X-ray.  A procedure to  use a needle to remove fluid from the pleural space (thoracentesis). This fluid is tested.  Other imaging studies of the chest, such as ultrasound or CT scan. How is this treated? Depending on the cause of your condition, treatment may include:  Treating the underlying condition that is causing the effusion. When that condition improves, the effusion will also improve. Examples of treatment for underlying conditions include: ? Antibiotic medicines to treat an infection. ? Diuretics or other heart medicines to treat heart failure.  Thoracentesis.  Placing a thin flexible tube under your skin and into your chest to continuously drain the effusion (indwelling pleural catheter).  Surgery to remove the outer layer of tissue from the pleural space (decortication).  A procedure to put medicine into the chest cavity to seal the pleural space and prevent fluid buildup (pleurodesis).  Chemotherapy and radiation therapy, if you have cancerous (malignant) pleural effusion. These treatments are typically used to treat cancer. They kill certain cells in the body. Follow these instructions at home:  Take over-the-counter and prescription medicines only as told by your health care provider.  Ask your health care provider what activities are safe for you.  Keep track of how long you are able to do mild exercise (such as walking) before you get short of breath. Write down this information to share with your health care provider. Your ability to exercise should improve over time.  Do not use any products that contain nicotine or tobacco, such as cigarettes and e-cigarettes. If you need help quitting, ask your health care provider.  Keep all follow-up visits as told by your health care provider. This is important. Contact a health care provider if:  The amount of time that you are able to do mild exercise: ? Decreases. ? Does not improve with time.  You have a fever. Get help right away if:  You  are short of breath.  You develop chest pain.  You develop a new cough. Summary  Pleural effusion is an abnormal buildup of fluid in the layers of tissue between the lungs and the inside of the chest.  Pleural effusion can have many causes, including heart failure, pulmonary embolism, infections, or cancer.  Symptoms of pleural effusion can include shortness of breath, chest pain, fever, long-lasting (chronic) cough, hiccups, or rapid breathing.  Diagnosis often involves making images of the chest (such as with ultrasound or X-ray) and removing fluid (thoracentesis) to send for testing.  Treatment for pleural effusion depends on what underlying condition is causing it. This information is not intended to replace advice given to you by your health care provider. Make sure you discuss any questions you have with your health care provider. Document Revised: 01/17/2017 Document Reviewed: 10/10/2016 Elsevier Patient Education  2020 [employer].

## 2019-10-19 NOTE — Progress Notes (Signed)
@Patient  ID: , female    DOB: 1950/04/12, 69 y.o.   MRN: [id number]  Chief Complaint  Patient presents with  . Follow-up    CXR done today.  Breathing is much improved since the last visit. She is currently on pred 10 mg daily.     Referring provider: [id number], MD  HPI: 69 year old female, never smoked.  Past medical history significant for left pleural effusion, pleuropericarditis, allergic rhinitis, aortic arthrosclerosis, fibromyalgia, insomnia, prediabetes, depression/anxiety, left hip replacement.  Patient of Dr. 66, last seen on 10/05/2019.  In this visit she was given additional prednisone taper (20 mg x 5 days then day on 10 mg daily).  Advised patient to follow-up in 2 to 3 weeks with chest x-ray prior.  Dr. 10/07/2019 to discuss case with Dr. Jayme Cloud with thoracic surgery.  Previous [facility] pulmonary encounters: 09/09/19- Dr. 09/11/19 Patient is a 70 year old flow never smoker, who presents for follow-up on a LEFT pleural effusion.  She was initially seen on 02 September 2019.  For the details of that visit please see the note of that day.  She had 800 mL of exudative fluid drained via ultrasound-guided thoracentesis that day.  The fluid is lymphocytic predominant with markers that appear to be inflammatory.  Glucose was 119.  LDH mildly elevated.  The fluid was consistent with a parapneumonic effusion.  Patient has completed Levaquin therapy for presumed left lower lobe pneumonia, no organism specified.  Since her initial visit here the patient has felt markedly better.  Dyspnea improved.  She has not had any further issues with orthopnea which she was having previously.  She had chest x-ray performed today on follow-up and fluid appears to have recurred.  We discussed the possibilities and need for repeat thoracentesis and further studies.  Patient agrees to proceed with the same.  Patient has not had any fevers, chills or sweats since her initial evaluation here.  No cough  or sputum production.  She continues to have issues with sinus pressure and drainage, this is a chronic issue for her she has had recent sinus surgery in February 2021.  No other complaints are voiced.   10/19/2019- Interim hx Patient presents today for 2 to 3-week follow-up. She is feeling much better. She has no respiratory complaints. Repeat CXR today showed improvement in left pleural effusion. Prednisone has helped, she currently remains on 10mg  daily. She has some soreness on her left side. She had thoracentesis on 09/14/19. She reports that she did fall going down 3 steps into her garage prior to getting pneumonia. She does tend to have shallow breathing at baseline and does not take deep breaths. She is eating and drinking normally. Denies fever, chills, sweats, shortness of breath or cough.    Imaging: 10/19/19 CXR-  Mild left base atelectasis/infiltrate, improved from prior exam. Small left pleural effusion, improved from prior exam. Mild right base infiltrate tiny right pleural effusion cannot be excluded on today's exam  Allergies  Allergen Reactions  . Amoxicillin-Pot Clavulanate Nausea And Vomiting and Other (See Comments)    Other reaction(s): Vomiting  . Beef-Derived Products Nausea And Vomiting  . Pegademase Bovine Nausea And Vomiting  . Penicillins Nausea And Vomiting and Other (See Comments)    Has patient had a PCN reaction causing immediate rash, facial/tongue/throat swelling, SOB or lightheadedness with hypotension: no Has patient had a PCN reaction causing severe rash involving mucus membranes or skin necrosis: No Has patient had a PCN reaction that required hospitalization  No Has patient had a PCN reaction occurring within the last 10 years: Yes If all of the above answers are "NO", then may proceed with Cephalosporin use.         Immunization History  Administered Date(s) Administered  . Fluad Quad(high Dose 65+) 11/17/2018  . Influenza, High Dose Seasonal PF  11/05/2016, 02/04/2018  . Influenza,inj,Quad PF,6+ Mos 01/03/2015, 01/22/2016  . PFIZER SARS-COV-2 Vaccination 04/19/2019, 05/03/2019  . Pneumococcal Conjugate-13 10/03/2014  . Pneumococcal Polysaccharide-23 05/08/2015  . Tdap 10/03/2014  . Zoster 05/30/2011    Past Medical History:  Diagnosis Date  . Allergy   . Anxiety   . Depression   . History of shingles    ON AND OFF 6 YEARS, recurrent episodes of internal and external shingles with resulting neuralgia  . Neuromuscular disorder (HCC)    POSTHERPETIC NEURALGIA, left wrist weakness  . Pneumonia    HX   SEVERAL TIMES  NONE IN 3 YRS  . Post herpetic neuralgia   . Scoliosis   . Shingles    history of.  Recurrent  . Sinusitis 10/18/2016    Tobacco History: Social History   Tobacco Use  Smoking Status Never Smoker  Smokeless Tobacco Never Used   Counseling given: Not Answered   Outpatient Medications Prior to Visit  Medication Sig Dispense Refill  . Cholecalciferol (VITAMIN D3) 5000 UNITS CAPS Take 1 capsule (5,000 Units total) by mouth daily. 30 capsule   . Cyanocobalamin (VITAMIN B-12) 5000 MCG SUBL Place 15,000 mcg under the tongue daily.    . DULoxetine (CYMBALTA) 60 MG capsule TAKE 2 CAPSULES BY MOUTH DAILY 180 capsule 3  . fluticasone (FLONASE) 50 MCG/ACT nasal spray Place 2 sprays into both nostrils daily. 16 g 0  . Melatonin 1 MG TABS Take by mouth.    . Multiple Vitamins-Minerals (MULTIVITAMIN WOMEN 50+) TABS Take 1 tablet by mouth daily.    . predniSONE (DELTASONE) 10 MG tablet 2 tablets daily for 5 days then 1 tablet daily until seen again. 45 tablet 0  . sodium chloride (OCEAN) 0.65 % SOLN nasal spray Place 1 spray into both nostrils as needed for congestion.    . traZODone (DESYREL) 100 MG tablet Take 2 tablets (200 mg total) by mouth at bedtime. 180 tablet 1  . valACYclovir (VALTREX) 1000 MG tablet TAKE 1 TABLET BY MOUTH EVERY DAY 90 tablet 0   No facility-administered medications prior to visit.    Review of Systems  Review of Systems  Constitutional: Negative.   Respiratory: Negative for cough, shortness of breath and wheezing.   Cardiovascular: Negative.    Physical Exam  BP 122/80 (BP Location: Left Arm, Cuff Size: Large)   Pulse 74   Temp (!) 97.3 F (36.3 C) (Temporal)   Ht 5\' 7"  (1.702 m)   Wt 243 lb (110.2 kg)   SpO2 100% Comment: on RA  BMI 38.06 kg/m  Physical Exam Constitutional:      Appearance: Normal appearance.  HENT:     Head: Normocephalic and atraumatic.     Mouth/Throat:     Mouth: Mucous membranes are moist.     Pharynx: Oropharynx is clear.  Cardiovascular:     Rate and Rhythm: Normal rate and regular rhythm.  Pulmonary:     Effort: Pulmonary effort is normal.     Breath sounds: Normal breath sounds. No wheezing or rhonchi.  Musculoskeletal:     Cervical back: Normal range of motion and neck supple.  Skin:    General: Skin  is warm and dry.  Neurological:     General: No focal deficit present.     Mental Status: She is alert and oriented to person, place, and time. Mental status is at baseline.  Psychiatric:        Mood and Affect: Mood normal.        Behavior: Behavior normal.        Thought Content: Thought content normal.        Judgment: Judgment normal.      Lab Results:  CBC    Component Value Date/Time   WBC 5.2 06/07/2019 1427   WBC 7.2 10/11/2015 0344   RBC 4.12 06/07/2019 1427   RBC 3.42 (L) 10/11/2015 0344   HGB 12.8 06/07/2019 1427   HCT 37.6 06/07/2019 1427   PLT 313 06/07/2019 1427   MCV 91 06/07/2019 1427   MCH 31.1 06/07/2019 1427   MCH 31.6 10/11/2015 0344   MCHC 34.0 06/07/2019 1427   MCHC 32.6 10/11/2015 0344   RDW 13.8 06/07/2019 1427   LYMPHSABS 1.6 06/07/2019 1427   EOSABS 0.3 06/07/2019 1427   BASOSABS 0.0 06/07/2019 1427    BMET    Component Value Date/Time   NA 139 09/13/2019 1505   NA 142 06/07/2019 1427   K 4.0 09/13/2019 1505   CL 105 09/13/2019 1505   CO2 26 09/13/2019 1505   GLUCOSE  104 (H) 09/13/2019 1505   BUN 15 09/13/2019 1505   BUN 12 06/07/2019 1427   CREATININE 1.20 (H) 09/14/2019 0920   CALCIUM 9.1 09/13/2019 1505   GFRNONAA 46 (L) 09/13/2019 1505   GFRAA 54 (L) 09/13/2019 1505    BNP No results found for: BNP  ProBNP No results found for: PROBNP  Imaging: DG Chest 2 View  Result Date: 10/19/2019 CLINICAL DATA:  Left pleural effusion follow-up. EXAM: CHEST - 2 VIEW COMPARISON:  10/04/2019. FINDINGS: Mediastinum hilar structures normal. Heart size stable. Mild left base atelectasis/infiltrate, improved from prior exam. Small left pleural effusion, improved from prior exam. Mild right base infiltrate and tiny right pleural effusion cannot be excluded. No pneumothorax. IMPRESSION: 1. Mild left base atelectasis/infiltrate, improved from prior exam. Small left pleural effusion, improved from prior exam. 2. Mild right base infiltrate tiny right pleural effusion cannot be excluded on today's exam. Electronically Signed   By: Maisie Fus  Register   On: 10/19/2019 10:26   DG Chest 2 View  Result Date: 10/04/2019 CLINICAL DATA:  LEFT side thoracentesis 09/14/2019, still having LEFT sided chest pain EXAM: CHEST - 2 VIEW COMPARISON:  09/23/2019 FINDINGS: Upper normal heart size. Atherosclerotic calcification aorta. Mediastinal contours and pulmonary vascularity normal. LEFT basilar atelectasis and LEFT pleural effusion persist. Remaining lungs clear. No pneumothorax or acute osseous findings. IMPRESSION: Persistent LEFT pleural effusion and basilar atelectasis. Electronically Signed   By: Ulyses Southward M.D.   On: 10/04/2019 14:01   DG Chest 2 View  Result Date: 09/23/2019 CLINICAL DATA:  Evaluate pleural effusion. EXAM: CHEST - 2 VIEW COMPARISON:  September 15, 2019 FINDINGS: A predominant stable small to moderate size left pleural effusion is seen. Associated left basilar atelectasis and/or infiltrate is suspected. No pneumothorax is identified. The heart size and mediastinal  contours are within normal limits. The visualized skeletal structures are unremarkable. IMPRESSION: Stable small to moderate size left pleural effusion with associated left basilar atelectasis and/or infiltrate. Electronically Signed   By: Aram Candela M.D.   On: 09/23/2019 15:19     Assessment & Plan:   Pleural effusion, left  Clinically she is doing very well. She denies fever, shortness of breath or cough. CXR today showed improvement in left pleural effusion, mild residual effusion with atelectasis. Continue prednisone 10mg  daily until I speak with Dr. , we will likely taper or stop this. Recommend using incentive spirometer 10 breaths three times a day to help take deep breaths. Follow up CXR in 4 weeks to ensure fluid has not re-accumulated   Orders: CXR in 4 weeks   Follow-up: 4-6 weeks with Dr. Jayme Cloud or APP (in office or televisit ok)   Jayme Cloud, NP 10/19/2019

## 2019-10-19 NOTE — Assessment & Plan Note (Signed)
Clinically she is doing very well. She denies fever, shortness of breath or cough. CXR today showed improvement in left pleural effusion, mild residual effusion with atelectasis. Continue prednisone 10mg  daily until I speak with Dr. , we will likely taper or stop this. Recommend using incentive spirometer 10 breaths three times a day to help take deep breaths. Follow up CXR in 4 weeks to ensure fluid has not re-accumulated   Orders: CXR in 4 weeks   Follow-up: 4-6 weeks with Dr. Jayme Cloud or APP (in office or televisit ok)

## 2019-11-01 ENCOUNTER — Other Ambulatory Visit: Payer: Self-pay | Admitting: Internal Medicine

## 2019-11-01 DIAGNOSIS — G4700 Insomnia, unspecified: Secondary | ICD-10-CM

## 2019-11-05 NOTE — Progress Notes (Signed)
Agree with the details of the visit as noted by Elizabeth Walsh, NP.  C. Laura Chibuikem Thang, MD [facility] 

## 2019-11-10 ENCOUNTER — Encounter: Payer: Self-pay | Admitting: Internal Medicine

## 2019-11-10 ENCOUNTER — Other Ambulatory Visit: Payer: Self-pay

## 2019-11-10 ENCOUNTER — Ambulatory Visit (INDEPENDENT_AMBULATORY_CARE_PROVIDER_SITE_OTHER): Payer: Medicare Other | Admitting: Internal Medicine

## 2019-11-10 VITALS — BP 108/74 | HR 74 | Temp 97.6°F | Ht 67.0 in | Wt 244.0 lb

## 2019-11-10 DIAGNOSIS — I6789 Other cerebrovascular disease: Secondary | ICD-10-CM | POA: Diagnosis not present

## 2019-11-10 DIAGNOSIS — J90 Pleural effusion, not elsewhere classified: Secondary | ICD-10-CM

## 2019-11-10 DIAGNOSIS — I700 Atherosclerosis of aorta: Secondary | ICD-10-CM

## 2019-11-10 DIAGNOSIS — Z23 Encounter for immunization: Secondary | ICD-10-CM

## 2019-11-10 NOTE — Progress Notes (Signed)
Date:  11/10/2019   Name:  Ann Peters   DOB:  1950-04-17   MRN:  [id number]   Chief Complaint: Ischemia (neurologist -   Ischemic stroke, poor blood flow to brain, peri. neuro. ) and Flu Vaccine  "Brain zaps" - patient referred to Neurology for concern of MS.  She has had a full evaluation with imaging and labs.  She was found to have had a small chronic right cerebellar infarct and probably mild chronic microvascular ischemic changes.  Labs were all normal.  She was advised to begin aspirin 325 mg daily and to discuss statin therapy with me.  She is not eager to take a statin.  CT chest showed very minimal aortic atherosclerosis per cardiology.  She has not started daily aspirin at this time.  Pericardial effusion - noted several months ago after a bout of pneumonia.  She is being monitored by Cardiology.  No intervention at this time.  Pleural effusion - found several months ago.  Full work up unrevealing - other than signs of inflammation.  She has had thoracenteses x 2.  Pleurodesis was discussed but she did not elect that.  She is now on prednisone and has follow up with Pulmonary.  She currently feels well with no limitation to activity.  Lab Results  Component Value Date   CREATININE 1.20 (H) 09/14/2019   BUN 15 09/13/2019   NA 139 09/13/2019   K 4.0 09/13/2019   CL 105 09/13/2019   CO2 26 09/13/2019   Lab Results  Component Value Date   CHOL 173 11/19/2013   HDL 51 11/19/2013   LDLCALC 100 11/19/2013   TRIG 111 11/19/2013   Lab Results  Component Value Date   TSH 3.710 11/17/2018   Lab Results  Component Value Date   HGBA1C 5.8 (H) 06/07/2019   Lab Results  Component Value Date   WBC 5.2 06/07/2019   HGB 12.8 06/07/2019   HCT 37.6 06/07/2019   MCV 91 06/07/2019   PLT 313 06/07/2019   Lab Results  Component Value Date   ALT 13 06/07/2019   AST 19 06/07/2019   ALKPHOS 98 06/07/2019   BILITOT 0.4 06/07/2019     Review of Systems  Constitutional:  Negative for chills, fatigue and fever.  HENT: Negative for trouble swallowing.   Respiratory: Negative for cough, chest tightness and shortness of breath.   Cardiovascular: Negative for chest pain and leg swelling.  Gastrointestinal: Negative for abdominal pain.  Neurological: Negative for dizziness, light-headedness and headaches.  Psychiatric/Behavioral: Negative for confusion. The patient is not nervous/anxious.     Patient Active Problem List   Diagnosis Date Noted  . Pleural effusion, left 10/19/2019  . Aortic atherosclerosis (HCC) 10/08/2019  . Personal history of colonic polyps   . Anemia 02/17/2017  . Insomnia 11/05/2016  . Osteoarthritis of left hip 10/10/2015  . Status post left hip replacement 10/10/2015  . Onychomycosis of left great toe 05/10/2015  . Prediabetes 01/03/2015  . Anxiety 09/01/2014  . Diverticulitis 09/01/2014  . Vitamin D deficiency 09/01/2014  . Herpes zoster 09/01/2014  . Fibromyalgia 11/15/2013  . Recurrent major depressive disorder, in partial remission (HCC) 11/15/2013  . Vitamin B12 deficiency 12/23/2011  . Allergic rhinitis 04/18/2011    Allergies  Allergen Reactions  . Amoxicillin-Pot Clavulanate Nausea And Vomiting and Other (See Comments)    Other reaction(s): Vomiting  . Beef-Derived Products Nausea And Vomiting  . Pegademase Bovine Nausea And Vomiting  . Penicillins Nausea And  Vomiting and Other (See Comments)    Has patient had a PCN reaction causing immediate rash, facial/tongue/throat swelling, SOB or lightheadedness with hypotension: no Has patient had a PCN reaction causing severe rash involving mucus membranes or skin necrosis: No Has patient had a PCN reaction that required hospitalization No Has patient had a PCN reaction occurring within the last 10 years: Yes If all of the above answers are "NO", then may proceed with Cephalosporin use.         Past Surgical History:  Procedure Laterality Date  . BLADDER REPAIR    .  COLON SURGERY  2011   10 inches removed for benign mass, does not absorb nutrients as well  . COLONOSCOPY  2011   polyp removed- Dr Marva Panda  . COLONOSCOPY WITH PROPOFOL N/A 07/04/2017   Procedure: COLONOSCOPY WITH PROPOFOL;  Surgeon: Midge Minium, MD;  Location: [facility];  Service: Endoscopy;  Laterality: N/A;  recurrent shingles  . NASAL SINUS SURGERY    . TONSILLECTOMY    . TOTAL HIP ARTHROPLASTY Left 10/10/2015   Procedure: LEFT TOTAL HIP ARTHROPLASTY ANTERIOR APPROACH;  Surgeon: Kathryne Hitch, MD;  Location: [facility] OR;  Service: Orthopedics;  Laterality: Left;  . TRIGGER FINGER RELEASE Left 03/28/2017   Procedure: MIDDLE FINGER DUPREYTRENS RELEASE;  Surgeon: Erin Sons, MD;  Location: [facility];  Service: Orthopedics;  Laterality: Left;  . TUBAL LIGATION    . TURBINATE REDUCTION Bilateral 04/29/2019   Procedure: TURBINATE REDUCTION WITH VIVEAR;  Surgeon: Vernie Murders, MD;  Location: [facility];  Service: ENT;  Laterality: Bilateral;  vivear  . VAGINAL HYSTERECTOMY     partial    Social History   Tobacco Use  . Smoking status: Never Smoker  . Smokeless tobacco: Never Used  Vaping Use  . Vaping Use: Never used  Substance Use Topics  . Alcohol use: No    Alcohol/week: 0.0 standard drinks  . Drug use: No     Medication list has been reviewed and updated.  Current Meds  Medication Sig  . Cholecalciferol (VITAMIN D3) 5000 UNITS CAPS Take 1 capsule (5,000 Units total) by mouth daily.  . Cyanocobalamin (VITAMIN B-12) 5000 MCG SUBL Place 15,000 mcg under the tongue daily.  . DULoxetine (CYMBALTA) 60 MG capsule TAKE 2 CAPSULES BY MOUTH DAILY  . fluticasone (FLONASE) 50 MCG/ACT nasal spray Place 2 sprays into both nostrils daily.  . Multiple Vitamins-Minerals (MULTIVITAMIN WOMEN 50+) TABS Take 1 tablet by mouth daily.  . predniSONE (DELTASONE) 10 MG tablet Take 1 tablet daily x 2 weeks; then 1/2 tablet daily x 2 weeks  . sodium chloride  (OCEAN) 0.65 % SOLN nasal spray Place 1 spray into both nostrils as needed for congestion.  . traZODone (DESYREL) 100 MG tablet TAKE 2 TABLETS(200 MG) BY MOUTH AT BEDTIME  . valACYclovir (VALTREX) 1000 MG tablet TAKE 1 TABLET BY MOUTH EVERY DAY (Patient taking differently: as needed. )    PHQ 2/9 Scores 11/10/2019 06/07/2019 03/08/2019 11/17/2018  PHQ - 2 Score 0 2 0 0  PHQ- 9 Score 0 11 - 0    GAD 7 : Generalized Anxiety Score 11/10/2019 06/07/2019 11/17/2018 10/18/2016  Nervous, Anxious, on Edge 0 0 0 3  Control/stop worrying 0 0 0 3  Worry too much - different things 0 0 0 3  Trouble relaxing 0 2 0 3  Restless 0 1 0 3  Easily annoyed or irritable 0 0 0 1  Afraid - awful might happen 0 0 0  3  Total GAD 7 Score 0 3 0 19  Anxiety Difficulty Not difficult at all Not difficult at all - Extremely difficult    BP Readings from Last 3 Encounters:  11/10/19 108/74  10/19/19 122/80  10/08/19 120/80    Physical Exam Vitals and nursing note reviewed.  Constitutional:      General: She is not in acute distress.    Appearance: She is well-developed.  HENT:     Head: Normocephalic and atraumatic.  Neck:     Vascular: No carotid bruit.  Cardiovascular:     Rate and Rhythm: Normal rate and regular rhythm.     Pulses: Normal pulses.     Heart sounds: No murmur heard.   Pulmonary:     Effort: Pulmonary effort is normal. No respiratory distress.     Breath sounds: No decreased breath sounds, wheezing or rhonchi.  Musculoskeletal:        General: Normal range of motion.     Cervical back: Normal range of motion.  Lymphadenopathy:     Cervical: No cervical adenopathy.  Skin:    General: Skin is warm and dry.     Findings: No rash.  Neurological:     Mental Status: She is alert and oriented to person, place, and time.  Psychiatric:        Attention and Perception: Attention normal.     Wt Readings from Last 3 Encounters:  11/10/19 244 lb (110.7 kg)  10/19/19 243 lb (110.2 kg)    10/08/19 243 lb (110.2 kg)    BP 108/74 (BP Location: Right Arm, Patient Position: Sitting)   Pulse 74   Temp 97.6 F (36.4 C) (Oral)   Ht 5\' 7"  (1.702 m)   Wt 244 lb (110.7 kg)   SpO2 98%   BMI 38.22 kg/m   Assessment and Plan: 1. Pleural effusion, left Continue prednisone and pulmonary follow up Normal exam today  2. Cerebral microvascular disease Recommend ASA daily once pulmonary and cardiac issues are resolved  3. Aortic atherosclerosis (HCC) Minimal per cardiology - no statin recommended at this time  4. Need for immunization against influenza - Flu Vaccine QUAD High Dose(Fluad)   Partially dictated using . Any errors are unintentional.  Animal nutritionist, MD [facility]  11/10/2019

## 2019-11-29 ENCOUNTER — Other Ambulatory Visit: Payer: Self-pay | Admitting: Internal Medicine

## 2019-11-30 ENCOUNTER — Encounter: Payer: Self-pay | Admitting: Pulmonary Disease

## 2019-11-30 ENCOUNTER — Other Ambulatory Visit: Payer: Self-pay

## 2019-11-30 ENCOUNTER — Ambulatory Visit
Admission: RE | Admit: 2019-11-30 | Discharge: 2019-11-30 | Disposition: A | Discharge status: Home / Self Care | Payer: Medicare Other | Source: Ambulatory Visit | Attending: Pulmonary Disease | Admitting: Pulmonary Disease

## 2019-11-30 ENCOUNTER — Ambulatory Visit
Admission: RE | Admit: 2019-11-30 | Discharge: 2019-11-30 | Disposition: A | Discharge status: Home / Self Care | Payer: Medicare Other | Attending: Pulmonary Disease | Admitting: Pulmonary Disease

## 2019-11-30 ENCOUNTER — Ambulatory Visit (INDEPENDENT_AMBULATORY_CARE_PROVIDER_SITE_OTHER): Payer: Medicare Other | Admitting: Pulmonary Disease

## 2019-11-30 VITALS — BP 114/78 | HR 92 | Temp 96.9°F | Ht 67.0 in | Wt 248.6 lb

## 2019-11-30 DIAGNOSIS — J189 Pneumonia, unspecified organism: Secondary | ICD-10-CM

## 2019-11-30 DIAGNOSIS — I319 Disease of pericardium, unspecified: Secondary | ICD-10-CM

## 2019-11-30 DIAGNOSIS — J90 Pleural effusion, not elsewhere classified: Secondary | ICD-10-CM | POA: Insufficient documentation

## 2019-11-30 NOTE — Progress Notes (Signed)
Subjective:    Patient ID: Ann Peters, female    DOB: [birth year], 69 y.o.   MRN: [id number]  HPI Patient is a 69 year old lifelong never smoker who presents for follow-up of a LEFT pleural effusion.  She was initially seen on 02 September 2019.  She had thoracentesis x2 which was consistent with a lymphocytic predominant exudate with inflammatory markers that were positive.  At the time the patient had had issues with what appeared to have been a pneumonia versus atelectasis on the left lower lobe.  The patient was also noted to have a pericardial effusion.  This was likely than a pleural pericarditis either due to viral infection or atypical infection.  She was adequately treated with antibiotics and then subsequently treated with prednisone after need for a second thoracentesis.  Cytology was consistent with inflammation.  She was last seen 31 August with marked improvement in her breathing.  She has been on prednisone 10 mg daily which she was supposed to taper down to 5 mg daily however she just stopped this approximately a week ago.  She has not had any difficulties since stopping the prednisone.  Today she had a chest x-ray prior to presenting here which has been reviewed.  By independent review there is no evidence of fluid recurrence there is minimal atelectasis and or scarring on the left base.  Formal interpretation is pending.  The film was shown to the patient and her husband.  He has not had any fevers, chills or sweats.  No cough or sputum production.  She feels markedly better and feels that she is "back to normal".  She feels well and looks well.  Review of Systems A 10 point review of systems was performed and it is as noted above otherwise negative.  Allergies  Allergen Reactions  . Amoxicillin-Pot Clavulanate Nausea And Vomiting and Other (See Comments)    Other reaction(s): Vomiting  . Beef-Derived Products Nausea And Vomiting  . Pegademase Bovine Nausea And Vomiting  .  Penicillins Nausea And Vomiting and Other (See Comments)    Has patient had a PCN reaction causing immediate rash, facial/tongue/throat swelling, SOB or lightheadedness with hypotension: no Has patient had a PCN reaction causing severe rash involving mucus membranes or skin necrosis: No Has patient had a PCN reaction that required hospitalization No Has patient had a PCN reaction occurring within the last 10 years: Yes If all of the above answers are "NO", then may proceed with Cephalosporin use.        Current Meds  Medication Sig  . Cholecalciferol (VITAMIN D3) 5000 UNITS CAPS Take 1 capsule (5,000 Units total) by mouth daily.  . Cyanocobalamin (VITAMIN B-12) 5000 MCG SUBL Place 15,000 mcg under the tongue daily.  . DULoxetine (CYMBALTA) 60 MG capsule TAKE 2 CAPSULES BY MOUTH DAILY  . fluticasone (FLONASE) 50 MCG/ACT nasal spray Place 2 sprays into both nostrils daily.  . Multiple Vitamins-Minerals (MULTIVITAMIN WOMEN 50+) TABS Take 1 tablet by mouth daily.  . sodium chloride (OCEAN) 0.65 % SOLN nasal spray Place 1 spray into both nostrils as needed for congestion.  . traZODone (DESYREL) 100 MG tablet TAKE 2 TABLETS(200 MG) BY MOUTH AT BEDTIME  . valACYclovir (VALTREX) 1000 MG tablet TAKE 1 TABLET BY MOUTH EVERY DAY (Patient taking differently: as needed. )   Immunization History  Administered Date(s) Administered  . Fluad Quad(high Dose 65+) 11/17/2018, 11/10/2019  . Influenza, High Dose Seasonal PF 11/05/2016, 02/04/2018  . Influenza,inj,Quad PF,6+ Mos 01/03/2015, 01/22/2016  .  PFIZER SARS-COV-2 Vaccination 04/19/2019, 05/03/2019  . Pneumococcal Conjugate-13 10/03/2014  . Pneumococcal Polysaccharide-23 05/08/2015  . Tdap 10/03/2014  . Zoster 05/30/2011       Objective:   Physical Exam BP 114/78 (BP Location: Left Arm, Patient Position: Sitting, Cuff Size: Normal)   Pulse 92   Temp (!) 96.9 F (36.1 C) (Temporal)   Ht 5\' 7"  (1.702 m)   Wt 248 lb 9.6 oz (112.8 kg)   SpO2  96%   BMI 38.94 kg/m  GENERAL: Obese woman, alert, no acute distress. No tachypnea noted. HEAD: Normocephalic, atraumatic.  EYES: Pupils equal, round, reactive to light. No scleral icterus.  MOUTH: Nose/mouth/throat not examined due to masking requirements for COVID 19. NECK: Supple. No thyromegaly. Trachea midline. No JVD. No adenopathy. PULMONARY: Good air entry bilaterally, symmetrical.  No adventitious sounds, clear lungs. CARDIOVASCULAR: S1 and S2. Regular rate and rhythm. No rubs, murmurs or gallops heard. GASTROINTESTINAL: Abdomen is obese otherwise unremarkable. MUSCULOSKELETAL: No joint deformity, no clubbing, no edema.  NEUROLOGIC: No focal deficit noted.  Fluent speech.  No gait disturbance noted. SKIN: Intact,warm,dry. Limited exam no rashes. PSYCH: Mood and behavior are normal.  Chest x-ray performed today, independently reviewed: Noted thoracic scoliosis as prior.  Thoracic scoliosis, normal cardiac silhouette, no recurrence of LEFT pleural effusion with mild atelectasis/scarring.  Formal interpretation pending.        Assessment & Plan:     ICD-10-CM   1. Pleuropericarditis - resolved  I31.9    By clinical impression pleuropericarditis appears resolved No recurrence of left pleural effusion Cardiac silhouette normal  2. Community acquired pneumonia of left lower lobe of lung  J18.9    Resolved No organism specified Suspect atypical   Discussion:  Patient appears to have resolve her episode of pleuropericarditis.  This was likely due to either viral/atypical pneumonia.  On chest x-ray today her cardiac silhouette is normal and no evidence of recurrent left pleural effusion.  She has stopped all prednisone.  We will see the patient on an as-needed basis.  She is to continue follow-up with her regular physician Dr. .  Bari Edward, MD [facility]   *This note was dictated using voice recognition software/Dragon.  Despite best efforts to  proofread, errors can occur which can change the meaning.  Any change was purely unintentional.

## 2019-11-30 NOTE — Patient Instructions (Signed)
We will see you as needed. 

## 2019-11-30 NOTE — Progress Notes (Deleted)
   Subjective:    Patient ID: Ann Peters, female    DOB: 1950/12/15, 69 y.o.   MRN: [id number]  HPI    Review of Systems     Objective:   Physical Exam        Assessment & Plan:

## 2020-01-30 ENCOUNTER — Other Ambulatory Visit: Payer: Self-pay | Admitting: Internal Medicine

## 2020-01-30 DIAGNOSIS — G4700 Insomnia, unspecified: Secondary | ICD-10-CM

## 2020-01-31 ENCOUNTER — Ambulatory Visit (INDEPENDENT_AMBULATORY_CARE_PROVIDER_SITE_OTHER): Payer: Medicare Other

## 2020-01-31 ENCOUNTER — Other Ambulatory Visit: Payer: Self-pay

## 2020-01-31 ENCOUNTER — Encounter: Payer: Self-pay | Admitting: Orthopaedic Surgery

## 2020-01-31 ENCOUNTER — Ambulatory Visit (INDEPENDENT_AMBULATORY_CARE_PROVIDER_SITE_OTHER): Payer: Medicare Other | Admitting: Orthopaedic Surgery

## 2020-01-31 DIAGNOSIS — G8929 Other chronic pain: Secondary | ICD-10-CM

## 2020-01-31 DIAGNOSIS — M25561 Pain in right knee: Secondary | ICD-10-CM | POA: Diagnosis not present

## 2020-01-31 NOTE — Progress Notes (Signed)
Office Visit Note   Patient: Ann Peters           Date of Birth: [birth year]           MRN: [id number] Visit Date: 01/31/2020              Requested by: Reubin Milan, MD [address] [place],  [place] [address] PCP: Reubin Milan, MD   Assessment & Plan: Visit Diagnoses:  1. Chronic pain of right knee     Plan: Given her signs and symptoms of right knee instability combined with the failure of conservative treatment and my clinical exam, a MRI of her right knee is warranted to assess the cartilage and to rule out a meniscal tear.  We are going to put her in a knee brace for now for stability purposes.  She will continue her quad strengthening exercises that she is already been working on.  All questions and concerns were answered and addressed.  She will call for follow-up when she has the MRI.  Follow-Up Instructions: No follow-ups on file.   Orders:  Orders Placed This Encounter  Procedures  . XR Knee 1-2 Views Right   No orders of the defined types were placed in this encounter.     Procedures: No procedures performed   Clinical Data: No additional findings.   Subjective: Chief Complaint  Patient presents with  . Right Knee - Pain  The patient is a 69 year old female that I am seeing before.  She comes in with right knee pain but really feeling like her right knee is unstable and she is having problems with it buckling.  She is even fallen down the stairs recently.  She says her knee does ache at night and she has a put a pillow between her knees.  I have injected her knee before that did help for a while.  She denies any catching in the knee but it just feels so unstable to her at this point.  She is never had surgery on that right knee.  She otherwise denies any acute changes in her medical status.  HPI  Review of Systems She currently denies any headache, chest pain, shortness of breath, fever, chills, nausea, vomiting  Objective: Vital  Signs: There were no vitals taken for this visit.  Physical Exam She is alert and orient x3 and in no acute distress Ortho Exam Examination of her right knee shows global tenderness especially medially.  There is a positive McMurray's exam to the medial compartment the knee.  Her Lachman's is negative and she has good range of motion of that knee.  There is no effusion. Specialty Comments:  No specialty comments available.  Imaging: XR Knee 1-2 Views Right  Result Date: 01/31/2020 2 views of the right knee shows no acute findings.  There is slight medial joint space narrowing.    PMFS History: Patient Active Problem List   Diagnosis Date Noted  . Cerebral microvascular disease 11/10/2019  . Pleural effusion, left 10/19/2019  . Aortic atherosclerosis (HCC) 10/08/2019  . Personal history of colonic polyps   . Anemia 02/17/2017  . Insomnia 11/05/2016  . Osteoarthritis of left hip 10/10/2015  . Status post left hip replacement 10/10/2015  . Onychomycosis of left great toe 05/10/2015  . Prediabetes 01/03/2015  . Anxiety 09/01/2014  . Diverticulitis 09/01/2014  . Vitamin D deficiency 09/01/2014  . Herpes zoster 09/01/2014  . Fibromyalgia 11/15/2013  . Recurrent major depressive disorder, in partial  remission (HCC) 11/15/2013  . Vitamin B12 deficiency 12/23/2011  . Allergic rhinitis 04/18/2011   Past Medical History:  Diagnosis Date  . Allergy   . Anxiety   . Depression   . History of shingles    ON AND OFF 6 YEARS, recurrent episodes of internal and external shingles with resulting neuralgia  . Neuromuscular disorder (HCC)    POSTHERPETIC NEURALGIA, left wrist weakness  . Pneumonia    HX   SEVERAL TIMES  NONE IN 3 YRS  . Post herpetic neuralgia   . Scoliosis   . Shingles    history of.  Recurrent  . Sinusitis 10/18/2016    Family History  Problem Relation Age of Onset  . Lymphoma Mother   . Sarcoidosis Mother   . CAD Father   . Breast cancer Sister 34  . Brain  cancer Sister     Past Surgical History:  Procedure Laterality Date  . BLADDER REPAIR    . COLON SURGERY  2011   10 inches removed for benign mass, does not absorb nutrients as well  . COLONOSCOPY  2011   polyp removed- Dr Marva Panda  . COLONOSCOPY WITH PROPOFOL N/A 07/04/2017   Procedure: COLONOSCOPY WITH PROPOFOL;  Surgeon: Midge Minium, MD;  Location: [facility];  Service: Endoscopy;  Laterality: N/A;  recurrent shingles  . NASAL SINUS SURGERY    . TONSILLECTOMY    . TOTAL HIP ARTHROPLASTY Left 10/10/2015   Procedure: LEFT TOTAL HIP ARTHROPLASTY ANTERIOR APPROACH;  Surgeon: Kathryne Hitch, MD;  Location: [facility];  Service: Orthopedics;  Laterality: Left;  . TRIGGER FINGER RELEASE Left 03/28/2017   Procedure: MIDDLE FINGER DUPREYTRENS RELEASE;  Surgeon: Erin Sons, MD;  Location: [facility];  Service: Orthopedics;  Laterality: Left;  . TUBAL LIGATION    . TURBINATE REDUCTION Bilateral 04/29/2019   Procedure: TURBINATE REDUCTION WITH VIVEAR;  Surgeon: Vernie Murders, MD;  Location: [facility];  Service: ENT;  Laterality: Bilateral;  vivear  . VAGINAL HYSTERECTOMY     partial   Social History   Occupational History  . Occupation: Retired  Tobacco Use  . Smoking status: Never Smoker  . Smokeless tobacco: Never Used  Vaping Use  . Vaping Use: Never used  Substance and Sexual Activity  . Alcohol use: No    Alcohol/week: 0.0 standard drinks  . Drug use: No  . Sexual activity: Yes

## 2020-02-01 ENCOUNTER — Other Ambulatory Visit: Payer: Self-pay

## 2020-02-01 DIAGNOSIS — G8929 Other chronic pain: Secondary | ICD-10-CM

## 2020-02-18 ENCOUNTER — Ambulatory Visit (INDEPENDENT_AMBULATORY_CARE_PROVIDER_SITE_OTHER): Payer: Medicare Other

## 2020-02-18 ENCOUNTER — Ambulatory Visit
Admission: EM | Admit: 2020-02-18 | Discharge: 2020-02-18 | Disposition: A | Discharge status: Home / Self Care | Payer: Medicare Other | Attending: Family Medicine | Admitting: Family Medicine

## 2020-02-18 ENCOUNTER — Other Ambulatory Visit: Payer: Self-pay

## 2020-02-18 DIAGNOSIS — R070 Pain in throat: Secondary | ICD-10-CM

## 2020-02-18 DIAGNOSIS — H53143 Visual discomfort, bilateral: Secondary | ICD-10-CM | POA: Diagnosis not present

## 2020-02-18 DIAGNOSIS — J029 Acute pharyngitis, unspecified: Secondary | ICD-10-CM | POA: Insufficient documentation

## 2020-02-18 DIAGNOSIS — H1013 Acute atopic conjunctivitis, bilateral: Secondary | ICD-10-CM

## 2020-02-18 DIAGNOSIS — Z20822 Contact with and (suspected) exposure to covid-19: Secondary | ICD-10-CM | POA: Insufficient documentation

## 2020-02-18 DIAGNOSIS — Z8709 Personal history of other diseases of the respiratory system: Secondary | ICD-10-CM | POA: Diagnosis not present

## 2020-02-18 LAB — RESP PANEL BY RT-PCR (FLU A&B, COVID) ARPGX2
Influenza A by PCR: NEGATIVE
Influenza B by PCR: NEGATIVE
SARS Coronavirus 2 by RT PCR: NEGATIVE

## 2020-02-18 MED ORDER — AZELASTINE HCL 0.05 % OP SOLN: 1.0000 [drp] | Freq: Two times a day (BID) | OPHTHALMIC | 0 refills | Status: DC

## 2020-02-18 NOTE — Discharge Instructions (Signed)
I have sent in medication regarding your eyes. If symptoms persist see an eye doctor. I recommend [facility].  COVID and flu testing negative.  Chest xray was normal today. No pleural effusion.  Take care  Dr. Adriana Simas

## 2020-02-18 NOTE — ED Provider Notes (Signed)
[facility]    CSN: [id number] Arrival date & time: 02/18/20  0825      History   Chief Complaint Chief Complaint  Patient presents with  . Sore Throat  . eye irritation   HPI  69 year old female presents with multiple complaints.  Patient reports that she has been experiencing bilateral eye irritation since 12/20.  Denies any discharge.  She states that they burn and itch.  She has been using some over-the-counter eyedrops without relief.  Patient also reports that she has had intermittent pains in her throat.  She states that she had "red bumps" on her tongue this morning.  They are currently resolved.  Additionally, patient reports that she has had some pain in her lung.  Patient has a recent history of pleural effusion and would like a chest x-ray today.  Past Medical History:  Diagnosis Date  . Allergy   . Anxiety   . Depression   . History of shingles    ON AND OFF 6 YEARS, recurrent episodes of internal and external shingles with resulting neuralgia  . Neuromuscular disorder (HCC)    POSTHERPETIC NEURALGIA, left wrist weakness  . Pneumonia    HX   SEVERAL TIMES  NONE IN 3 YRS  . Post herpetic neuralgia   . Scoliosis   . Shingles    history of.  Recurrent  . Sinusitis 10/18/2016    Patient Active Problem List   Diagnosis Date Noted  . Cerebral microvascular disease 11/10/2019  . Pleural effusion, left 10/19/2019  . Aortic atherosclerosis (HCC) 10/08/2019  . Personal history of colonic polyps   . Anemia 02/17/2017  . Insomnia 11/05/2016  . Osteoarthritis of left hip 10/10/2015  . Status post left hip replacement 10/10/2015  . Onychomycosis of left great toe 05/10/2015  . Prediabetes 01/03/2015  . Anxiety 09/01/2014  . Diverticulitis 09/01/2014  . Vitamin D deficiency 09/01/2014  . Herpes zoster 09/01/2014  . Fibromyalgia 11/15/2013  . Recurrent major depressive disorder, in partial remission (HCC) 11/15/2013  . Vitamin B12 deficiency  12/23/2011  . Allergic rhinitis 04/18/2011    Past Surgical History:  Procedure Laterality Date  . BLADDER REPAIR    . COLON SURGERY  2011   10 inches removed for benign mass, does not absorb nutrients as well  . COLONOSCOPY  2011   polyp removed- Dr Marva Panda  . COLONOSCOPY WITH PROPOFOL N/A 07/04/2017   Procedure: COLONOSCOPY WITH PROPOFOL;  Surgeon: Midge Minium, MD;  Location: [facility];  Service: Endoscopy;  Laterality: N/A;  recurrent shingles  . NASAL SINUS SURGERY    . TONSILLECTOMY    . TOTAL HIP ARTHROPLASTY Left 10/10/2015   Procedure: LEFT TOTAL HIP ARTHROPLASTY ANTERIOR APPROACH;  Surgeon: Kathryne Hitch, MD;  Location: [facility];  Service: Orthopedics;  Laterality: Left;  . TRIGGER FINGER RELEASE Left 03/28/2017   Procedure: MIDDLE FINGER DUPREYTRENS RELEASE;  Surgeon: Erin Sons, MD;  Location: [facility];  Service: Orthopedics;  Laterality: Left;  . TUBAL LIGATION    . TURBINATE REDUCTION Bilateral 04/29/2019   Procedure: TURBINATE REDUCTION WITH VIVEAR;  Surgeon: Vernie Murders, MD;  Location: [facility];  Service: ENT;  Laterality: Bilateral;  vivear  . VAGINAL HYSTERECTOMY     partial    OB History    Gravida  4   Para      Term      Preterm      AB  1   Living  SAB  1   IAB      Ectopic      Multiple      Live Births               Home Medications    Prior to Admission medications   Medication Sig Start Date End Date Taking? Authorizing Provider  azelastine (OPTIVAR) 0.05 % ophthalmic solution Place 1 drop into both eyes 2 (two) times daily. 02/18/20  Yes Keaunna Skipper G, DO  Cholecalciferol (VITAMIN D3) 5000 UNITS CAPS Take 1 capsule (5,000 Units total) by mouth daily. 09/01/14   Plonk, Chrissie Noa, MD  Cyanocobalamin (VITAMIN B-12) 5000 MCG SUBL Place 15,000 mcg under the tongue daily.    [provider]  DULoxetine (CYMBALTA) 60 MG capsule TAKE 2 CAPSULES BY MOUTH DAILY 05/24/19   Reubin Milan, MD  fluticasone [facility]) 50 MCG/ACT nasal spray Place 2 sprays into both nostrils daily. 09/17/16   Lutricia Feil, PA-C  Multiple Vitamins-Minerals (MULTIVITAMIN WOMEN 50+) TABS Take 1 tablet by mouth daily.    [provider]  sodium chloride (OCEAN) 0.65 % SOLN nasal spray Place 1 spray into both nostrils as needed for congestion.    [provider]  traZODone (DESYREL) 100 MG tablet TAKE 2 TABLETS(200 MG) BY MOUTH AT BEDTIME 01/31/20   Reubin Milan, MD  valACYclovir (VALTREX) 1000 MG tablet TAKE 1 TABLET BY MOUTH EVERY DAY Patient taking differently: as needed.  09/02/19   Reubin Milan, MD    Family History Family History  Problem Relation Age of Onset  . Lymphoma Mother   . Sarcoidosis Mother   . CAD Father   . Breast cancer Sister 38  . Brain cancer Sister     Social History Social History   Tobacco Use  . Smoking status: Never Smoker  . Smokeless tobacco: Never Used  Vaping Use  . Vaping Use: Never used  Substance Use Topics  . Alcohol use: No    Alcohol/week: 0.0 standard drinks  . Drug use: No     Allergies   Amoxicillin-pot clavulanate, Beef-derived products, Pegademase bovine, and Penicillins   Review of Systems Review of Systems Per HPI  Physical Exam Triage Vital Signs ED Triage Vitals  Enc Vitals Group     BP 02/18/20 0953 (!) 105/92     Pulse Rate 02/18/20 0953 (!) 104     Resp 02/18/20 0953 19     Temp 02/18/20 0953 97.6 F (36.4 C)     Temp Source 02/18/20 0953 Oral     SpO2 02/18/20 0953 99 %     Weight --      Height --      Head Circumference --      Peak Flow --      Pain Score 02/18/20 0952 0     Pain Loc --      Pain Edu? --      Excl. in GC? --    Updated Vital Signs BP (!) 105/92 (BP Location: Left Arm)   Pulse (!) 104   Temp 97.6 F (36.4 C) (Oral)   Resp 19   SpO2 99%   Visual Acuity Right Eye Distance:   Left Eye Distance:   Bilateral Distance:    Right Eye Near:   Left Eye  Near:    Bilateral Near:     Physical Exam Constitutional:      General: She is not in acute distress.    Appearance: Normal appearance. She  is obese. She is not ill-appearing.  HENT:     Head: Normocephalic and atraumatic.     Mouth/Throat:     Pharynx: Oropharynx is clear. No oropharyngeal exudate or posterior oropharyngeal erythema.     Comments: Tongue appears normal.  Eyes:     General:        Right eye: No discharge.        Left eye: No discharge.     Conjunctiva/sclera: Conjunctivae normal.  Cardiovascular:     Rate and Rhythm: Normal rate and regular rhythm.     Heart sounds: No murmur heard.   Pulmonary:     Effort: Pulmonary effort is normal.     Breath sounds: Normal breath sounds. No wheezing, rhonchi or rales.  Neurological:     Mental Status: She is alert.  Psychiatric:        Mood and Affect: Mood normal.        Behavior: Behavior normal.    UC Treatments / Results  Labs (all labs ordered are listed, but only abnormal results are displayed) Labs Reviewed  RESP PANEL BY RT-PCR (FLU A&B, COVID) [id number]    EKG   Radiology DG Chest 2 View  Result Date: 02/18/2020 CLINICAL DATA:  Pt is here with throat pain and bilateral eye irritation that started 02/07/2020, pt has taken eye drop to relieve discomfort EXAM: CHEST - 2 VIEW COMPARISON:  11/30/2019 FINDINGS: The heart size and mediastinal contours are within normal limits. Both lungs are clear. No pleural effusion or pneumothorax. The visualized skeletal structures are intact. IMPRESSION: No active cardiopulmonary disease. Electronically Signed   By: Amie Portland M.D.   On: 02/18/2020 10:34    Procedures Procedures (including critical care time)  Medications Ordered in UC Medications - No data to display  Initial Impression / Assessment and Plan / UC Course  I have reviewed the triage vital signs and the nursing notes.  Pertinent labs & imaging results that were available during my care of the  patient were reviewed by me and considered in my medical decision making (see chart for details).    69 year old female presents with allergic conjunctivitis and sore throat.  Her exam is unremarkable.  Treating with azelastine eyedrops.  Patient also complained of left "lung pain".  Has a history of pleural effusion so x-ray was obtained.  X-ray was independently reviewed by me.  Interpretation: No acute findings.  No pleural effusion.  Covid and flu testing negative today.  Supportive care.  Final Clinical Impressions(s) / UC Diagnoses   Final diagnoses:  Allergic conjunctivitis of both eyes  Sore throat  History of pleural effusion     Discharge Instructions     I have sent in medication regarding your eyes. If symptoms persist see an eye doctor. I recommend [facility].  COVID and flu testing negative.  Chest xray was normal today. No pleural effusion.  Take care  Dr. Adriana Simas      ED Prescriptions    Medication Sig Dispense Auth. Provider   azelastine (OPTIVAR) 0.05 % ophthalmic solution Place 1 drop into both eyes 2 (two) times daily. 6 mL Tommie Sams, DO     PDMP not reviewed this encounter.   Tommie Sams, [place] 02/18/20 1115

## 2020-02-18 NOTE — ED Triage Notes (Signed)
Pt is here with throat pain and bilateral eye irritation that started 02/07/2020, pt has taken eye drop to relieve discomfort.

## 2020-03-03 ENCOUNTER — Encounter: Payer: Self-pay | Admitting: Internal Medicine

## 2020-03-03 NOTE — Telephone Encounter (Signed)
Please review.  KP

## 2020-03-03 NOTE — Telephone Encounter (Signed)
Pt response.  KP

## 2020-03-08 ENCOUNTER — Ambulatory Visit: Payer: Medicare Other

## 2020-03-20 ENCOUNTER — Other Ambulatory Visit: Payer: Self-pay

## 2020-03-20 ENCOUNTER — Ambulatory Visit (INDEPENDENT_AMBULATORY_CARE_PROVIDER_SITE_OTHER): Payer: Medicare Other | Admitting: Internal Medicine

## 2020-03-20 ENCOUNTER — Encounter: Payer: Self-pay | Admitting: Internal Medicine

## 2020-03-20 VITALS — BP 100/64 | HR 84 | Temp 97.5°F | Ht 67.0 in | Wt 247.0 lb

## 2020-03-20 DIAGNOSIS — G5601 Carpal tunnel syndrome, right upper limb: Secondary | ICD-10-CM

## 2020-03-20 DIAGNOSIS — I700 Atherosclerosis of aorta: Secondary | ICD-10-CM

## 2020-03-20 DIAGNOSIS — M797 Fibromyalgia: Secondary | ICD-10-CM

## 2020-03-20 DIAGNOSIS — I6789 Other cerebrovascular disease: Secondary | ICD-10-CM | POA: Diagnosis not present

## 2020-03-20 DIAGNOSIS — F3341 Major depressive disorder, recurrent, in partial remission: Secondary | ICD-10-CM

## 2020-03-20 DIAGNOSIS — M5432 Sciatica, left side: Secondary | ICD-10-CM

## 2020-03-20 MED ORDER — PREGABALIN 50 MG PO CAPS: 50.0000 mg | ORAL_CAPSULE | Freq: Three times a day (TID) | ORAL | 1 refills | Status: DC

## 2020-03-20 MED ORDER — ATORVASTATIN CALCIUM 10 MG PO TABS: 10.0000 mg | ORAL_TABLET | Freq: Every day | ORAL | 1 refills | Status: DC

## 2020-03-20 NOTE — Patient Instructions (Signed)
Hold atorvastatin until you start Lyrica.  Take one Lyrica a day for several days, then 2 a day for several days then three per day.  Follow up in 2 months.

## 2020-03-20 NOTE — Progress Notes (Signed)
Date:  03/20/2020   Name:  Ann Peters   DOB:  02-Apr-1950   MRN:  [id number]   Chief Complaint: fibromyalgia pain  (Arms, legs, feet, takes Cymbalta, B12 and B complex, magnesium, Pins and needs, went to neurologist said it was neuropathy ) and Hip Pain Larey Seat 12/03/2019, pain in hip, pt has artificial hip, right hip )  HPI Fibromyalgia - she has been taking cymbalta 120 mg per day.    Tingling - she was seen by neurology.  Extensive labs did not reveal a metabolic cause.  MRI brain showed no MS but did show chronic microvascular changes. EMG/NCS cow with CTS on the right but no other abnormality.  She was told by Neuro that she had neuropathy and to follow up with PCP.  Sciatic pain - she fell in the fall on her left hip.  She also had THA on the left in 2017.  She is noticing discomfort at the left sciatic notch.  She is concerned that she damaged the prosthesis or fractured her pelvis.    Lab Results  Component Value Date   CREATININE 1.20 (H) 09/14/2019   BUN 15 09/13/2019   NA 139 09/13/2019   K 4.0 09/13/2019   CL 105 09/13/2019   CO2 26 09/13/2019   Lab Results  Component Value Date   CHOL 173 11/19/2013   HDL 51 11/19/2013   LDLCALC 100 11/19/2013   TRIG 111 11/19/2013   Lab Results  Component Value Date   TSH 3.710 11/17/2018   Lab Results  Component Value Date   HGBA1C 5.8 (H) 06/07/2019   Lab Results  Component Value Date   WBC 5.2 06/07/2019   HGB 12.8 06/07/2019   HCT 37.6 06/07/2019   MCV 91 06/07/2019   PLT 313 06/07/2019   Lab Results  Component Value Date   ALT 13 06/07/2019   AST 19 06/07/2019   ALKPHOS 98 06/07/2019   BILITOT 0.4 06/07/2019     Review of Systems  Constitutional: Negative for chills, fatigue and fever.  Respiratory: Negative for chest tightness and shortness of breath.   Cardiovascular: Negative for chest pain and palpitations.  Musculoskeletal: Positive for back pain (left sciatic notch pain), gait problem, joint  swelling and myalgias (and tingling in arms and legs).  Neurological: Positive for numbness (in arms and legs). Negative for dizziness, tremors, seizures, weakness and light-headedness.  Psychiatric/Behavioral: Negative for dysphoric mood and sleep disturbance. The patient is not nervous/anxious.     Patient Active Problem List   Diagnosis Date Noted  . Cerebral microvascular disease 11/10/2019  . Pleural effusion, left 10/19/2019  . Aortic atherosclerosis (HCC) 10/08/2019  . Personal history of colonic polyps   . Anemia 02/17/2017  . Insomnia 11/05/2016  . Osteoarthritis of left hip 10/10/2015  . Status post left hip replacement 10/10/2015  . Onychomycosis of left great toe 05/10/2015  . Prediabetes 01/03/2015  . Anxiety 09/01/2014  . Diverticulitis 09/01/2014  . Vitamin D deficiency 09/01/2014  . Herpes zoster 09/01/2014  . Fibromyalgia 11/15/2013  . Recurrent major depressive disorder, in partial remission (HCC) 11/15/2013  . Vitamin B12 deficiency 12/23/2011  . Allergic rhinitis 04/18/2011    Allergies  Allergen Reactions  . Amoxicillin-Pot Clavulanate Nausea And Vomiting and Other (See Comments)    Other reaction(s): Vomiting  . Beef-Derived Products Nausea And Vomiting  . Pegademase Bovine Nausea And Vomiting  . Penicillins Nausea And Vomiting and Other (See Comments)    Has patient had a  PCN reaction causing immediate rash, facial/tongue/throat swelling, SOB or lightheadedness with hypotension: no Has patient had a PCN reaction causing severe rash involving mucus membranes or skin necrosis: No Has patient had a PCN reaction that required hospitalization No Has patient had a PCN reaction occurring within the last 10 years: Yes If all of the above answers are "NO", then may proceed with Cephalosporin use.         Past Surgical History:  Procedure Laterality Date  . BLADDER REPAIR    . COLON SURGERY  2011   10 inches removed for benign mass, does not absorb  nutrients as well  . COLONOSCOPY  2011   polyp removed- Dr Marva Panda  . COLONOSCOPY WITH PROPOFOL N/A 07/04/2017   Procedure: COLONOSCOPY WITH PROPOFOL;  Surgeon: Midge Minium, MD;  Location: [facility];  Service: Endoscopy;  Laterality: N/A;  recurrent shingles  . NASAL SINUS SURGERY    . TONSILLECTOMY    . TOTAL HIP ARTHROPLASTY Left 10/10/2015   Procedure: LEFT TOTAL HIP ARTHROPLASTY ANTERIOR APPROACH;  Surgeon: Kathryne Hitch, MD;  [facility]: [facility];  Service: Orthopedics;  Laterality: Left;  . TRIGGER FINGER RELEASE Left 03/28/2017   Procedure: MIDDLE FINGER DUPREYTRENS RELEASE;  Surgeon: Erin Sons, MD;  Location: [facility];  Service: Orthopedics;  Laterality: Left;  . TUBAL LIGATION    . TURBINATE REDUCTION Bilateral 04/29/2019   Procedure: TURBINATE REDUCTION WITH VIVEAR;  Surgeon: Vernie Murders, MD;  Location: [facility];  Service: ENT;  Laterality: Bilateral;  vivear  . VAGINAL HYSTERECTOMY     partial    Social History   Tobacco Use  . Smoking status: Never Smoker  . Smokeless tobacco: Never Used  Vaping Use  . Vaping Use: Never used  Substance Use Topics  . Alcohol use: No    Alcohol/week: 0.0 standard drinks  . Drug use: No     Medication list has been reviewed and updated.  Current Meds  Medication Sig  . azelastine (OPTIVAR) 0.05 % ophthalmic solution Place 1 drop into both eyes 2 (two) times daily.  . Cholecalciferol (VITAMIN D3) 5000 UNITS CAPS Take 1 capsule (5,000 Units total) by mouth daily.  . Cyanocobalamin (VITAMIN B-12) 5000 MCG SUBL Place 15,000 mcg under the tongue daily.  . DULoxetine (CYMBALTA) 60 MG capsule TAKE 2 CAPSULES BY MOUTH DAILY  . fluticasone (FLONASE) 50 MCG/ACT nasal spray Place 2 sprays into both nostrils daily.  Marland Kitchen MAGNESIUM PO Take by mouth daily.  . Multiple Vitamins-Minerals (MULTIVITAMIN WOMEN 50+) TABS Take 1 tablet by mouth daily.  . sodium chloride (OCEAN) 0.65 % SOLN nasal spray Place 1  spray into both nostrils as needed for congestion.  . traZODone (DESYREL) 100 MG tablet TAKE 2 TABLETS(200 MG) BY MOUTH AT BEDTIME  . valACYclovir (VALTREX) 1000 MG tablet TAKE 1 TABLET BY MOUTH EVERY DAY (Patient taking differently: as needed.)    PHQ 2/9 Scores 03/20/2020 11/10/2019 06/07/2019 03/08/2019  PHQ - 2 Score 0 0 2 0  PHQ- 9 Score 0 0 11 -    GAD 7 : Generalized Anxiety Score 03/20/2020 11/10/2019 06/07/2019 11/17/2018  Nervous, Anxious, on Edge 0 0 0 0  Control/stop worrying 0 0 0 0  Worry too much - different things 0 0 0 0  Trouble relaxing 0 0 2 0  Restless 0 0 1 0  Easily annoyed or irritable 0 0 0 0  Afraid - awful might happen 0 0 0 0  Total GAD 7 Score 0 0  3 0  Anxiety Difficulty - Not difficult at all Not difficult at all -    BP Readings from Last 3 Encounters:  03/20/20 100/64  02/18/20 (!) 105/92  11/30/19 114/78    Physical Exam Vitals and nursing note reviewed.  Constitutional:      General: She is not in acute distress.    Appearance: She is well-developed. She is obese.  HENT:     Head: Normocephalic and atraumatic.  Cardiovascular:     Rate and Rhythm: Normal rate and regular rhythm.     Pulses: Normal pulses.     Heart sounds: No murmur heard.   Pulmonary:     Effort: Pulmonary effort is normal. No respiratory distress.     Breath sounds: No wheezing or rhonchi.  Musculoskeletal:     Cervical back: Normal range of motion.     Lumbar back: Tenderness: over left sciatic notch.     Right lower leg: No edema.     Left lower leg: No edema.  Lymphadenopathy:     Cervical: No cervical adenopathy.  Skin:    General: Skin is warm and dry.     Capillary Refill: Capillary refill takes less than 2 seconds.     Findings: No rash.  Neurological:     Mental Status: She is alert and oriented to person, place, and time.  Psychiatric:        Mood and Affect: Mood and affect and mood normal.     Wt Readings from Last 3 Encounters:  03/20/20 247 lb  (112 kg)  11/30/19 248 lb 9.6 oz (112.8 kg)  11/10/19 244 lb (110.7 kg)    BP 100/64   Pulse 84   Temp (!) 97.5 F (36.4 C) (Oral)   Ht 5\' 7"  (1.702 m)   Wt 247 lb (112 kg)   SpO2 95%   BMI 38.69 kg/m   Assessment and Plan: 1. Fibromyalgia Continue Cymbalta 120 mg per day Add Lyrica - titrate up slowly to 50 mg tid - pregabalin (LYRICA) 50 MG capsule; Take 1 capsule (50 mg total) by mouth 3 (three) times daily.  Dispense: 90 capsule; Refill: 1  2. Sciatica of left side Should benefit from Lyrica No evidence of prothesis pain or pelvic fracture  3. Cerebral microvascular disease Recommend starting statin therapy to avoid progression - start several weeks after beginning Lyrica - atorvastatin (LIPITOR) 10 MG tablet; Take 1 tablet (10 mg total) by mouth daily.  Dispense: 90 tablet; Refill: 1  4. Aortic atherosclerosis (HCC) - atorvastatin (LIPITOR) 10 MG tablet; Take 1 tablet (10 mg total) by mouth daily.  Dispense: 90 tablet; Refill: 1  5. Recurrent major depressive disorder, in partial remission (HCC) Clinically stable on current regimen with good control of symptoms, No SI or HI. Will continue current therapy with Cymbalta and trazodone  6. Carpal tunnel syndrome on right By NCS - appears to be minimal   Partially dictated using . Any errors are unintentional.  Animal nutritionist, MD [facility]  03/20/2020

## 2020-03-22 ENCOUNTER — Ambulatory Visit: Payer: Medicare Other | Admitting: Physician Assistant

## 2020-03-23 ENCOUNTER — Encounter: Payer: Self-pay | Admitting: Physician Assistant

## 2020-03-23 ENCOUNTER — Ambulatory Visit (INDEPENDENT_AMBULATORY_CARE_PROVIDER_SITE_OTHER): Payer: Medicare Other

## 2020-03-23 ENCOUNTER — Ambulatory Visit (INDEPENDENT_AMBULATORY_CARE_PROVIDER_SITE_OTHER): Payer: Medicare Other | Admitting: Physician Assistant

## 2020-03-23 DIAGNOSIS — M5442 Lumbago with sciatica, left side: Secondary | ICD-10-CM | POA: Diagnosis not present

## 2020-03-23 DIAGNOSIS — M25552 Pain in left hip: Secondary | ICD-10-CM | POA: Diagnosis not present

## 2020-03-23 MED ORDER — METHYLPREDNISOLONE 4 MG PO TABS: ORAL_TABLET | ORAL | 0 refills | Status: DC

## 2020-03-23 MED ORDER — METHOCARBAMOL 500 MG PO TABS
500.0000 mg | ORAL_TABLET | Freq: Three times a day (TID) | ORAL | 1 refills | Status: DC
Start: 2020-03-23 — End: 2020-07-12

## 2020-03-23 MED ORDER — METHOCARBAMOL 500 MG PO TABS: 500.0000 mg | ORAL_TABLET | Freq: Three times a day (TID) | ORAL | 1 refills | Status: DC

## 2020-03-23 NOTE — Progress Notes (Signed)
Office Visit Note   Patient: Ann Peters           Date of Birth: 01-28-1951           MRN: [id number] Visit Date: 03/23/2020              Requested by: Reubin Milan, MD [address] [place],  [place] [address] PCP: Reubin Milan, MD   Assessment & Plan: Visit Diagnoses:  1. Left-sided low back pain with left-sided sciatica, unspecified chronicity   2. Pain in left hip     Plan: We will send her to formal physical therapy for hamstring stretching, IT band stretching, home exercise program, core strengthening, and modalities.  Placed her on a Medrol Dosepak and Robaxin.  She is to take no NSAIDs while she is on the Medrol Dosepak.  See her back in 3 weeks sooner if her symptoms become worse.  Did discuss reasons for her to return sooner such as saddle anesthesia like symptoms bowel bladder incontinence or worsening radicular symptoms down either leg.  Follow-Up Instructions: Return in about 3 weeks (around 04/13/2020).   Orders:  Orders Placed This Encounter  Procedures  . XR Lumbar Spine 2-3 Views  . XR HIP UNILAT W OR W/O PELVIS 2-3 VIEWS LEFT   Meds ordered this encounter  Medications  . methylPREDNISolone (MEDROL) 4 MG tablet    Sig: Take as directed    Dispense:  21 tablet    Refill:  0  . DISCONTD: methocarbamol (ROBAXIN) 500 MG tablet    Sig: Take 1 tablet (500 mg total) by mouth 3 (three) times daily.    Dispense:  40 tablet    Refill:  1  . methocarbamol (ROBAXIN) 500 MG tablet    Sig: Take 1 tablet (500 mg total) by mouth 3 (three) times daily.    Dispense:  40 tablet    Refill:  1      Procedures: No procedures performed   Clinical Data: No additional findings.   Subjective: Chief Complaint  Patient presents with  . Left Hip - Pain  . Lower Back - Pain    HPI Ann Peters is well-known to Dr. Magnus Ivan service comes in today due to left hip and low back pain.  She reports in late October early November she fell down some steps  and had back pain and hip pain since then.  It runs down to her left knee.  She was due to get an MRI of her right knee due to the right hand giving way on her.  However she has not had this performed.  She states the right knee continues to bother her but currently the pain in the low back and into the left hip near the surgical incision where she had a left total hip arthroplasty performed is more bothersome.  She has tried fish oil which is helped some with the pain.  She has recently been put on Lyrica is not started taking this for her neuropathy.  She is nondiabetic.  She also is taking some over-the-counter NSAIDs without any real relief.  She rates her pain in the low back area and hip to be 8 out of 10 at worst.  She denies any saddle anesthesia like symptoms bowel or bladder dysfunction.  Pain does awaken her at night.  Denies any fevers chills shortness breath chest pain  Review of Systems See HPI  Objective: Vital Signs: There were no vitals taken for this  visit.  Physical Exam General: Well-developed well-nourished female in no acute distress has difficulty getting up and down from a seated position. Psych: Alert and oriented x3 Vascular: Dorsal pedal pulses are present bilaterally.  Calf supple nontender bilaterally Ortho Exam Lumbar spine she has tenderness over the lower left paraspinous region.  Positive straight leg raise on the left negative on the right.  Tight hamstrings bilaterally.  Out of 5 strength throughout the lower extremities against resistance.  Deep tendon reflexes are 2+ at the knees and ankles and equal and symmetric.  She has limited forward flexion extension lumbar spine. Specialty Comments:  No specialty comments available.  Imaging: XR HIP UNILAT W OR W/O PELVIS 2-3 VIEWS LEFT  Result Date: 03/23/2020 AP pelvis lateral view of the left hip: Left total hip arthroplasty components well-seated.  No evidence of hardware failure.  Bilateral hips well located.  No  acute fractures or bony abnormalities otherwise.  XR Lumbar Spine 2-3 Views  Result Date: 03/23/2020 Lumbar spine 2 views: Images are basically unchanged from 2019 films.  Scoliosis remains unchanged.  There is disc space narrowing at L1-2 through L4.  Facet changes mid to lower lumbar spine.  No acute fractures.  No spondylolisthesis.    PMFS History: Patient Active Problem List   Diagnosis Date Noted  . Carpal tunnel syndrome on right 03/20/2020  . Cerebral microvascular disease 11/10/2019  . Pleural effusion, left 10/19/2019  . Aortic atherosclerosis (HCC) 10/08/2019  . Personal history of colonic polyps   . Insomnia 11/05/2016  . Osteoarthritis of left hip 10/10/2015  . Status post left hip replacement 10/10/2015  . Prediabetes 01/03/2015  . Anxiety 09/01/2014  . Vitamin D deficiency 09/01/2014  . Herpes zoster 09/01/2014  . Fibromyalgia 11/15/2013  . Recurrent major depressive disorder, in partial remission (HCC) 11/15/2013  . Vitamin B12 deficiency 12/23/2011  . Allergic rhinitis 04/18/2011   Past Medical History:  Diagnosis Date  . Allergy   . Anemia 02/17/2017  . Anxiety   . Depression   . Diverticulitis 09/01/2014  . History of shingles    ON AND OFF 6 YEARS, recurrent episodes of internal and external shingles with resulting neuralgia  . Neuromuscular disorder (HCC)    POSTHERPETIC NEURALGIA, left wrist weakness  . Pneumonia    HX   SEVERAL TIMES  NONE IN 3 YRS  . Post herpetic neuralgia   . Scoliosis   . Shingles    history of.  Recurrent  . Sinusitis 10/18/2016    Family History  Problem Relation Age of Onset  . Lymphoma Mother   . Sarcoidosis Mother   . CAD Father   . Breast cancer Sister 8  . Brain cancer Sister     Past Surgical History:  Procedure Laterality Date  . BLADDER REPAIR    . COLON SURGERY  2011   10 inches removed for benign mass, does not absorb nutrients as well  . COLONOSCOPY  2011   polyp removed- Dr Marva Panda  . COLONOSCOPY  WITH PROPOFOL N/A 07/04/2017   Procedure: COLONOSCOPY WITH PROPOFOL;  Surgeon: Midge Minium, MD;  Location: [facility];  Service: Endoscopy;  Laterality: N/A;  recurrent shingles  . NASAL SINUS SURGERY    . TONSILLECTOMY    . TOTAL HIP ARTHROPLASTY Left 10/10/2015   Procedure: LEFT TOTAL HIP ARTHROPLASTY ANTERIOR APPROACH;  Surgeon: Kathryne Hitch, MD;  Location: [facility] OR;  Service: Orthopedics;  Laterality: Left;  . TRIGGER FINGER RELEASE Left 03/28/2017   Procedure: MIDDLE FINGER  DUPREYTRENS RELEASE;  Surgeon: Erin Sons, MD;  Location: [facility];  Service: Orthopedics;  Laterality: Left;  . TUBAL LIGATION    . TURBINATE REDUCTION Bilateral 04/29/2019   Procedure: TURBINATE REDUCTION WITH VIVEAR;  Surgeon: Vernie Murders, MD;  Location: [facility];  Service: ENT;  Laterality: Bilateral;  vivear  . VAGINAL HYSTERECTOMY     partial   Social History   Occupational History  . Occupation: Retired  Tobacco Use  . Smoking status: Never Smoker  . Smokeless tobacco: Never Used  Vaping Use  . Vaping Use: Never used  Substance and Sexual Activity  . Alcohol use: No    Alcohol/week: 0.0 standard drinks  . Drug use: No  . Sexual activity: Yes

## 2020-03-30 DIAGNOSIS — M545 Low back pain, unspecified: Secondary | ICD-10-CM | POA: Diagnosis not present

## 2020-03-30 DIAGNOSIS — M5459 Other low back pain: Secondary | ICD-10-CM | POA: Diagnosis not present

## 2020-04-05 DIAGNOSIS — M5459 Other low back pain: Secondary | ICD-10-CM | POA: Diagnosis not present

## 2020-04-05 DIAGNOSIS — M545 Low back pain, unspecified: Secondary | ICD-10-CM | POA: Diagnosis not present

## 2020-04-07 DIAGNOSIS — M5459 Other low back pain: Secondary | ICD-10-CM | POA: Diagnosis not present

## 2020-04-07 DIAGNOSIS — M545 Low back pain, unspecified: Secondary | ICD-10-CM | POA: Diagnosis not present

## 2020-04-11 ENCOUNTER — Telehealth: Payer: Self-pay | Admitting: Internal Medicine

## 2020-04-11 NOTE — Telephone Encounter (Signed)
Left message for patient to call back and schedule Medicare Annual Wellness Visit (AWV) either virtually or in office. Whichever the patients preference is.  Last AWV 03/08/19; please schedule at anytime with [facility] Health Advisor.  This should be a 40 minute visit.

## 2020-04-12 DIAGNOSIS — M545 Low back pain, unspecified: Secondary | ICD-10-CM | POA: Diagnosis not present

## 2020-04-12 DIAGNOSIS — M5459 Other low back pain: Secondary | ICD-10-CM | POA: Diagnosis not present

## 2020-04-13 ENCOUNTER — Ambulatory Visit (INDEPENDENT_AMBULATORY_CARE_PROVIDER_SITE_OTHER): Payer: Medicare Other | Admitting: Physician Assistant

## 2020-04-13 ENCOUNTER — Encounter: Payer: Self-pay | Admitting: Physician Assistant

## 2020-04-13 DIAGNOSIS — M5442 Lumbago with sciatica, left side: Secondary | ICD-10-CM

## 2020-04-13 NOTE — Progress Notes (Signed)
Office Visit Note   Patient: Ann Peters           Date of Birth: Feb 17, 1951           MRN: [id number] Visit Date: 04/13/2020              Requested by: Reubin Milan, MD [address] [place],  [place] [address] PCP: Reubin Milan, MD   Assessment & Plan: Visit Diagnoses:  1. Left-sided low back pain with left-sided sciatica, unspecified chronicity     Plan: We will obtain an MRI of her lumbar spine to rule out HNP as a source of her radicular symptoms down her left leg.  She has failed conservative treatment which is included medications and physical therapy.  She will follow up after the MRI to go over results discuss further treatment.  Follow-Up Instructions: Return After MRI.   Orders:  No orders of the defined types were placed in this encounter.  No orders of the defined types were placed in this encounter.     Procedures: No procedures performed   Clinical Data: No additional findings.   Subjective: Chief Complaint  Patient presents with  . Left Hip - Pain, Follow-up  . Lower Back - Pain, Follow-up    HPI Ann Peters returns today follow-up of her low back pain and radicular symptoms down the left leg.  She is not sure that the PT helped.  Some of the exercises seem to relieve pain for short period time but her pain does continue to come back.  She also is on the Dosepak and Robaxin.  She states her Robaxin helps before physical therapy and at night.  She feels therapy made it actually made her pain worse.  She is questioning whether she has "internal shingles virus causing her pain.  She states that she has pain in her back when standing.  She has difficulty getting up from a lying down position due to the pain in her back.  Is having no bowel bladder dysfunction.  No saddle anesthesia like symptoms. Review of Systems No fevers or chills.  Objective: Vital Signs: There were no vitals taken for this visit.  Physical Exam Constitutional:       Appearance: She is not ill-appearing or diaphoretic.  Pulmonary:     Effort: Pulmonary effort is normal.  Neurological:     Mental Status: She is alert and oriented to person, place, and time.  Psychiatric:        Mood and Affect: Mood normal.     Ortho Exam Lower extremities 5 out of 5 strength throughout lower extremities against resistance.  Good range of motion bilateral hips without pain.  Positive straight leg raise on the left negative on the right.  Full forward flexion.  Extension lumbar spine with discomfort.  Tenderness over the left lower lumbar paraspinous region. Specialty Comments:  No specialty comments available.  Imaging: No results found.   PMFS History: Patient Active Problem List   Diagnosis Date Noted  . Carpal tunnel syndrome on right 03/20/2020  . Cerebral microvascular disease 11/10/2019  . Pleural effusion, left 10/19/2019  . Aortic atherosclerosis (HCC) 10/08/2019  . Personal history of colonic polyps   . Insomnia 11/05/2016  . Osteoarthritis of left hip 10/10/2015  . Status post left hip replacement 10/10/2015  . Prediabetes 01/03/2015  . Anxiety 09/01/2014  . Vitamin D deficiency 09/01/2014  . Herpes zoster 09/01/2014  . Fibromyalgia 11/15/2013  . Recurrent major depressive  disorder, in partial remission (HCC) 11/15/2013  . Vitamin B12 deficiency 12/23/2011  . Allergic rhinitis 04/18/2011   Past Medical History:  Diagnosis Date  . Allergy   . Anemia 02/17/2017  . Anxiety   . Depression   . Diverticulitis 09/01/2014  . History of shingles    ON AND OFF 6 YEARS, recurrent episodes of internal and external shingles with resulting neuralgia  . Neuromuscular disorder (HCC)    POSTHERPETIC NEURALGIA, left wrist weakness  . Pneumonia    HX   SEVERAL TIMES  NONE IN 3 YRS  . Post herpetic neuralgia   . Scoliosis   . Shingles    history of.  Recurrent  . Sinusitis 10/18/2016    Family History  Problem Relation Age of Onset  . Lymphoma  Mother   . Sarcoidosis Mother   . CAD Father   . Breast cancer Sister 75  . Brain cancer Sister     Past Surgical History:  Procedure Laterality Date  . BLADDER REPAIR    . COLON SURGERY  2011   10 inches removed for benign mass, does not absorb nutrients as well  . COLONOSCOPY  2011   polyp removed- Dr Marva Panda  . COLONOSCOPY WITH PROPOFOL N/A 07/04/2017   Procedure: COLONOSCOPY WITH PROPOFOL;  Surgeon: Midge Minium, MD;  Location: [facility];  Service: Endoscopy;  Laterality: N/A;  recurrent shingles  . NASAL SINUS SURGERY    . TONSILLECTOMY    . TOTAL HIP ARTHROPLASTY Left 10/10/2015   Procedure: LEFT TOTAL HIP ARTHROPLASTY ANTERIOR APPROACH;  Surgeon: Kathryne Hitch, MD;  Location: [facility];  Service: Orthopedics;  Laterality: Left;  . TRIGGER FINGER RELEASE Left 03/28/2017   Procedure: MIDDLE FINGER DUPREYTRENS RELEASE;  Surgeon: Erin Sons, MD;  Location: [facility];  Service: Orthopedics;  Laterality: Left;  . TUBAL LIGATION    . TURBINATE REDUCTION Bilateral 04/29/2019   Procedure: TURBINATE REDUCTION WITH VIVEAR;  Surgeon: Vernie Murders, MD;  Location: [facility];  Service: ENT;  Laterality: Bilateral;  vivear  . VAGINAL HYSTERECTOMY     partial   Social History   Occupational History  . Occupation: Retired  Tobacco Use  . Smoking status: Never Smoker  . Smokeless tobacco: Never Used  Vaping Use  . Vaping Use: Never used  Substance and Sexual Activity  . Alcohol use: No    Alcohol/week: 0.0 standard drinks  . Drug use: No  . Sexual activity: Yes

## 2020-04-14 NOTE — Addendum Note (Signed)
Addended by: Barbette Or on: 04/14/2020 08:38 AM   Modules accepted: Orders

## 2020-04-27 ENCOUNTER — Other Ambulatory Visit: Payer: Self-pay | Admitting: Internal Medicine

## 2020-04-27 ENCOUNTER — Telehealth: Payer: Self-pay | Admitting: Orthopaedic Surgery

## 2020-04-27 DIAGNOSIS — G4700 Insomnia, unspecified: Secondary | ICD-10-CM

## 2020-04-27 NOTE — Telephone Encounter (Signed)
Called pt 1X to set MRI review appt with Dr. Magnus Ivan or PA Chestine Spore after 05/02/20. Will try again later

## 2020-04-28 ENCOUNTER — Telehealth: Payer: Self-pay | Admitting: Orthopaedic Surgery

## 2020-04-28 NOTE — Telephone Encounter (Signed)
Left 2x vm for pt to call and set MRI review appt with Dr.Blackman or PA Clark after 05/02/20. Will try again later

## 2020-05-02 ENCOUNTER — Ambulatory Visit
Admission: RE | Admit: 2020-05-02 | Discharge: 2020-05-02 | Disposition: A | Discharge status: Home / Self Care | Payer: Medicare Other | Source: Ambulatory Visit | Attending: Physician Assistant | Admitting: Physician Assistant

## 2020-05-02 ENCOUNTER — Other Ambulatory Visit: Payer: Self-pay

## 2020-05-02 DIAGNOSIS — M545 Low back pain, unspecified: Secondary | ICD-10-CM | POA: Diagnosis not present

## 2020-05-02 DIAGNOSIS — M5442 Lumbago with sciatica, left side: Secondary | ICD-10-CM | POA: Diagnosis not present

## 2020-05-15 ENCOUNTER — Ambulatory Visit (INDEPENDENT_AMBULATORY_CARE_PROVIDER_SITE_OTHER): Payer: Medicare Other | Admitting: Orthopaedic Surgery

## 2020-05-15 ENCOUNTER — Encounter: Payer: Self-pay | Admitting: Orthopaedic Surgery

## 2020-05-15 DIAGNOSIS — M5442 Lumbago with sciatica, left side: Secondary | ICD-10-CM

## 2020-05-15 NOTE — Progress Notes (Signed)
The patient comes in today to go over the MRI of the lumbar spine.  She had been having low back pain after a fall a few months ago.  Is mainly been to the left side and into the sciatic region.  She also has had shingles in the past and which she describes as "internal shingles".  She decided to start back on Valtrex and this did help her some as well.  She still having some low back pain but not the radicular component.  This is to the left side.  There MRI is reviewed with her husband.  It does show moderate to severe stenosis at L4-L5 there is multifactorial.  There is significant facet joint arthritis and this is causing some significant foraminal stenosis to the left side at that side.  Since she is doing so much better we would not recommend any other intervention for now.  However, if the pain flares up significantly we would send her directly to Dr. Alvester Morin for a left-sided ESI at L4-L5 versus a facet joint injection at that area depending on her symptoms.  This would be the next appropriate step.  She will continue her Valtrex as needed and stretching exercises as well.  All questions and concerns were answered and addressed.

## 2020-05-17 ENCOUNTER — Other Ambulatory Visit: Payer: Self-pay | Admitting: Internal Medicine

## 2020-05-17 DIAGNOSIS — M797 Fibromyalgia: Secondary | ICD-10-CM

## 2020-05-17 NOTE — Telephone Encounter (Signed)
Requested Prescriptions  Pending Prescriptions Disp Refills  . DULoxetine (CYMBALTA) 60 MG capsule [Pharmacy Med Name: DULOXETINE DR 60MG  CAPSULES] 180 capsule 0    Sig: TAKE 2 CAPSULES BY MOUTH DAILY     Psychiatry: Antidepressants - SNRI Passed - 05/17/2020  6:22 AM      Passed - Completed PHQ-2 or PHQ-9 in the last 360 days      Passed - Last BP in normal range    BP Readings from Last 1 Encounters:  03/20/20 100/64         Passed - Valid encounter within last 6 months    Recent Outpatient Visits          1 month ago Fibromyalgia   [facility], [facility]   6 months ago Pleural effusion, left   [facility], MD   11 months ago Recurrent major depressive disorder, in partial remission [facility])   [facility], MD   1 year ago Prediabetes   [facility], MD   1 year ago Insomnia, unspecified type   [facility], MD      Future Appointments            In 5 days Reubin Milan, MD [facility], [facility]

## 2020-05-22 ENCOUNTER — Other Ambulatory Visit: Payer: Self-pay

## 2020-05-22 ENCOUNTER — Encounter: Payer: Self-pay | Admitting: Internal Medicine

## 2020-05-22 ENCOUNTER — Ambulatory Visit (INDEPENDENT_AMBULATORY_CARE_PROVIDER_SITE_OTHER): Payer: Medicare Other | Admitting: Internal Medicine

## 2020-05-22 VITALS — BP 138/68 | HR 58 | Temp 97.7°F | Ht 67.0 in | Wt 238.0 lb

## 2020-05-22 DIAGNOSIS — R7303 Prediabetes: Secondary | ICD-10-CM | POA: Diagnosis not present

## 2020-05-22 DIAGNOSIS — M797 Fibromyalgia: Secondary | ICD-10-CM

## 2020-05-22 DIAGNOSIS — I700 Atherosclerosis of aorta: Secondary | ICD-10-CM | POA: Diagnosis not present

## 2020-05-22 NOTE — Progress Notes (Signed)
Date:  05/22/2020   Name:  Ann Peters   DOB:  [birth year]   MRN:  [id number]   Chief Complaint: Fibromyalgia and Hyperlipidemia  Muscle Pain This is a chronic problem. The problem occurs daily. The problem has been gradually improving since onset. Pain location: back and hip. The pain is medium. Pertinent negatives include no chest pain, diarrhea, fatigue, fever, headaches, nausea, shortness of breath or vomiting. Treatments tried: Lyrica started last visit but stopped when she started PT/ on Cymbalta.  Hyperlipidemia This is a chronic problem. The problem is uncontrolled. Pertinent negatives include no chest pain or shortness of breath. Current antihyperlipidemic treatment includes statins (statin started last visit).  Diabetes She presents for her follow-up diabetic visit. Diabetes type: prediabetes. Her disease course has been stable. Pertinent negatives for hypoglycemia include no dizziness, headaches or nervousness/anxiousness. Pertinent negatives for diabetes include no chest pain and no fatigue. Current diabetic treatment includes diet.    Lab Results  Component Value Date   CREATININE 1.20 (H) 09/14/2019   BUN 15 09/13/2019   NA 139 09/13/2019   K 4.0 09/13/2019   CL 105 09/13/2019   CO2 26 09/13/2019   Lab Results  Component Value Date   CHOL 173 11/19/2013   HDL 51 11/19/2013   LDLCALC 100 11/19/2013   TRIG 111 11/19/2013   Lab Results  Component Value Date   TSH 3.710 11/17/2018   Lab Results  Component Value Date   HGBA1C 5.8 (H) 06/07/2019   Lab Results  Component Value Date   WBC 5.2 06/07/2019   HGB 12.8 06/07/2019   HCT 37.6 06/07/2019   MCV 91 06/07/2019   PLT 313 06/07/2019   Lab Results  Component Value Date   ALT 13 06/07/2019   AST 19 06/07/2019   ALKPHOS 98 06/07/2019   BILITOT 0.4 06/07/2019     Review of Systems  Constitutional: Negative for chills, fatigue and fever.  Respiratory: Negative for chest tightness and shortness of  breath.   Cardiovascular: Negative for chest pain and leg swelling.  Gastrointestinal: Negative for abdominal distention, diarrhea, nausea and vomiting.  Musculoskeletal: Positive for arthralgias, back pain and gait problem.  Neurological: Negative for dizziness, light-headedness and headaches.  Psychiatric/Behavioral: Negative for dysphoric mood and sleep disturbance. The patient is not nervous/anxious.     Patient Active Problem List   Diagnosis Date Noted  . Carpal tunnel syndrome on right 03/20/2020  . Cerebral microvascular disease 11/10/2019  . Pleural effusion, left 10/19/2019  . Aortic atherosclerosis (HCC) 10/08/2019  . Personal history of colonic polyps   . Insomnia 11/05/2016  . Osteoarthritis of left hip 10/10/2015  . Status post left hip replacement 10/10/2015  . Prediabetes 01/03/2015  . Anxiety 09/01/2014  . Vitamin D deficiency 09/01/2014  . Herpes zoster 09/01/2014  . Fibromyalgia 11/15/2013  . Recurrent major depressive disorder, in partial remission (HCC) 11/15/2013  . Vitamin B12 deficiency 12/23/2011  . Allergic rhinitis 04/18/2011    Allergies  Allergen Reactions  . Amoxicillin-Pot Clavulanate Nausea And Vomiting and Other (See Comments)    Other reaction(s): Vomiting  . Beef-Derived Products Nausea And Vomiting  . Pegademase Bovine Nausea And Vomiting  . Penicillins Nausea And Vomiting and Other (See Comments)    Has patient had a PCN reaction causing immediate rash, facial/tongue/throat swelling, SOB or lightheadedness with hypotension: no Has patient had a PCN reaction causing severe rash involving mucus membranes or skin necrosis: No Has patient had a PCN reaction that required hospitalization  No Has patient had a PCN reaction occurring within the last 10 years: Yes If all of the above answers are "NO", then may proceed with Cephalosporin use.         Past Surgical History:  Procedure Laterality Date  . BLADDER REPAIR    . COLON SURGERY   2011   10 inches removed for benign mass, does not absorb nutrients as well  . COLONOSCOPY  2011   polyp removed- Dr Marva Panda  . COLONOSCOPY WITH PROPOFOL N/A 07/04/2017   Procedure: COLONOSCOPY WITH PROPOFOL;  Surgeon: Midge Minium, MD;  Location: [facility];  Service: Endoscopy;  Laterality: N/A;  recurrent shingles  . NASAL SINUS SURGERY    . TONSILLECTOMY    . TOTAL HIP ARTHROPLASTY Left 10/10/2015   Procedure: LEFT TOTAL HIP ARTHROPLASTY ANTERIOR APPROACH;  Surgeon: Kathryne Hitch, MD;  [facility];  Service: Orthopedics;  Laterality: Left;  . TRIGGER FINGER RELEASE Left 03/28/2017   Procedure: MIDDLE FINGER DUPREYTRENS RELEASE;  Surgeon: Erin Sons, MD;  [facility]: [facility];  Service: Orthopedics;  Laterality: Left;  . TUBAL LIGATION    . TURBINATE REDUCTION Bilateral 04/29/2019   Procedure: TURBINATE REDUCTION WITH VIVEAR;  Surgeon: Vernie Murders, MD;  [facility]: [facility];  Service: ENT;  Laterality: Bilateral;  vivear  . VAGINAL HYSTERECTOMY     partial    Social History   Tobacco Use  . Smoking status: Never Smoker  . Smokeless tobacco: Never Used  Vaping Use  . Vaping Use: Never used  Substance Use Topics  . Alcohol use: No    Alcohol/week: 0.0 standard drinks  . Drug use: No     Medication list has been reviewed and updated.  Current Meds  Medication Sig  . atorvastatin (LIPITOR) 10 MG tablet Take 1 tablet (10 mg total) by mouth daily.  Marland Kitchen azelastine (OPTIVAR) 0.05 % ophthalmic solution Place 1 drop into both eyes 2 (two) times daily.  . Cholecalciferol (VITAMIN D3) 5000 UNITS CAPS Take 1 capsule (5,000 Units total) by mouth daily.  . Cyanocobalamin (VITAMIN B-12) 5000 MCG SUBL Place 15,000 mcg under the tongue daily.  . DULoxetine (CYMBALTA) 60 MG capsule TAKE 2 CAPSULES BY MOUTH DAILY  . fluticasone (FLONASE) 50 MCG/ACT nasal spray Place 2 sprays into both nostrils daily.  Marland Kitchen MAGNESIUM PO Take by mouth daily.  . Multiple  Vitamins-Minerals (MULTIVITAMIN WOMEN 50+) TABS Take 1 tablet by mouth daily.  . sodium chloride (OCEAN) 0.65 % SOLN nasal spray Place 1 spray into both nostrils as needed for congestion.  . traZODone (DESYREL) 100 MG tablet TAKE 2 TABLETS(200 MG) BY MOUTH AT BEDTIME  . valACYclovir (VALTREX) 1000 MG tablet TAKE 1 TABLET BY MOUTH EVERY DAY (Patient taking differently: as needed.)    PHQ 2/9 Scores 05/22/2020 03/20/2020 11/10/2019 06/07/2019  PHQ - 2 Score 0 0 0 2  PHQ- 9 Score 0 0 0 11    GAD 7 : Generalized Anxiety Score 05/22/2020 03/20/2020 11/10/2019 06/07/2019  Nervous, Anxious, on Edge 0 0 0 0  Control/stop worrying 0 0 0 0  Worry too much - different things 0 0 0 0  Trouble relaxing 0 0 0 2  Restless 0 0 0 1  Easily annoyed or irritable 0 0 0 0  Afraid - awful might happen 0 0 0 0  Total GAD 7 Score 0 0 0 3  Anxiety Difficulty - - Not difficult at all Not difficult at all    BP Readings from Last  3 Encounters:  05/22/20 138/68  03/20/20 100/64  02/18/20 (!) 105/92    Physical Exam Vitals and nursing note reviewed.  Constitutional:      General: She is not in acute distress.    Appearance: She is well-developed.  HENT:     Head: Normocephalic and atraumatic.  Cardiovascular:     Rate and Rhythm: Normal rate and regular rhythm.     Pulses: Normal pulses.     Heart sounds: No murmur heard.   Pulmonary:     Effort: Pulmonary effort is normal. No respiratory distress.     Breath sounds: No wheezing or rhonchi.  Musculoskeletal:     Cervical back: Normal range of motion.     Right lower leg: No edema.     Left lower leg: No edema.  Skin:    General: Skin is warm and dry.     Findings: No rash.  Neurological:     General: No focal deficit present.     Mental Status: She is alert and oriented to person, place, and time.  Psychiatric:        Mood and Affect: Mood normal.        Behavior: Behavior normal.     Wt Readings from Last 3 Encounters:  05/22/20 238 lb (108  kg)  03/20/20 247 lb (112 kg)  11/30/19 248 lb 9.6 oz (112.8 kg)    BP 138/68   Pulse (!) 58   Temp 97.7 F (36.5 C) (Oral)   Ht 5\' 7"  (1.702 m)   Wt 238 lb (108 kg)   SpO2 95%   BMI 37.28 kg/m   Assessment and Plan: 1. Fibromyalgia Lyrica stopped - pt preference once she started PT and had lumbar MRI Continue Cymbalta  2. Aortic atherosclerosis (HCC) Tolerating statin without side effects Will check labs; continue current therapy - Lipid panel - Comprehensive metabolic panel  3. Prediabetes Continue dietary modifications - Hemoglobin A1c   Partially dictated using . Any errors are unintentional.  Animal nutritionist, MD [facility]  05/22/2020

## 2020-05-23 LAB — LIPID PANEL
Chol/HDL Ratio: 2.9 ratio (ref 0.0–4.4)
Cholesterol, Total: 142 mg/dL (ref 100–199)
HDL: 49 mg/dL (ref >39)
LDL Chol Calc (NIH): 71 mg/dL (ref 0–99)
Triglycerides: 122 mg/dL (ref 0–149)
VLDL Cholesterol Cal: 22 mg/dL (ref 5–40)

## 2020-05-23 LAB — COMPREHENSIVE METABOLIC PANEL
ALT: 23 IU/L (ref 0–32)
AST: 23 IU/L (ref 0–40)
Albumin/Globulin Ratio: 1.9 (ref 1.2–2.2)
Albumin: 4.3 g/dL (ref 3.8–4.8)
Alkaline Phosphatase: 96 IU/L (ref 44–121)
BUN/Creatinine Ratio: 11 — ABNORMAL LOW (ref 12–28)
BUN: 11 mg/dL (ref 8–27)
Bilirubin Total: 0.5 mg/dL (ref 0.0–1.2)
CO2: 21 mmol/L (ref 20–29)
Calcium: 9.8 mg/dL (ref 8.7–10.3)
Chloride: 104 mmol/L (ref 96–106)
Creatinine, Ser: 0.96 mg/dL (ref 0.57–1.00)
Globulin, Total: 2.3 g/dL (ref 1.5–4.5)
Glucose: 139 mg/dL — ABNORMAL HIGH (ref 65–99)
Potassium: 4.2 mmol/L (ref 3.5–5.2)
Sodium: 141 mmol/L (ref 134–144)
Total Protein: 6.6 g/dL (ref 6.0–8.5)
eGFR: 64 mL/min/{1.73_m2} (ref >59)

## 2020-05-23 LAB — HEMOGLOBIN A1C
Est. average glucose Bld gHb Est-mCnc: 134 mg/dL
Hgb A1c MFr Bld: 6.3 % — ABNORMAL HIGH (ref 4.8–5.6)

## 2020-07-12 ENCOUNTER — Encounter: Payer: Self-pay | Admitting: Internal Medicine

## 2020-07-12 ENCOUNTER — Telehealth: Payer: Self-pay

## 2020-07-12 ENCOUNTER — Encounter: Payer: Medicare Other | Admitting: Internal Medicine

## 2020-07-12 NOTE — Telephone Encounter (Signed)
Has a appt today 5/25 for "possible covid". Pt needs to do a covid test either an at home test or schedule an appt for a instant test.   [facility] nurse may give results to patient if they return call to the clinic. CRM has also been created for this message for call center purposes  KP

## 2020-07-12 NOTE — Telephone Encounter (Signed)
Called patient to get her ready for her VIDEO visit that was scheduled today at 4 PM.  Patient stated she is on her "11th day of having Covid and feels horrible." Says she still has a tempeture today of 101. Patient does not sound well. Coughing while on the phone and cough sounds raspy and deep. She does not know when she was diagnosed. Everyone in her house had covid too but they are all better. She is still feeling bad.  Spoke with Dr. Judithann Graves who then got on the phone with patient and verbally talked to her for 10 minutes and told patient she is not comfortable with just prescribing abx over the phone. Told patient she needs to be seen at the ER or UC for treatment in person. After Dr.BErglund told the patient this several times she still refuses to go to ER or UC.   Patient is scheduled to come to our office in the morning to be seen in person.

## 2020-07-13 ENCOUNTER — Ambulatory Visit
Admission: RE | Admit: 2020-07-13 | Discharge: 2020-07-13 | Disposition: A | Discharge status: Home / Self Care | Payer: Medicare Other | Attending: Internal Medicine | Admitting: Internal Medicine

## 2020-07-13 ENCOUNTER — Other Ambulatory Visit: Payer: Self-pay

## 2020-07-13 ENCOUNTER — Encounter: Payer: Self-pay | Admitting: Internal Medicine

## 2020-07-13 ENCOUNTER — Ambulatory Visit (INDEPENDENT_AMBULATORY_CARE_PROVIDER_SITE_OTHER): Payer: Medicare Other | Admitting: Internal Medicine

## 2020-07-13 ENCOUNTER — Ambulatory Visit
Admission: RE | Admit: 2020-07-13 | Discharge: 2020-07-13 | Disposition: A | Discharge status: Home / Self Care | Payer: Medicare Other | Source: Ambulatory Visit | Attending: Internal Medicine | Admitting: Internal Medicine

## 2020-07-13 VITALS — BP 112/74 | HR 82 | Temp 97.9°F | Ht 67.0 in | Wt 238.0 lb

## 2020-07-13 DIAGNOSIS — J1282 Pneumonia due to coronavirus disease 2019: Secondary | ICD-10-CM

## 2020-07-13 DIAGNOSIS — J80 Acute respiratory distress syndrome: Secondary | ICD-10-CM | POA: Diagnosis not present

## 2020-07-13 DIAGNOSIS — U071 COVID-19: Secondary | ICD-10-CM

## 2020-07-13 DIAGNOSIS — R509 Fever, unspecified: Secondary | ICD-10-CM | POA: Diagnosis not present

## 2020-07-13 DIAGNOSIS — R0602 Shortness of breath: Secondary | ICD-10-CM | POA: Diagnosis not present

## 2020-07-13 MED ORDER — AZITHROMYCIN 250 MG PO TABS: ORAL_TABLET | ORAL | 0 refills | Status: AC

## 2020-07-13 MED ORDER — PROMETHAZINE-DM 6.25-15 MG/5ML PO SYRP
5.0000 mL | ORAL_SOLUTION | Freq: Four times a day (QID) | ORAL | 0 refills | Status: DC | PRN
Start: 2020-07-13 — End: 2020-09-18

## 2020-07-13 MED ORDER — PREDNISONE 10 MG PO TABS: 10.0000 mg | ORAL_TABLET | ORAL | 0 refills | Status: AC

## 2020-07-13 NOTE — Progress Notes (Signed)
Date:  07/13/2020   Name:  Ann Peters   DOB:  05/28/1950   MRN:  [id number]   Chief Complaint: Covid Positive (Tested positive yesterday at home test, cough, nose stopped up, diarrhea, headache, chills, nausea, lost taste and smell)  Cough This is a new problem. Episode onset: started on 06/28/20. The problem has been gradually worsening. The problem occurs every few minutes. The cough is productive of sputum. Associated symptoms include chest pain, chills, a fever, headaches, postnasal drip and shortness of breath. Pertinent negatives include no ear pain, sore throat, weight loss or wheezing. Treatments tried: mucinex and claritin. The treatment provided no relief.  Husband and daughter in the home also have Covid but are recovering. Pt feels that she is getting worse.    Lab Results  Component Value Date   CREATININE 0.96 05/22/2020   BUN 11 05/22/2020   NA 141 05/22/2020   K 4.2 05/22/2020   CL 104 05/22/2020   CO2 21 05/22/2020   Lab Results  Component Value Date   CHOL 142 05/22/2020   HDL 49 05/22/2020   LDLCALC 71 05/22/2020   TRIG 122 05/22/2020   CHOLHDL 2.9 05/22/2020   Lab Results  Component Value Date   TSH 3.710 11/17/2018   Lab Results  Component Value Date   HGBA1C 6.3 (H) 05/22/2020   Lab Results  Component Value Date   WBC 5.2 06/07/2019   HGB 12.8 06/07/2019   HCT 37.6 06/07/2019   MCV 91 06/07/2019   PLT 313 06/07/2019   Lab Results  Component Value Date   ALT 23 05/22/2020   AST 23 05/22/2020   ALKPHOS 96 05/22/2020   BILITOT 0.5 05/22/2020     Review of Systems  Constitutional: Positive for chills and fever. Negative for weight loss.  HENT: Positive for postnasal drip and voice change. Negative for congestion, ear pain, sore throat and trouble swallowing.   Eyes: Negative for visual disturbance.  Respiratory: Positive for cough and shortness of breath. Negative for wheezing.   Cardiovascular: Positive for chest pain. Negative for  palpitations and leg swelling.  Gastrointestinal: Positive for diarrhea. Negative for abdominal pain, nausea and vomiting.  Genitourinary: Negative for dysuria.  Neurological: Positive for headaches. Negative for dizziness and light-headedness.    Patient Active Problem List   Diagnosis Date Noted  . Carpal tunnel syndrome on right 03/20/2020  . Cerebral microvascular disease 11/10/2019  . Pleural effusion, left 10/19/2019  . Aortic atherosclerosis (HCC) 10/08/2019  . Personal history of colonic polyps   . Insomnia 11/05/2016  . Osteoarthritis of left hip 10/10/2015  . Status post left hip replacement 10/10/2015  . Prediabetes 01/03/2015  . Anxiety 09/01/2014  . Vitamin D deficiency 09/01/2014  . Herpes zoster 09/01/2014  . Fibromyalgia 11/15/2013  . Recurrent major depressive disorder, in partial remission (HCC) 11/15/2013  . Vitamin B12 deficiency 12/23/2011  . Allergic rhinitis 04/18/2011    Allergies  Allergen Reactions  . Amoxicillin-Pot Clavulanate Nausea And Vomiting and Other (See Comments)    Other reaction(s): Vomiting  . Beef-Derived Products Nausea And Vomiting  . Pegademase Bovine Nausea And Vomiting  . Penicillins Nausea And Vomiting and Other (See Comments)    Has patient had a PCN reaction causing immediate rash, facial/tongue/throat swelling, SOB or lightheadedness with hypotension: no Has patient had a PCN reaction causing severe rash involving mucus membranes or skin necrosis: No Has patient had a PCN reaction that required hospitalization No Has patient had a PCN reaction  occurring within the last 10 years: Yes If all of the above answers are "NO", then may proceed with Cephalosporin use.         Past Surgical History:  Procedure Laterality Date  . BLADDER REPAIR    . COLON SURGERY  2011   10 inches removed for benign mass, does not absorb nutrients as well  . COLONOSCOPY  2011   polyp removed- Dr Marva Panda  . COLONOSCOPY WITH PROPOFOL N/A  07/04/2017   Procedure: COLONOSCOPY WITH PROPOFOL;  Surgeon: Midge Minium, MD;  Location: [facility];  Service: Endoscopy;  Laterality: N/A;  recurrent shingles  . NASAL SINUS SURGERY    . TONSILLECTOMY    . TOTAL HIP ARTHROPLASTY Left 10/10/2015   Procedure: LEFT TOTAL HIP ARTHROPLASTY ANTERIOR APPROACH;  Surgeon: Kathryne Hitch, MD;  Location: [facility];  Service: Orthopedics;  Laterality: Left;  . TRIGGER FINGER RELEASE Left 03/28/2017   Procedure: MIDDLE FINGER DUPREYTRENS RELEASE;  Surgeon: Erin Sons, MD;  Location: [facility];  Service: Orthopedics;  Laterality: Left;  . TUBAL LIGATION    . TURBINATE REDUCTION Bilateral 04/29/2019   Procedure: TURBINATE REDUCTION WITH VIVEAR;  Surgeon: Vernie Murders, MD;  Location: [facility];  Service: ENT;  Laterality: Bilateral;  vivear  . VAGINAL HYSTERECTOMY     partial    Social History   Tobacco Use  . Smoking status: Never Smoker  . Smokeless tobacco: Never Used  Vaping Use  . Vaping Use: Never used  Substance Use Topics  . Alcohol use: No    Alcohol/week: 0.0 standard drinks  . Drug use: No     Medication list has been reviewed and updated.  Current Meds  Medication Sig  . atorvastatin (LIPITOR) 10 MG tablet Take 1 tablet (10 mg total) by mouth daily.  Marland Kitchen azelastine (OPTIVAR) 0.05 % ophthalmic solution Place 1 drop into both eyes 2 (two) times daily.  Marland Kitchen azithromycin (ZITHROMAX Z-PAK) 250 MG tablet UAD  . Cholecalciferol (VITAMIN D3) 5000 UNITS CAPS Take 1 capsule (5,000 Units total) by mouth daily.  . Cyanocobalamin (VITAMIN B-12) 5000 MCG SUBL Place 15,000 mcg under the tongue daily.  . DULoxetine (CYMBALTA) 60 MG capsule TAKE 2 CAPSULES BY MOUTH DAILY  . fluticasone (FLONASE) 50 MCG/ACT nasal spray Place 2 sprays into both nostrils daily.  Marland Kitchen guaiFENesin (MUCINEX PO) Take by mouth.  Marland Kitchen MAGNESIUM PO Take by mouth daily.  . Multiple Vitamins-Minerals (MULTIVITAMIN WOMEN 50+) TABS Take 1 tablet by  mouth daily.  . predniSONE (DELTASONE) 10 MG tablet Take 1 tablet (10 mg total) by mouth as directed for 6 days. Take 6,5,4,3,2,1 then stop  . promethazine-dextromethorphan (PROMETHAZINE-DM) 6.25-15 MG/5ML syrup Take 5 mLs by mouth 4 (four) times daily as needed for cough.  . Pseudoephedrine HCl (SUDAFED PO) Take by mouth.  . sodium chloride (OCEAN) 0.65 % SOLN nasal spray Place 1 spray into both nostrils as needed for congestion.  . traZODone (DESYREL) 100 MG tablet TAKE 2 TABLETS(200 MG) BY MOUTH AT BEDTIME  . valACYclovir (VALTREX) 1000 MG tablet TAKE 1 TABLET BY MOUTH EVERY DAY (Patient taking differently: as needed.)    PHQ 2/9 Scores 07/13/2020 07/12/2020 05/22/2020 03/20/2020  PHQ - 2 Score 0 0 0 0  PHQ- 9 Score 2 0 0 0    GAD 7 : Generalized Anxiety Score 07/13/2020 07/12/2020 05/22/2020 03/20/2020  Nervous, Anxious, on Edge 0 0 0 0  Control/stop worrying 0 0 0 0  Worry too much - different things 0 0  0 0  Trouble relaxing 0 0 0 0  Restless 0 0 0 0  Easily annoyed or irritable 0 0 0 0  Afraid - awful might happen 0 0 0 0  Total GAD 7 Score 0 0 0 0  Anxiety Difficulty - Not difficult at all - -    BP Readings from Last 3 Encounters:  07/13/20 112/74  05/22/20 138/68  03/20/20 100/64    Physical Exam Constitutional:      Appearance: She is ill-appearing.  HENT:     Right Ear: Tympanic membrane normal.     Left Ear: Tympanic membrane normal.     Nose:     Right Sinus: No maxillary sinus tenderness or frontal sinus tenderness.     Left Sinus: No maxillary sinus tenderness or frontal sinus tenderness.  Cardiovascular:     Rate and Rhythm: Normal rate and regular rhythm.     Pulses: Normal pulses.     Heart sounds: No murmur heard.   Pulmonary:     Effort: Pulmonary effort is normal.     Breath sounds: Rhonchi (both lower lobes) present. No wheezing.     Comments: Frequent loose cough  Musculoskeletal:     Cervical back: Normal range of motion.     Right lower leg: No  edema.     Left lower leg: No edema.  Lymphadenopathy:     Cervical: No cervical adenopathy.  Neurological:     Mental Status: She is alert.  Psychiatric:        Attention and Perception: Attention normal.     Wt Readings from Last 3 Encounters:  07/13/20 238 lb (108 kg)  07/12/20 238 lb (108 kg)  05/22/20 238 lb (108 kg)    BP 112/74   Pulse 82   Temp 97.9 F (36.6 C) (Oral)   Ht 5\' 7"  (1.702 m)   Wt 238 lb (108 kg)   SpO2 94%   BMI 37.28 kg/m   Assessment and Plan: 1. COVID-19 virus infection She is well past the window of treatment with anti-virals Will treat cough symptomatically.  Push fluids, rest - promethazine-dextromethorphan (PROMETHAZINE-DM) 6.25-15 MG/5ML syrup; Take 5 mLs by mouth 4 (four) times daily as needed for cough.  Dispense: 118 mL; Refill: 0  2. Pneumonia due to COVID-19 virus Suspect bacterial infection - sats are acceptable; no fever currently Precautions given regarding need to present to ED - DG Chest 2 View; Future - azithromycin (ZITHROMAX Z-PAK) 250 MG tablet; UAD  Dispense: 6 each; Refill: 0 - predniSONE (DELTASONE) 10 MG tablet; Take 1 tablet (10 mg total) by mouth as directed for 6 days. Take 6,5,4,3,2,1 then stop  Dispense: 21 tablet; Refill: 0   Partially dictated using 04-22-1990. Any errors are unintentional.  Animal nutritionist, MD [facility]  07/13/2020

## 2020-07-13 NOTE — Progress Notes (Signed)
Error

## 2020-07-25 ENCOUNTER — Other Ambulatory Visit: Payer: Self-pay | Admitting: Internal Medicine

## 2020-07-25 DIAGNOSIS — G4700 Insomnia, unspecified: Secondary | ICD-10-CM

## 2020-07-25 NOTE — Telephone Encounter (Signed)
Requested Prescriptions  Pending Prescriptions Disp Refills  . traZODone (DESYREL) 100 MG tablet [Pharmacy Med Name: TRAZODONE 100MG  TABLETS] 180 tablet 0    Sig: TAKE 2 TABLETS(200 MG) BY MOUTH AT BEDTIME     Psychiatry: Antidepressants - Serotonin Modulator Passed - 07/25/2020 10:32 PM      Passed - Completed PHQ-2 or PHQ-9 in the last 360 days      Passed - Valid encounter within last 6 months    Recent Outpatient Visits          1 week ago COVID-19 virus infection   [facility], MD   2 months ago Fibromyalgia   [facility], MD   4 months ago Fibromyalgia   [facility], MD   8 months ago Pleural effusion, left   [facility], MD   1 year ago Recurrent major depressive disorder, in partial remission [facility])   [facility], MD      Future Appointments            In 3 months Reubin Milan Judithann Graves, MD [facility]

## 2020-08-03 ENCOUNTER — Telehealth: Payer: Self-pay | Admitting: Internal Medicine

## 2020-08-03 NOTE — Telephone Encounter (Signed)
Copied from CRM [id number]. Topic: Medicare AWV >> Aug 03, 2020 11:20 AM Claudette Laws R wrote: Reason for CRM:  Left message for patient to call back and schedule Medicare Annual Wellness Visit (AWV) in office.   If unable to come into the office for AWV,  please offer to do virtually or by telephone.  Last AWV:  03/08/2019  Please schedule at anytime with [facility] Health Advisor.  40 minute appointment  Any questions, please contact me at [phone]

## 2020-08-15 ENCOUNTER — Other Ambulatory Visit: Payer: Self-pay | Admitting: Internal Medicine

## 2020-08-15 DIAGNOSIS — M797 Fibromyalgia: Secondary | ICD-10-CM

## 2020-08-31 ENCOUNTER — Telehealth: Payer: Self-pay | Admitting: Internal Medicine

## 2020-08-31 NOTE — Telephone Encounter (Signed)
Left voice mail to set up appointment for f/u for review from May with Covid

## 2020-08-31 NOTE — Telephone Encounter (Signed)
Please call pt to come in for a f/u from Covid in May and for medication review.  KP

## 2020-08-31 NOTE — Telephone Encounter (Signed)
Please call and ask her to come in for a follow up from her Covid infection in May and for general medication review.

## 2020-09-12 ENCOUNTER — Telehealth: Payer: Self-pay

## 2020-09-12 NOTE — Telephone Encounter (Signed)
Patient moved to next week for her breast skin concerns.

## 2020-09-12 NOTE — Telephone Encounter (Unsigned)
Copied from CRM [id number]. Topic: General - Other >> Sep 12, 2020  2:02 PM Marylen Ponto wrote: Reason for CRM: Pt stated she would like Dr. Judithann Graves to be aware that she has a history of cancer in her family so if there is any way she can be seen prior to appt scheduled for 10/11/20 to please give her a call.

## 2020-09-18 ENCOUNTER — Ambulatory Visit (INDEPENDENT_AMBULATORY_CARE_PROVIDER_SITE_OTHER): Payer: Medicare Other | Admitting: Internal Medicine

## 2020-09-18 ENCOUNTER — Encounter: Payer: Self-pay | Admitting: Internal Medicine

## 2020-09-18 ENCOUNTER — Other Ambulatory Visit: Payer: Self-pay

## 2020-09-18 VITALS — BP 112/74 | HR 79 | Temp 97.7°F | Ht 67.0 in | Wt 235.0 lb

## 2020-09-18 DIAGNOSIS — L570 Actinic keratosis: Secondary | ICD-10-CM

## 2020-09-18 NOTE — Patient Instructions (Signed)
[facility] in [place].  [phone]  [facility] in [place]

## 2020-09-18 NOTE — Progress Notes (Signed)
Date:  09/18/2020   Name:  Ann Peters   DOB:  [birth year]   MRN:  [id number]   Chief Complaint: Breast Problem (X1 month, Raised brown spots under left breast, 3 spots, not painful )  HPI  Lab Results  Component Value Date   CREATININE 0.96 05/22/2020   BUN 11 05/22/2020   NA 141 05/22/2020   K 4.2 05/22/2020   CL 104 05/22/2020   CO2 21 05/22/2020   Lab Results  Component Value Date   CHOL 142 05/22/2020   HDL 49 05/22/2020   LDLCALC 71 05/22/2020   TRIG 122 05/22/2020   CHOLHDL 2.9 05/22/2020   Lab Results  Component Value Date   TSH 3.710 11/17/2018   Lab Results  Component Value Date   HGBA1C 6.3 (H) 05/22/2020   Lab Results  Component Value Date   WBC 5.2 06/07/2019   HGB 12.8 06/07/2019   HCT 37.6 06/07/2019   MCV 91 06/07/2019   PLT 313 06/07/2019   Lab Results  Component Value Date   ALT 23 05/22/2020   AST 23 05/22/2020   ALKPHOS 96 05/22/2020   BILITOT 0.5 05/22/2020     Review of Systems  Skin:  Positive for color change. Negative for rash and wound.   Patient Active Problem List   Diagnosis Date Noted   Carpal tunnel syndrome on right 03/20/2020   Cerebral microvascular disease 11/10/2019   Pleural effusion, left 10/19/2019   Aortic atherosclerosis (HCC) 10/08/2019   Personal history of colonic polyps    Insomnia 11/05/2016   Osteoarthritis of left hip 10/10/2015   Status post left hip replacement 10/10/2015   Prediabetes 01/03/2015   Anxiety 09/01/2014   Vitamin D deficiency 09/01/2014   Herpes zoster 09/01/2014   Fibromyalgia 11/15/2013   Recurrent major depressive disorder, in partial remission (HCC) 11/15/2013   Vitamin B12 deficiency 12/23/2011   Allergic rhinitis 04/18/2011    Allergies  Allergen Reactions   Amoxicillin-Pot Clavulanate Nausea And Vomiting and Other (See Comments)    Other reaction(s): Vomiting   Beef-Derived Products Nausea And Vomiting   Pegademase Bovine Nausea And Vomiting   Penicillins Nausea And  Vomiting and Other (See Comments)    Has patient had a PCN reaction causing immediate rash, facial/tongue/throat swelling, SOB or lightheadedness with hypotension: no Has patient had a PCN reaction causing severe rash involving mucus membranes or skin necrosis: No Has patient had a PCN reaction that required hospitalization No Has patient had a PCN reaction occurring within the last 10 years: Yes If all of the above answers are "NO", then may proceed with Cephalosporin use.         Past Surgical History:  Procedure Laterality Date   BLADDER REPAIR     COLON SURGERY  2011   10 inches removed for benign mass, does not absorb nutrients as well   COLONOSCOPY  2011   polyp removed- Dr Marva Panda   COLONOSCOPY WITH PROPOFOL N/A 07/04/2017   Procedure: COLONOSCOPY WITH PROPOFOL;  Surgeon: Midge Minium, MD;  Location: [facility];  Service: Endoscopy;  Laterality: N/A;  recurrent shingles   NASAL SINUS SURGERY     TONSILLECTOMY     TOTAL HIP ARTHROPLASTY Left 10/10/2015   Procedure: LEFT TOTAL HIP ARTHROPLASTY ANTERIOR APPROACH;  Surgeon: Kathryne Hitch, MD;  Location: [facility] OR;  Service: Orthopedics;  Laterality: Left;   TRIGGER FINGER RELEASE Left 03/28/2017   Procedure: MIDDLE FINGER DUPREYTRENS RELEASE;  Surgeon: Erin Sons, MD;  Location:  [facility];  Service: Orthopedics;  Laterality: Left;   TUBAL LIGATION     TURBINATE REDUCTION Bilateral 04/29/2019   Procedure: TURBINATE REDUCTION WITH VIVEAR;  Surgeon: Vernie Murders, MD;  Location: [facility];  Service: ENT;  Laterality: Bilateral;  vivear   VAGINAL HYSTERECTOMY     partial    Social History   Tobacco Use   Smoking status: Never   Smokeless tobacco: Never  Vaping Use   Vaping Use: Never used  Substance Use Topics   Alcohol use: No    Alcohol/week: 0.0 standard drinks   Drug use: No     Medication list has been reviewed and updated.  Current Meds  Medication Sig   atorvastatin  (LIPITOR) 10 MG tablet Take 1 tablet (10 mg total) by mouth daily.   azelastine (OPTIVAR) 0.05 % ophthalmic solution Place 1 drop into both eyes 2 (two) times daily. (Patient taking differently: Place 1 drop into both eyes as needed.)   Cholecalciferol (VITAMIN D3) 5000 UNITS CAPS Take 1 capsule (5,000 Units total) by mouth daily.   Cyanocobalamin (VITAMIN B-12) 5000 MCG SUBL Place 15,000 mcg under the tongue daily.   DULoxetine (CYMBALTA) 60 MG capsule TAKE 2 CAPSULES BY MOUTH DAILY   fluticasone (FLONASE) 50 MCG/ACT nasal spray Place 2 sprays into both nostrils daily.   guaiFENesin (MUCINEX PO) Take by mouth.   MAGNESIUM PO Take by mouth daily.   Multiple Vitamins-Minerals (MULTIVITAMIN WOMEN 50+) TABS Take 1 tablet by mouth daily.   sodium chloride (OCEAN) 0.65 % SOLN nasal spray Place 1 spray into both nostrils as needed for congestion.   traZODone (DESYREL) 100 MG tablet TAKE 2 TABLETS(200 MG) BY MOUTH AT BEDTIME   valACYclovir (VALTREX) 1000 MG tablet TAKE 1 TABLET BY MOUTH EVERY DAY (Patient taking differently: as needed.)    PHQ 2/9 Scores 07/13/2020 07/12/2020 05/22/2020 03/20/2020  PHQ - 2 Score 0 0 0 0  PHQ- 9 Score 2 0 0 0    GAD 7 : Generalized Anxiety Score 07/13/2020 07/12/2020 05/22/2020 03/20/2020  Nervous, Anxious, on Edge 0 0 0 0  Control/stop worrying 0 0 0 0  Worry too much - different things 0 0 0 0  Trouble relaxing 0 0 0 0  Restless 0 0 0 0  Easily annoyed or irritable 0 0 0 0  Afraid - awful might happen 0 0 0 0  Total GAD 7 Score 0 0 0 0  Anxiety Difficulty - Not difficult at all - -    BP Readings from Last 3 Encounters:  09/18/20 112/74  07/13/20 112/74  05/22/20 138/68    Physical Exam Vitals and nursing note reviewed.  Constitutional:      General: She is not in acute distress.    Appearance: She is well-developed.  HENT:     Head: Normocephalic and atraumatic.  Pulmonary:     Effort: Pulmonary effort is normal. No respiratory distress.  Skin:     General: Skin is warm and dry.     Findings: Lesion present. No rash.     Comments: Two irregular slightly raised keratoses under left breast.  Neurological:     Mental Status: She is alert and oriented to person, place, and time.  Psychiatric:        Mood and Affect: Mood normal.        Behavior: Behavior normal.     Wt Readings from Last 3 Encounters:  09/18/20 235 lb (106.6 kg)  07/13/20 238 lb (108 kg)  07/12/20 238 lb (108 kg)    BP 112/74   Pulse 79   Temp 97.7 F (36.5 C) (Oral)   Ht 5\' 7"  (1.702 m)   Wt 235 lb (106.6 kg)   SpO2 95%   BMI 36.81 kg/m   Assessment and Plan: 1. Keratosis Benign appearing lesions. Patient is reassured.  She is instructed not to self treat the lesion but to see Dermatology if she chooses to address them.   Partially dictated using . Any errors are unintentional.  Animal nutritionist, MD [facility]  09/18/2020

## 2020-09-19 ENCOUNTER — Other Ambulatory Visit: Payer: Self-pay | Admitting: Internal Medicine

## 2020-09-19 DIAGNOSIS — I6789 Other cerebrovascular disease: Secondary | ICD-10-CM

## 2020-09-19 DIAGNOSIS — I700 Atherosclerosis of aorta: Secondary | ICD-10-CM

## 2020-10-10 DIAGNOSIS — I700 Atherosclerosis of aorta: Secondary | ICD-10-CM | POA: Diagnosis not present

## 2020-10-10 DIAGNOSIS — M67441 Ganglion, right hand: Secondary | ICD-10-CM | POA: Diagnosis not present

## 2020-10-10 DIAGNOSIS — M720 Palmar fascial fibromatosis [Dupuytren]: Secondary | ICD-10-CM | POA: Diagnosis not present

## 2020-10-11 ENCOUNTER — Ambulatory Visit: Payer: Medicare Other | Admitting: Internal Medicine

## 2020-10-17 ENCOUNTER — Other Ambulatory Visit: Payer: Self-pay | Admitting: Internal Medicine

## 2020-10-17 DIAGNOSIS — M797 Fibromyalgia: Secondary | ICD-10-CM

## 2020-10-18 NOTE — Telephone Encounter (Signed)
Requested medications are due for refill today.  unknown  Requested medications are on the active medications list.  no  Last refill. 03/20/2020  Future visit scheduled.   yes  Notes to clinic.  Medication not delegated. Medication was discontinued 05/22/2020.

## 2020-10-19 ENCOUNTER — Ambulatory Visit: Payer: Self-pay

## 2020-10-19 NOTE — Telephone Encounter (Signed)
Pt. Started having a cough last week with white mucus. Has shortness of breath as well. Sleeps well, but propped up on a wedge. No fever. Concerned she may have pneumonia. Marchelle Folks in the practice recommends pt. Have COVID 19 test and call back for disposition depending on her results. Instructed pt. To go to ED for worsening of symptoms. Verbalizes understanding.

## 2020-10-19 NOTE — Telephone Encounter (Signed)
Reason for Disposition  [1] MODERATE longstanding difficulty breathing (e.g., speaks in phrases, SOB even at rest, pulse 100-120) AND [2] SAME as normal  Answer Assessment - Initial Assessment Questions 1. RESPIRATORY STATUS: "Describe your breathing?" (e.g., wheezing, shortness of breath, unable to speak, severe coughing)     Shortness of breath 2. ONSET: "When did this breathing problem begin?"      1 week ago 3. PATTERN "Does the difficult breathing come and go, or has it been constant since it started?"      Constant 4. SEVERITY: "How bad is your breathing?" (e.g., mild, moderate, severe)    - MILD: No SOB at rest, mild SOB with walking, speaks normally in sentences, can lie down, no retractions, pulse < 100.    - MODERATE: SOB at rest, SOB with minimal exertion and prefers to sit, cannot lie down flat, speaks in phrases, mild retractions, audible wheezing, pulse 100-120.    - SEVERE: Very SOB at rest, speaks in single words, struggling to breathe, sitting hunched forward, retractions, pulse > 120      Moderate 5. RECURRENT SYMPTOM: "Have you had difficulty breathing before?" If Yes, ask: "When was the last time?" and "What happened that time?"      Yes 6. CARDIAC HISTORY: "Do you have any history of heart disease?" (e.g., heart attack, angina, bypass surgery, angioplasty)      No 7. LUNG HISTORY: "Do you have any history of lung disease?"  (e.g., pulmonary embolus, asthma, emphysema)     Left lung problems 8. CAUSE: "What do you think is causing the breathing problem?"      Unsure 9. OTHER SYMPTOMS: "Do you have any other symptoms? (e.g., dizziness, runny nose, cough, chest pain, fever)     Cough 10. O2 SATURATION MONITOR:  "Do you use an oxygen saturation monitor (pulse oximeter) at home?" If Yes, "What is your reading (oxygen level) today?" "What is your usual oxygen saturation reading?" (e.g., 95%)       No 11. PREGNANCY: "Is there any chance you are pregnant?" "When was your last  menstrual period?"       No 12. TRAVEL: "Have you traveled out of the country in the last month?" (e.g., travel history, exposures)       No  Protocols used: Breathing Difficulty-A-AH

## 2020-10-20 NOTE — Telephone Encounter (Signed)
Noted  Pt will call back with covid results.  KP

## 2020-10-23 ENCOUNTER — Telehealth: Payer: Self-pay | Admitting: Internal Medicine

## 2020-10-23 NOTE — Telephone Encounter (Signed)
Copied from CRM [id number]. Topic: Medicare AWV >> Oct 23, 2020 10:55 AM Claudette Laws R wrote: Reason for CRM:  Left message for patient to call back and schedule Medicare Annual Wellness Visit (AWV) in office.   If unable to come into the office for AWV,  please offer to do virtually or by telephone.  Last AWV: 03/08/2019  Please schedule at anytime with [facility] Health Advisor.  40 minute appointment  Any questions, please contact me at [phone]

## 2020-10-25 ENCOUNTER — Other Ambulatory Visit: Payer: Self-pay | Admitting: Internal Medicine

## 2020-10-25 DIAGNOSIS — G4700 Insomnia, unspecified: Secondary | ICD-10-CM

## 2020-10-26 ENCOUNTER — Encounter: Payer: Self-pay | Admitting: Pulmonary Disease

## 2020-10-26 ENCOUNTER — Ambulatory Visit (INDEPENDENT_AMBULATORY_CARE_PROVIDER_SITE_OTHER): Payer: Medicare Other | Admitting: Pulmonary Disease

## 2020-10-26 ENCOUNTER — Telehealth: Payer: Self-pay | Admitting: Pulmonary Disease

## 2020-10-26 ENCOUNTER — Ambulatory Visit
Admission: RE | Admit: 2020-10-26 | Discharge: 2020-10-26 | Disposition: A | Discharge status: Home / Self Care | Payer: Medicare Other | Source: Ambulatory Visit | Attending: Pulmonary Disease | Admitting: Pulmonary Disease

## 2020-10-26 ENCOUNTER — Other Ambulatory Visit: Payer: Self-pay

## 2020-10-26 ENCOUNTER — Ambulatory Visit
Admission: RE | Admit: 2020-10-26 | Discharge: 2020-10-26 | Disposition: A | Discharge status: Home / Self Care | Payer: Medicare Other | Attending: Pulmonary Disease | Admitting: Pulmonary Disease

## 2020-10-26 VITALS — BP 118/76 | HR 89 | Temp 97.5°F | Ht 67.0 in | Wt 237.0 lb

## 2020-10-26 DIAGNOSIS — J90 Pleural effusion, not elsewhere classified: Secondary | ICD-10-CM

## 2020-10-26 DIAGNOSIS — J209 Acute bronchitis, unspecified: Secondary | ICD-10-CM

## 2020-10-26 DIAGNOSIS — M419 Scoliosis, unspecified: Secondary | ICD-10-CM | POA: Diagnosis not present

## 2020-10-26 DIAGNOSIS — J9801 Acute bronchospasm: Secondary | ICD-10-CM | POA: Diagnosis not present

## 2020-10-26 MED ORDER — PREDNISONE 10 MG (21) PO TBPK: ORAL_TABLET | ORAL | 0 refills | Status: DC

## 2020-10-26 MED ORDER — AIRDUO DIGIHALER 113-14 MCG/ACT IN AEPB: 1.0000 | INHALATION_SPRAY | Freq: Two times a day (BID) | RESPIRATORY_TRACT | 0 refills | Status: DC

## 2020-10-26 MED ORDER — AZITHROMYCIN 250 MG PO TABS: ORAL_TABLET | ORAL | 0 refills | Status: AC

## 2020-10-26 NOTE — Progress Notes (Signed)
Subjective:    Patient ID: Ann Peters, female    DOB: 1950/11/29, 70 y.o.   MRN: [id number] Chief Complaint  Patient presents with   Follow-up    CXR--flu like sx 2 weeks ago- neg covid test. C/o left side discomfort with cough, sob with exertion and wheezing.     HPI Patient is a 70 year old lifelong never smoker whom we last saw here on 30 November 2019 after a bout with pleuropericarditis of uncertain etiology, likely viral.  Issue resolved with antibiotics, colchicine and prednisone.  Her left pleural effusion resolved completely.  Pericardial effusion also clinically improved.  She had been doing well until approximately 2 weeks ago when she developed some issues with flulike symptoms, cough which has been nonproductive and dyspnea on exertion.  She has also noted some wheezing which is new to her.  In addition she notes that she had a negative COVID test just a few days ago as she tested herself to ensure it was not COVID.  She has not had any documented fevers but has felt to have generalized malaise.  No joint pain.  She feels like her left side is "full" again.  She has not had any orthopnea or paroxysmal nocturnal dyspnea.  No lower extremity edema.  No chest pain.   Review of Systems A 10 point review of systems was performed and it is as noted above otherwise negative.    Objective:   Physical Exam BP 118/76 (BP Location: Left Arm, Cuff Size: Normal)   Pulse 89   Temp (!) 97.5 F (36.4 C) (Temporal)   Ht 5\' 7"  (1.702 m)   Wt 237 lb (107.5 kg)   SpO2 96%   BMI 37.12 kg/m   GENERAL: Obese woman, alert, no acute distress.  No tachypnea noted. HEAD: Normocephalic, atraumatic.  EYES: Pupils equal, round, reactive to light.  No scleral icterus.  MOUTH: Nose/mouth/throat not examined due to masking requirements for COVID 19. NECK: Supple. No thyromegaly. Trachea midline. No JVD.  No adenopathy. PULMONARY: Good air entry bilaterally, symmetrical.  Scattered rhonchi and  wheezes. CARDIOVASCULAR: S1 and S2. Regular rate and rhythm.  No rubs, murmurs or gallops heard. GASTROINTESTINAL: Abdomen is obese otherwise unremarkable. MUSCULOSKELETAL: No joint deformity, no clubbing, no edema.   NEUROLOGIC: No focal deficit noted.  Fluent speech.  No gait disturbance noted. SKIN: Intact,warm,dry.  Limited exam no rashes. PSYCH: Mood and behavior are normal.  PA and lateral chest performed today:    No evidence of pleural effusion.  Assessment & Plan:     ICD-10-CM   1. Acute bronchitis due to infection  J20.9    We will treat with azithromycin Prednisone taper pack    2. Bronchospasm  J98.01    Air Duo 113/14, 1 inhalation twice a day Sample provided for the patient    3. Pleural effusion, left - NO RECURRENCE  J90    Chest x-ray shows no recurrence Patient was reassured     Meds ordered this encounter  Medications   azithromycin (ZITHROMAX) 250 MG tablet    Sig: Take 2 tablets (500 mg) on  Day 1,  followed by 1 tablet (250 mg) once daily on Days 2 through 5.    Dispense:  6 each    Refill:  0   PredniSONE (STERAPRED UNI-PAK 21 TAB) 10 MG (21) TBPK tablet    Sig: Taper pack, use as directed in the package.    Dispense:  21 tablet    Refill:  0    Fluticasone-Salmeterol,sensor, (AIRDUO DIGIHALER) 113-14 MCG/ACT AEPB    Sig: Inhale 1 puff into the lungs 2 (two) times daily.    Dispense:  1 each    Refill:  0    Order Specific Question:   Lot Number?    Answer:   [id number]    Order Specific Question:   Expiration Date?    Answer:   02/18/2021    Order Specific Question:   Quantity    Answer:   1   Patient appears to have an acute bronchitis with associated bronchospasm therefore bout of asthmatic bronchitis.  She will be treated with prednisone taper and azithromycin.  We have provided her with an inhaler sample to see if this helps her bronchospasm issues.  We will see her in follow-up in 3 to 4 weeks time she is to contact us prior to that time  should any new difficulties arise.  Gailen Shelter, MD Advanced Bronchoscopy PCCM [facility]    *This note was dictated using voice recognition software/Dragon.  Despite best efforts to proofread, errors can occur which can change the meaning.  Any change was purely unintentional.

## 2020-10-26 NOTE — Telephone Encounter (Signed)
Per Dr. Jayme Cloud, can schedule today at 1:30 with CXR prior   Negative home covid test on Monday. Appt scheduled. CXR ordered.  Patient is aware and voiced her understanding. Nothing further needed.

## 2020-10-26 NOTE — Telephone Encounter (Signed)
May do an APP slot sooner if available.  Has she called her primary care for initial evaluation?  Has she had COVID testing?

## 2020-10-26 NOTE — Patient Instructions (Signed)
We are giving you an antibiotic and a prednisone taper these have been sent to your pharmacy.  We are giving you an inhaler that will be 1 puff twice a day make sure you rinse your mouth well after you use it.  We will see her in follow-up in 3 to 4 weeks time with either me or the nurse practitioner.  If you are doing better at that time we will going ahead and order breathing tests.

## 2020-10-26 NOTE — Telephone Encounter (Signed)
Spoke to patient, who is is requesting a sooner appt then 11/23/2020. Hx of pleural effusion. She stated that she had a cold 1 week ago and her left lung is not clearing.  C/o wheezing, dry cough, mild increased sob with exertion, and occ left side discomfort.  Denies f/c/s or additional sx.  She is concerned about PNA as she has a history.  She is taking a mucus relief PE with some relief.   Dr. Jayme Cloud, please advise. Thanks

## 2020-11-13 ENCOUNTER — Other Ambulatory Visit: Payer: Self-pay | Admitting: Internal Medicine

## 2020-11-13 DIAGNOSIS — M797 Fibromyalgia: Secondary | ICD-10-CM

## 2020-11-21 ENCOUNTER — Other Ambulatory Visit: Payer: Self-pay

## 2020-11-21 ENCOUNTER — Ambulatory Visit (INDEPENDENT_AMBULATORY_CARE_PROVIDER_SITE_OTHER): Payer: Medicare Other | Admitting: Internal Medicine

## 2020-11-21 ENCOUNTER — Encounter: Payer: Self-pay | Admitting: Internal Medicine

## 2020-11-21 VITALS — BP 128/82 | HR 82 | Temp 97.7°F | Ht 67.0 in | Wt 237.0 lb

## 2020-11-21 DIAGNOSIS — I700 Atherosclerosis of aorta: Secondary | ICD-10-CM

## 2020-11-21 DIAGNOSIS — G2581 Restless legs syndrome: Secondary | ICD-10-CM

## 2020-11-21 DIAGNOSIS — F3341 Major depressive disorder, recurrent, in partial remission: Secondary | ICD-10-CM | POA: Diagnosis not present

## 2020-11-21 DIAGNOSIS — I6789 Other cerebrovascular disease: Secondary | ICD-10-CM

## 2020-11-21 DIAGNOSIS — Z1231 Encounter for screening mammogram for malignant neoplasm of breast: Secondary | ICD-10-CM

## 2020-11-21 DIAGNOSIS — R7303 Prediabetes: Secondary | ICD-10-CM

## 2020-11-21 DIAGNOSIS — Z23 Encounter for immunization: Secondary | ICD-10-CM

## 2020-11-21 DIAGNOSIS — Z0000 Encounter for general adult medical examination without abnormal findings: Secondary | ICD-10-CM

## 2020-11-21 MED ORDER — VALACYCLOVIR HCL 1 G PO TABS: 1000.0000 mg | ORAL_TABLET | Freq: Every day | ORAL | 1 refills | Status: AC

## 2020-11-21 MED ORDER — SHINGRIX 50 MCG/0.5ML IM SUSR: 0.5000 mL | Freq: Once | INTRAMUSCULAR | 1 refills | Status: AC

## 2020-11-21 MED ORDER — ROPINIROLE HCL 0.25 MG PO TABS: 0.2500 mg | ORAL_TABLET | Freq: Every day | ORAL | 1 refills | Status: DC

## 2020-11-21 NOTE — Progress Notes (Signed)
Date:  11/21/2020   Name:  Ann Peters   DOB:  Dec 12, 1950   MRN:  [id number]   Chief Complaint: Annual Exam (Breast exam no pap, shingrix ), Flu Vaccine, and shingles vaccine Ann Peters is a 70 y.o. female who presents today for her Complete Annual Exam. She feels well. She reports exercising none. She reports she is sleeping well. Breast complaints pain in left breast X2 months.  Mammogram: 03/2019 DEXA: 03/2019 Normal Pap smear: discontinued Colonoscopy: 06/2017 repeat 2024  Immunization History  Administered Date(s) Administered   Fluad Quad(high Dose 65+) 11/17/2018, 11/10/2019   Influenza, High Dose Seasonal PF 11/05/2016, 02/04/2018   Influenza,inj,Quad PF,6+ Mos 01/03/2015, 01/22/2016   PFIZER(Purple Top)SARS-COV-2 Vaccination 04/19/2019, 05/03/2019, 11/16/2019   Pneumococcal Conjugate-13 10/03/2014   Pneumococcal Polysaccharide-23 05/08/2015   Tdap 10/03/2014   Zoster, Live 05/30/2011    Depression        This is a chronic problem.The problem is unchanged.  Associated symptoms include no fatigue, no appetite change and no headaches.  Past treatments include SNRIs - Serotonin and norepinephrine reuptake inhibitors.  Compliance with treatment is good. Hyperlipidemia This is a chronic problem. The problem is controlled. Associated symptoms include chest pain (sticking pain in ribs) and shortness of breath (mild, deep breath restricted). Current antihyperlipidemic treatment includes statins.   Lab Results  Component Value Date   CREATININE 0.96 05/22/2020   BUN 11 05/22/2020   NA 141 05/22/2020   K 4.2 05/22/2020   CL 104 05/22/2020   CO2 21 05/22/2020   Lab Results  Component Value Date   CHOL 142 05/22/2020   HDL 49 05/22/2020   LDLCALC 71 05/22/2020   TRIG 122 05/22/2020   CHOLHDL 2.9 05/22/2020   Lab Results  Component Value Date   TSH 3.710 11/17/2018   Lab Results  Component Value Date   HGBA1C 6.3 (H) 05/22/2020   Lab Results  Component Value  Date   WBC 5.2 06/07/2019   HGB 12.8 06/07/2019   HCT 37.6 06/07/2019   MCV 91 06/07/2019   PLT 313 06/07/2019   Lab Results  Component Value Date   ALT 23 05/22/2020   AST 23 05/22/2020   ALKPHOS 96 05/22/2020   BILITOT 0.5 05/22/2020     Review of Systems  Constitutional:  Negative for appetite change, chills, fatigue and fever.  HENT:  Negative for congestion, hearing loss, tinnitus, trouble swallowing and voice change.   Eyes:  Negative for visual disturbance.  Respiratory:  Positive for shortness of breath (mild, deep breath restricted). Negative for cough, chest tightness and wheezing.   Cardiovascular:  Positive for chest pain (sticking pain in ribs). Negative for palpitations and leg swelling.  Gastrointestinal:  Negative for abdominal pain, constipation, diarrhea and vomiting.  Endocrine: Negative for polydipsia and polyuria.  Genitourinary:  Negative for dysuria, frequency, genital sores, vaginal bleeding and vaginal discharge.  Musculoskeletal:  Negative for arthralgias, gait problem and joint swelling.  Skin:  Negative for color change and rash.  Neurological:  Negative for dizziness, tremors, light-headedness and headaches.  Hematological:  Negative for adenopathy. Does not bruise/bleed easily.  Psychiatric/Behavioral:  Positive for depression. Negative for dysphoric mood and sleep disturbance. The patient is not nervous/anxious.    Patient Active Problem List   Diagnosis Date Noted   Keratosis 09/18/2020   Carpal tunnel syndrome on right 03/20/2020   Cerebral microvascular disease 11/10/2019   Pleural effusion, left 10/19/2019   Aortic atherosclerosis (HCC) 10/08/2019   Personal history  of colonic polyps    Insomnia 11/05/2016   Osteoarthritis of left hip 10/10/2015   Status post left hip replacement 10/10/2015   Prediabetes 01/03/2015   Anxiety 09/01/2014   Vitamin D deficiency 09/01/2014   Herpes zoster 09/01/2014   Fibromyalgia 11/15/2013   Recurrent  major depressive disorder, in partial remission (HCC) 11/15/2013   Vitamin B12 deficiency 12/23/2011   Allergic rhinitis 04/18/2011    Allergies  Allergen Reactions   Amoxicillin-Pot Clavulanate Nausea And Vomiting and Other (See Comments)    Other reaction(s): Vomiting   Beef-Derived Products Nausea And Vomiting   Pegademase Bovine Nausea And Vomiting   Penicillins Nausea And Vomiting and Other (See Comments)    Has patient had a PCN reaction causing immediate rash, facial/tongue/throat swelling, SOB or lightheadedness with hypotension: no Has patient had a PCN reaction causing severe rash involving mucus membranes or skin necrosis: No Has patient had a PCN reaction that required hospitalization No Has patient had a PCN reaction occurring within the last 10 years: Yes If all of the above answers are "NO", then may proceed with Cephalosporin use.         Past Surgical History:  Procedure Laterality Date   BLADDER REPAIR     COLON SURGERY  2011   10 inches removed for benign mass, does not absorb nutrients as well   COLONOSCOPY  2011   polyp removed- Dr Marva Panda   COLONOSCOPY WITH PROPOFOL N/A 07/04/2017   Procedure: COLONOSCOPY WITH PROPOFOL;  Surgeon: Midge Minium, MD;  Location: [facility];  Service: Endoscopy;  Laterality: N/A;  recurrent shingles   NASAL SINUS SURGERY     TONSILLECTOMY     TOTAL HIP ARTHROPLASTY Left 10/10/2015   Procedure: LEFT TOTAL HIP ARTHROPLASTY ANTERIOR APPROACH;  Surgeon: Kathryne Hitch, MD;  Location: [facility] OR;  Service: Orthopedics;  Laterality: Left;   TRIGGER FINGER RELEASE Left 03/28/2017   Procedure: MIDDLE FINGER DUPREYTRENS RELEASE;  Surgeon: Erin Sons, MD;  Location: [facility];  Service: Orthopedics;  Laterality: Left;   TUBAL LIGATION     TURBINATE REDUCTION Bilateral 04/29/2019   Procedure: TURBINATE REDUCTION WITH VIVEAR;  Surgeon: Vernie Murders, MD;  Location: [facility];  Service: ENT;  Laterality:  Bilateral;  vivear   VAGINAL HYSTERECTOMY     partial    Social History   Tobacco Use   Smoking status: Never   Smokeless tobacco: Never  Vaping Use   Vaping Use: Never used  Substance Use Topics   Alcohol use: No    Alcohol/week: 0.0 standard drinks   Drug use: No     Medication list has been reviewed and updated.  Current Meds  Medication Sig   atorvastatin (LIPITOR) 10 MG tablet TAKE 1 TABLET(10 MG) BY MOUTH DAILY   azelastine (OPTIVAR) 0.05 % ophthalmic solution Place 1 drop into both eyes 2 (two) times daily. (Patient taking differently: Place 1 drop into both eyes as needed.)   Cholecalciferol (VITAMIN D3) 5000 UNITS CAPS Take 1 capsule (5,000 Units total) by mouth daily.   Cyanocobalamin (VITAMIN B-12) 5000 MCG SUBL Place 15,000 mcg under the tongue daily.   DULoxetine (CYMBALTA) 60 MG capsule TAKE 2 CAPSULES BY MOUTH DAILY   fluticasone (FLONASE) 50 MCG/ACT nasal spray Place 2 sprays into both nostrils daily.   guaiFENesin (MUCINEX PO) Take by mouth as needed.   Multiple Vitamins-Minerals (MULTIVITAMIN WOMEN 50+) TABS Take 1 tablet by mouth daily.   sodium chloride (OCEAN) 0.65 % SOLN nasal spray Place 1  spray into both nostrils as needed for congestion.   traZODone (DESYREL) 100 MG tablet TAKE 2 TABLETS(200 MG) BY MOUTH AT BEDTIME   valACYclovir (VALTREX) 1000 MG tablet TAKE 1 TABLET BY MOUTH EVERY DAY (Patient taking differently: as needed.)   [DISCONTINUED] Fluticasone-Salmeterol,sensor, (AIRDUO DIGIHALER) 113-14 MCG/ACT AEPB Inhale 1 puff into the lungs 2 (two) times daily.    PHQ 2/9 Scores 11/21/2020 09/18/2020 07/13/2020 07/12/2020  PHQ - 2 Score 0 0 0 0  PHQ- 9 Score 0 0 2 0    GAD 7 : Generalized Anxiety Score 11/21/2020 09/18/2020 07/13/2020 07/12/2020  Nervous, Anxious, on Edge 0 1 0 0  Control/stop worrying 0 0 0 0  Worry too much - different things 0 0 0 0  Trouble relaxing 0 0 0 0  Restless 0 0 0 0  Easily annoyed or irritable 0 0 0 0  Afraid - awful might  happen 0 1 0 0  Total GAD 7 Score 0 2 0 0  Anxiety Difficulty - - - Not difficult at all    BP Readings from Last 3 Encounters:  11/21/20 128/82  10/26/20 118/76  09/18/20 112/74    Physical Exam Vitals and nursing note reviewed.  Constitutional:      General: She is not in acute distress.    Appearance: She is well-developed.  HENT:     Head: Normocephalic and atraumatic.     Right Ear: Tympanic membrane and ear canal normal.     Left Ear: Tympanic membrane and ear canal normal.     Nose:     Right Sinus: No maxillary sinus tenderness.     Left Sinus: No maxillary sinus tenderness.  Eyes:     General: No scleral icterus.       Right eye: No discharge.        Left eye: No discharge.     Conjunctiva/sclera: Conjunctivae normal.  Neck:     Thyroid: No thyromegaly.     Vascular: No carotid bruit.  Cardiovascular:     Rate and Rhythm: Normal rate and regular rhythm.     Pulses: Normal pulses.     Heart sounds: Normal heart sounds.  Pulmonary:     Effort: Pulmonary effort is normal. No respiratory distress.     Breath sounds: No wheezing.  Chest:  Breasts:    Right: No mass, nipple discharge, skin change or tenderness.     Left: No mass, nipple discharge, skin change or tenderness.  Abdominal:     General: Bowel sounds are normal.     Palpations: Abdomen is soft.     Tenderness: There is no abdominal tenderness.  Musculoskeletal:        General: Normal range of motion.     Cervical back: Normal range of motion. No erythema.     Right lower leg: No edema.     Left lower leg: No edema.  Lymphadenopathy:     Cervical: No cervical adenopathy.  Skin:    General: Skin is warm and dry.     Findings: No rash.  Neurological:     Mental Status: She is alert and oriented to person, place, and time.     Cranial Nerves: No cranial nerve deficit.     Sensory: No sensory deficit.     Deep Tendon Reflexes: Reflexes are normal and symmetric.  Psychiatric:        Attention and  Perception: Attention normal.        Mood and Affect: Mood normal.  Wt Readings from Last 3 Encounters:  11/21/20 237 lb (107.5 kg)  10/26/20 237 lb (107.5 kg)  09/18/20 235 lb (106.6 kg)    BP 128/82   Pulse 82   Temp 97.7 F (36.5 C) (Oral)   Ht 5\' 7"  (1.702 m)   Wt 237 lb (107.5 kg)   SpO2 95%   BMI 37.12 kg/m   Assessment and Plan: 1. Aortic atherosclerosis (HCC) Tolerating statin medication without side effects at this time LDL is at goal of < 70 on current dose Continue same therapy without change at this time. - Lipid panel  2. Annual physical exam Up to date on screenings and immunizations  3. Encounter for screening mammogram for breast cancer Schedule mammogram at [facility] - MM 3D SCREEN BREAST BILATERAL  4. Recurrent major depressive disorder, in partial remission (HCC) Clinically stable on current regimen with good control of symptoms, No SI or HI. Will continue current therapy. - TSH  5. Prediabetes Check labs and advise - continue to work on diet and weight loss - Hemoglobin A1c - Basic metabolic panel  6. Need for shingles vaccine - Zoster Vaccine Adjuvanted [facility]) injection; Inject 0.5 mLs into the muscle once for 1 dose.  Dispense: 0.5 mL; Refill: 1  7. Cerebral microvascular disease On statin therapy for findings on CT  8. Restless legs syndrome (RLS) Long standing sx that are slowly worsening Will try Requip 0.25 mg and titrate up weekly if needed to max of 0.75 mg - rOPINIRole (REQUIP) 0.25 MG tablet; Take 1 tablet (0.25 mg total) by mouth at bedtime.  Dispense: 90 tablet; Refill: 1   Partially dictated using [facility]. Any errors are unintentional.  Animal nutritionist, MD [facility]  11/21/2020

## 2020-11-22 DIAGNOSIS — M67441 Ganglion, right hand: Secondary | ICD-10-CM | POA: Diagnosis not present

## 2020-11-22 LAB — LIPID PANEL
Chol/HDL Ratio: 2.8 ratio (ref 0.0–4.4)
Cholesterol, Total: 171 mg/dL (ref 100–199)
HDL: 61 mg/dL (ref >39)
LDL Chol Calc (NIH): 84 mg/dL (ref 0–99)
Triglycerides: 150 mg/dL — ABNORMAL HIGH (ref 0–149)
VLDL Cholesterol Cal: 26 mg/dL (ref 5–40)

## 2020-11-22 LAB — BASIC METABOLIC PANEL
BUN/Creatinine Ratio: 13 (ref 12–28)
BUN: 13 mg/dL (ref 8–27)
CO2: 21 mmol/L (ref 20–29)
Calcium: 9.5 mg/dL (ref 8.7–10.3)
Chloride: 104 mmol/L (ref 96–106)
Creatinine, Ser: 1.01 mg/dL — ABNORMAL HIGH (ref 0.57–1.00)
Glucose: 112 mg/dL — ABNORMAL HIGH (ref 70–99)
Potassium: 4.3 mmol/L (ref 3.5–5.2)
Sodium: 145 mmol/L — ABNORMAL HIGH (ref 134–144)
eGFR: 60 mL/min/{1.73_m2} (ref >59)

## 2020-11-22 LAB — HEMOGLOBIN A1C
Est. average glucose Bld gHb Est-mCnc: 137 mg/dL
Hgb A1c MFr Bld: 6.4 % — ABNORMAL HIGH (ref 4.8–5.6)

## 2020-11-22 LAB — TSH: TSH: 3.39 u[IU]/mL (ref 0.450–4.500)

## 2020-11-27 ENCOUNTER — Other Ambulatory Visit: Payer: Self-pay

## 2020-11-27 ENCOUNTER — Other Ambulatory Visit
Admission: RE | Admit: 2020-11-27 | Discharge: 2020-11-27 | Disposition: A | Discharge status: Home / Self Care | Payer: Medicare Other | Source: Ambulatory Visit | Attending: Pulmonary Disease | Admitting: Pulmonary Disease

## 2020-11-27 ENCOUNTER — Ambulatory Visit (INDEPENDENT_AMBULATORY_CARE_PROVIDER_SITE_OTHER): Payer: Medicare Other | Admitting: Pulmonary Disease

## 2020-11-27 VITALS — BP 130/86 | HR 84 | Temp 96.9°F | Ht 68.0 in | Wt 238.9 lb

## 2020-11-27 DIAGNOSIS — Z01812 Encounter for preprocedural laboratory examination: Secondary | ICD-10-CM | POA: Insufficient documentation

## 2020-11-27 DIAGNOSIS — Z20822 Contact with and (suspected) exposure to covid-19: Secondary | ICD-10-CM | POA: Insufficient documentation

## 2020-11-27 DIAGNOSIS — J90 Pleural effusion, not elsewhere classified: Secondary | ICD-10-CM | POA: Diagnosis not present

## 2020-11-27 DIAGNOSIS — R0602 Shortness of breath: Secondary | ICD-10-CM

## 2020-11-27 MED ORDER — ALBUTEROL SULFATE HFA 108 (90 BASE) MCG/ACT IN AERS: 2.0000 | INHALATION_SPRAY | Freq: Four times a day (QID) | RESPIRATORY_TRACT | 2 refills | Status: DC | PRN

## 2020-11-27 NOTE — Progress Notes (Signed)
Subjective:    Patient ID: Ann Peters, female    DOB: 1950/08/09, 70 y.o.   MRN: [id number]  Chief Complaint  Patient presents with   Follow-up   HPI Ann Peters is a 70 year old lifelong never smoker who follows here for the issue of dyspnea.  Prior pleural effusion noted in July 2021.  This is a scheduled visit.  The patient was seen on 26 October 2020 with recurrent flulike symptoms for approximately 2 weeks, negative COVID status, and left-sided discomfort.  She was concerned that her pleural effusion had come back.  Recall that she had a pleuropericarditis in July 2021 with extensive work-up that yielded no exact etiology.  This resolved with colchicine and prednisone.  Since that time the patient has done well however she noted symptoms in the earlier part of September.  Chest x-ray at that time did not show evidence of recurrence of effusion.  She responded to prednisone, antibiotics and because of mild bronchospasm she was placed on Air Duo inhaler.  She was provided this is a sample.  The patient states that using that inhaler really helped her a lot and that she felt her dyspnea was relieved by it.  She has not had any fevers, chills or sweats.  She now feels that she is back to baseline.  She has no other complaints or concerns today.  Overall he feels well and looks well.   Review of Systems A 10 point review of systems was performed and it is as noted above otherwise negative.  Patient Active Problem List   Diagnosis Date Noted   Restless legs syndrome (RLS) 11/21/2020   Keratosis 09/18/2020   Carpal tunnel syndrome on right 03/20/2020   Cerebral microvascular disease 11/10/2019   Pleural effusion, left 10/19/2019   Aortic atherosclerosis (HCC) 10/08/2019   Personal history of colonic polyps    Insomnia 11/05/2016   Osteoarthritis of left hip 10/10/2015   Status post left hip replacement 10/10/2015   Prediabetes 01/03/2015   Anxiety 09/01/2014   Vitamin D deficiency  09/01/2014   Herpes zoster 09/01/2014   Fibromyalgia 11/15/2013   Recurrent major depressive disorder, in partial remission (HCC) 11/15/2013   Vitamin B12 deficiency 12/23/2011   Allergic rhinitis 04/18/2011   Social History   Tobacco Use   Smoking status: Never   Smokeless tobacco: Never  Substance Use Topics   Alcohol use: No    Alcohol/week: 0.0 standard drinks   Allergies  Allergen Reactions   Amoxicillin-Pot Clavulanate Nausea And Vomiting and Other (See Comments)    Other reaction(s): Vomiting   Beef-Derived Products Nausea And Vomiting   Pegademase Bovine Nausea And Vomiting   Penicillins Nausea And Vomiting and Other (See Comments)    Has patient had a PCN reaction causing immediate rash, facial/tongue/throat swelling, SOB or lightheadedness with hypotension: no Has patient had a PCN reaction causing severe rash involving mucus membranes or skin necrosis: No Has patient had a PCN reaction that required hospitalization No Has patient had a PCN reaction occurring within the last 10 years: Yes If all of the above answers are "NO", then may proceed with Cephalosporin use.        Current Meds  Medication Sig   albuterol (VENTOLIN HFA) 108 (90 Base) MCG/ACT inhaler Inhale 2 puffs into the lungs every 6 (six) hours as needed for wheezing or shortness of breath.   atorvastatin (LIPITOR) 10 MG tablet TAKE 1 TABLET(10 MG) BY MOUTH DAILY   azelastine (OPTIVAR) 0.05 % ophthalmic solution  Place 1 drop into both eyes 2 (two) times daily. (Patient taking differently: Place 1 drop into both eyes as needed.)   Cholecalciferol (VITAMIN D3) 5000 UNITS CAPS Take 1 capsule (5,000 Units total) by mouth daily.   Cyanocobalamin (VITAMIN B-12) 5000 MCG SUBL Place 15,000 mcg under the tongue daily.   DULoxetine (CYMBALTA) 60 MG capsule TAKE 2 CAPSULES BY MOUTH DAILY   fluticasone (FLONASE) 50 MCG/ACT nasal spray Place 2 sprays into both nostrils daily.   guaiFENesin (MUCINEX PO) Take by  mouth as needed.   Multiple Vitamins-Minerals (MULTIVITAMIN WOMEN 50+) TABS Take 1 tablet by mouth daily.   rOPINIRole (REQUIP) 0.25 MG tablet Take 1 tablet (0.25 mg total) by mouth at bedtime.   sodium chloride (OCEAN) 0.65 % SOLN nasal spray Place 1 spray into both nostrils as needed for congestion.   traZODone (DESYREL) 100 MG tablet TAKE 2 TABLETS(200 MG) BY MOUTH AT BEDTIME   valACYclovir (VALTREX) 1000 MG tablet Take 1 tablet (1,000 mg total) by mouth daily.   Immunization History  Administered Date(s) Administered   Fluad Quad(high Dose 65+) 11/17/2018, 11/10/2019, 11/21/2020   Influenza, High Dose Seasonal PF 11/05/2016, 02/04/2018   Influenza,inj,Quad PF,6+ Mos 01/03/2015, 01/22/2016   PFIZER(Purple Top)SARS-COV-2 Vaccination 03/23/2019, 05/03/2019, 11/16/2019   Pneumococcal Conjugate-13 10/03/2014   Pneumococcal Polysaccharide-23 05/08/2015   Tdap 10/03/2014   Zoster, Live 05/30/2011      Objective:   Physical Exam BP 130/86 (BP Location: Right Arm, Patient Position: Sitting, Cuff Size: Normal)   Pulse 84   Temp (!) 96.9 F (36.1 C)   Ht 5\' 8"  (1.727 m)   Wt 238 lb 14.4 oz (108.4 kg)   SpO2 97%   BMI 36.32 kg/m   GENERAL: Obese woman, alert, no acute distress.  No tachypnea noted. HEAD: Normocephalic, atraumatic.  EYES: Pupils equal, round, reactive to light.  No scleral icterus.  MOUTH: Nose/mouth/throat not examined due to masking requirements for COVID 19. NECK: Supple. No thyromegaly. Trachea midline. No JVD.  No adenopathy. PULMONARY: Good air entry bilaterally, symmetrical.  No adventitious sounds, clear lungs. CARDIOVASCULAR: S1 and S2. Regular rate and rhythm.  No rubs, murmurs or gallops heard. GASTROINTESTINAL: Abdomen is obese otherwise unremarkable. MUSCULOSKELETAL: No joint deformity, no clubbing, no edema.   NEUROLOGIC: No focal deficit noted.  Fluent speech.  No gait disturbance noted. SKIN: Intact,warm,dry.  Limited exam no rashes. PSYCH: Mood and  behavior are normal.     Assessment & Plan:     ICD-10-CM   1. Shortness of breath  R06.02 Pulmonary Function Test [facility] Only   Will obtain PFTs Notes significant improvement with bronchodilator Query asthma    2. Pleural effusion, left - NO RECURRENCE  J90    No evidence of recurrent pleural effusion Recent bronchitis, resolved     Orders Placed This Encounter  Procedures   Pulmonary Function Test [facility] Only    Standing Status:   Future    Number of Occurrences:   1    Standing Expiration Date:   11/27/2021    Order Specific Question:   Full PFT: includes the following: basic spirometry, spirometry pre & post bronchodilator, diffusion capacity (DLCO), lung volumes    Answer:   Full PFT   Meds ordered this encounter  Medications   albuterol (VENTOLIN HFA) 108 (90 Base) MCG/ACT inhaler    Sig: Inhale 2 puffs into the lungs every 6 (six) hours as needed for wheezing or shortness of breath.    Dispense:  8 g  Refill:  2   The patient notes ongoing issues with shortness of breath.  She did note that inhaler provided for her at her prior visit [employer]) really helped her tremendously, however not covered by her insurance company.  Query asthma.  Will obtain PFTs.  We will give her an albuterol inhaler to use in the interim.  See her in follow-up in 2 to 3 months time she is to contact us prior to that time should any new difficulties arise.  Gailen Shelter, MD [facility]    *This note was dictated using voice recognition software/Dragon.  Despite best efforts to proofread, errors can occur which can change the meaning.  Any change was purely unintentional.

## 2020-11-27 NOTE — Patient Instructions (Signed)
Giving you an inhaler called albuterol as needed.   We are checking breathing tests.     We will see him in follow-up and 2 to 3 months time call sooner should any new problems arise.

## 2020-11-28 ENCOUNTER — Encounter: Payer: Self-pay | Admitting: Pulmonary Disease

## 2020-11-28 ENCOUNTER — Ambulatory Visit: Payer: Medicare Other | Attending: Pulmonary Disease

## 2020-11-28 DIAGNOSIS — R0600 Dyspnea, unspecified: Secondary | ICD-10-CM | POA: Insufficient documentation

## 2020-11-28 DIAGNOSIS — R0602 Shortness of breath: Secondary | ICD-10-CM | POA: Diagnosis not present

## 2020-11-28 DIAGNOSIS — R059 Cough, unspecified: Secondary | ICD-10-CM | POA: Diagnosis not present

## 2020-11-28 LAB — SARS CORONAVIRUS 2 (TAT 6-24 HRS): SARS Coronavirus 2: NEGATIVE

## 2020-11-28 MED ORDER — ALBUTEROL SULFATE (2.5 MG/3ML) 0.083% IN NEBU
2.5000 mg | INHALATION_SOLUTION | Freq: Once | RESPIRATORY_TRACT | Status: AC
  Administered 2020-11-28: 2.5 mg via RESPIRATORY_TRACT
  Filled 2020-11-28: qty 3

## 2020-12-21 ENCOUNTER — Other Ambulatory Visit: Payer: Self-pay | Admitting: Internal Medicine

## 2020-12-21 DIAGNOSIS — I700 Atherosclerosis of aorta: Secondary | ICD-10-CM

## 2020-12-21 DIAGNOSIS — I6789 Other cerebrovascular disease: Secondary | ICD-10-CM

## 2020-12-22 NOTE — Telephone Encounter (Signed)
Requested Prescriptions  Pending Prescriptions Disp Refills  . atorvastatin (LIPITOR) 10 MG tablet [Pharmacy Med Name: ATORVASTATIN 10MG  TABLETS] 90 tablet 3    Sig: TAKE 1 TABLET(10 MG) BY MOUTH DAILY     Cardiovascular:  Antilipid - Statins Failed - 12/21/2020  7:59 PM      Failed - Triglycerides in normal range and within 360 days    Triglycerides  Date Value Ref Range Status  11/21/2020 150 (H) 0 - 149 mg/dL Final         Passed - Total Cholesterol in normal range and within 360 days    Cholesterol, Total  Date Value Ref Range Status  11/21/2020 171 100 - 199 mg/dL Final         Passed - LDL in normal range and within 360 days    LDL Chol Calc (NIH)  Date Value Ref Range Status  11/21/2020 84 0 - 99 mg/dL Final         Passed - HDL in normal range and within 360 days    HDL  Date Value Ref Range Status  11/21/2020 61 >39 mg/dL Final         Passed - Patient is not pregnant      Passed - Valid encounter within last 12 months    Recent Outpatient Visits          1 month ago Aortic atherosclerosis [facility])   [facility], MD   3 months ago [facility], MD   5 months ago COVID-19 virus infection   [facility], MD   7 months ago Fibromyalgia   [facility], MD   9 months ago Fibromyalgia   [facility], MD      Future Appointments            In 11 months Reubin Milan Judithann Graves, MD [facility]

## 2020-12-26 ENCOUNTER — Ambulatory Visit
Admission: EM | Admit: 2020-12-26 | Discharge: 2020-12-26 | Disposition: A | Discharge status: Home / Self Care | Payer: Medicare Other | Attending: Physician Assistant | Admitting: Physician Assistant

## 2020-12-26 ENCOUNTER — Other Ambulatory Visit: Payer: Self-pay

## 2020-12-26 DIAGNOSIS — R051 Acute cough: Secondary | ICD-10-CM | POA: Diagnosis not present

## 2020-12-26 DIAGNOSIS — H60393 Other infective otitis externa, bilateral: Secondary | ICD-10-CM | POA: Diagnosis not present

## 2020-12-26 DIAGNOSIS — H1033 Unspecified acute conjunctivitis, bilateral: Secondary | ICD-10-CM | POA: Diagnosis not present

## 2020-12-26 DIAGNOSIS — J019 Acute sinusitis, unspecified: Secondary | ICD-10-CM | POA: Diagnosis not present

## 2020-12-26 MED ORDER — MOXIFLOXACIN HCL 0.5 % OP SOLN: 1.0000 [drp] | Freq: Three times a day (TID) | OPHTHALMIC | 0 refills | Status: AC

## 2020-12-26 MED ORDER — CEFDINIR 300 MG PO CAPS: 300.0000 mg | ORAL_CAPSULE | Freq: Two times a day (BID) | ORAL | 0 refills | Status: AC

## 2020-12-26 MED ORDER — OFLOXACIN 0.3 % OT SOLN: 10.0000 [drp] | Freq: Every day | OTIC | 0 refills | Status: AC

## 2020-12-26 MED ORDER — BENZONATATE 200 MG PO CAPS: 200.0000 mg | ORAL_CAPSULE | Freq: Three times a day (TID) | ORAL | 0 refills | Status: DC | PRN

## 2020-12-26 NOTE — ED Triage Notes (Signed)
Patient presents to Urgent Care with complaints of sore throat, facial pressure,  bilateral ear pain, cough, and nasal congestion x 1 week. Treating symptoms with otc decongestant.   Denies fever.

## 2020-12-26 NOTE — Discharge Instructions (Signed)
-  I have sent antibiotic for your sinus infection.  Continue with Mucinex and nasal sprays. - I have also sent antibiotic eyedrops and antibiotic eardrops. - Make sure to increase rest and fluids. - If you are not feeling better in the next week you should be seen again or if you develop a fever, worsening cough or breathing difficulty.

## 2020-12-26 NOTE — ED Provider Notes (Signed)
[facility]    CSN: [id number] Arrival date & time: 12/26/20  0801      History   Chief Complaint Chief Complaint  Patient presents with   Sore Throat   Cough   Nasal Congestion    HPI Ann Peters is a 71 y.o. female presenting for a little over 1 week history of sore throat, nasal congestion, sinus pain, bilateral ear pain, cough and nasal congestion.  Patient says over the last 2 days she has developed yellowish drainage from both of her eyes and ears.  She also reports that her ears have become more painful.  She denies any chest pain or breathing difficulty.  No vomiting or diarrhea.  Her husband is ill with similar symptoms.  No known COVID or influenza exposure.  She has been taking over-the-counter cough suppressants/decongestants but says they have not helped and she feels worse every day.  Past medical history significant for allergies, anxiety, depression, cerebrovascular disease and prediabetes.  HPI  Past Medical History:  Diagnosis Date   Allergy    Anemia 02/17/2017   Anxiety    Depression    Diverticulitis 09/01/2014   History of shingles    ON AND OFF 6 YEARS, recurrent episodes of internal and external shingles with resulting neuralgia   Neuromuscular disorder (HCC)    POSTHERPETIC NEURALGIA, left wrist weakness   Pneumonia    HX   SEVERAL TIMES  NONE IN 3 YRS   Post herpetic neuralgia    Scoliosis    Shingles    history of.  Recurrent   Sinusitis 10/18/2016    Patient Active Problem List   Diagnosis Date Noted   Restless legs syndrome (RLS) 11/21/2020   Keratosis 09/18/2020   Carpal tunnel syndrome on right 03/20/2020   Cerebral microvascular disease 11/10/2019   Pleural effusion, left 10/19/2019   Aortic atherosclerosis ([place]) 10/08/2019   Personal history of colonic polyps    Insomnia 11/05/2016   Osteoarthritis of left hip 10/10/2015   Status post left hip replacement 10/10/2015   Prediabetes 01/03/2015   Anxiety 09/01/2014    Vitamin D deficiency 09/01/2014   Herpes zoster 09/01/2014   Fibromyalgia 11/15/2013   Recurrent major depressive disorder, in partial remission ([place]) 11/15/2013   Vitamin B12 deficiency 12/23/2011   Allergic rhinitis 04/18/2011    Past Surgical History:  Procedure Laterality Date   BLADDER REPAIR     COLON SURGERY  2011   10 inches removed for benign mass, does not absorb nutrients as well   COLONOSCOPY  2011   polyp removed- Dr Gustavo Lah   COLONOSCOPY WITH PROPOFOL N/A 07/04/2017   Procedure: COLONOSCOPY WITH PROPOFOL;  Surgeon: Lucilla Lame, MD;  Location: [place];  Service: Endoscopy;  Laterality: N/A;  recurrent shingles   NASAL SINUS SURGERY     TONSILLECTOMY     TOTAL HIP ARTHROPLASTY Left 10/10/2015   Procedure: LEFT TOTAL HIP ARTHROPLASTY ANTERIOR APPROACH;  Surgeon: Mcarthur Rossetti, MD;  Location: [place];  Service: Orthopedics;  Laterality: Left;   TRIGGER FINGER RELEASE Left 03/28/2017   Procedure: MIDDLE FINGER DUPREYTRENS RELEASE;  Surgeon: Leanor Kail, MD;  Location: [place];  Service: Orthopedics;  Laterality: Left;   TUBAL LIGATION     TURBINATE REDUCTION Bilateral 04/29/2019   Procedure: TURBINATE REDUCTION WITH VIVEAR;  Surgeon: Margaretha Sheffield, MD;  Location: [place];  Service: ENT;  Laterality: Bilateral;  vivear   VAGINAL HYSTERECTOMY     partial    OB History  Gravida  4   Para      Term      Preterm      AB  1   Living         SAB  1   IAB      Ectopic      Multiple      Live Births               Home Medications    Prior to Admission medications   Medication Sig Start Date End Date Taking? Authorizing Provider  benzonatate (TESSALON) 200 MG capsule Take 1 capsule (200 mg total) by mouth 3 (three) times daily as needed for cough. 12/26/20  Yes Eusebio Friendly B, PA-C  cefdinir (OMNICEF) 300 MG capsule Take 1 capsule (300 mg total) by mouth 2 (two) times daily for 7 days. 12/26/20 01/02/21  Yes Eusebio Friendly B, PA-C  moxifloxacin (VIGAMOX) 0.5 % ophthalmic solution Place 1 drop into both eyes 3 (three) times daily for 7 days. 12/26/20 01/02/21 Yes Shirlee Latch, PA-C  ofloxacin (FLOXIN) 0.3 % OTIC solution Place 10 drops into both ears daily for 7 days. 12/26/20 01/02/21 Yes Shirlee Latch, PA-C  albuterol (VENTOLIN HFA) 108 (90 Base) MCG/ACT inhaler Inhale 2 puffs into the lungs every 6 (six) hours as needed for wheezing or shortness of breath. 11/27/20   Salena Saner, MD  atorvastatin (LIPITOR) 10 MG tablet TAKE 1 TABLET(10 MG) BY MOUTH DAILY 12/22/20   Reubin Milan, MD  azelastine (OPTIVAR) 0.05 % ophthalmic solution Place 1 drop into both eyes 2 (two) times daily. Patient taking differently: Place 1 drop into both eyes as needed. 02/18/20   Tommie Sams, DO  Cholecalciferol (VITAMIN D3) 5000 UNITS CAPS Take 1 capsule (5,000 Units total) by mouth daily. 09/01/14   Plonk, Chrissie Noa, MD  Cyanocobalamin (VITAMIN B-12) 5000 MCG SUBL Place 15,000 mcg under the tongue daily.    [provider]  DULoxetine (CYMBALTA) 60 MG capsule TAKE 2 CAPSULES BY MOUTH DAILY 11/13/20   Reubin Milan, MD  fluticasone [facility]) 50 MCG/ACT nasal spray Place 2 sprays into both nostrils daily. 09/17/16   Lutricia Feil, PA-C  guaiFENesin (MUCINEX PO) Take by mouth as needed.    [provider]  Multiple Vitamins-Minerals (MULTIVITAMIN WOMEN 50+) TABS Take 1 tablet by mouth daily.    [provider]  rOPINIRole (REQUIP) 0.25 MG tablet Take 1 tablet (0.25 mg total) by mouth at bedtime. 11/21/20   Reubin Milan, MD  sodium chloride (OCEAN) 0.65 % SOLN nasal spray Place 1 spray into both nostrils as needed for congestion.    [provider]  traZODone (DESYREL) 100 MG tablet TAKE 2 TABLETS(200 MG) BY MOUTH AT BEDTIME 10/25/20   Reubin Milan, MD  valACYclovir (VALTREX) 1000 MG tablet Take 1 tablet (1,000 mg total) by mouth daily. 11/21/20   Reubin Milan,  MD    Family History Family History  Problem Relation Age of Onset   Lymphoma Mother    Sarcoidosis Mother    CAD Father    Breast cancer Sister 77   Brain cancer Sister     Social History Social History   Tobacco Use   Smoking status: Never   Smokeless tobacco: Never  Vaping Use   Vaping Use: Never used  Substance Use Topics   Alcohol use: No    Alcohol/week: 0.0 standard drinks   Drug use: No     Allergies  Amoxicillin-pot clavulanate, Beef-derived products, Pegademase bovine, and Penicillins   Review of Systems Review of Systems  Constitutional:  Positive for fatigue. Negative for chills, diaphoresis and fever.  HENT:  Positive for congestion, ear discharge, ear pain, rhinorrhea, sinus pressure, sinus pain and sore throat.   Eyes:  Positive for discharge, redness and itching.  Respiratory:  Positive for cough. Negative for shortness of breath.   Gastrointestinal:  Negative for abdominal pain, nausea and vomiting.  Musculoskeletal:  Negative for arthralgias and myalgias.  Skin:  Negative for rash.  Neurological:  Negative for weakness and headaches.  Hematological:  Negative for adenopathy.  Psychiatric/Behavioral:  Positive for sleep disturbance.     Physical Exam Triage Vital Signs ED Triage Vitals [12/26/20 0818]  Enc Vitals Group     BP      Pulse      Resp      Temp      Temp src      SpO2      Weight      Height      Head Circumference      Peak Flow      Pain Score 8     Pain Loc      Pain Edu?      Excl. in [place]?    No data found.  Updated Vital Signs BP 117/69 (BP Location: Right Arm)   Pulse 84   Temp 97.8 F (36.6 C) (Oral)   Resp 16   SpO2 97%      Physical Exam Vitals and nursing note reviewed.  Constitutional:      General: She is not in acute distress.    Appearance: Normal appearance. She is ill-appearing. She is not toxic-appearing.  HENT:     Head: Normocephalic and atraumatic.     Right Ear: Tympanic membrane and  external ear normal. Drainage and swelling present.     Left Ear: Tympanic membrane and external ear normal. Drainage and swelling present.     Ears:     Comments: Thick yellow debris bilateral EACs    Nose: Mucosal edema, congestion and rhinorrhea present.     Right Sinus: Maxillary sinus tenderness present.     Left Sinus: Maxillary sinus tenderness present.     Mouth/Throat:     Mouth: Mucous membranes are moist.     Pharynx: Oropharynx is clear. Posterior oropharyngeal erythema present.  Eyes:     General: No scleral icterus.       Right eye: Discharge present.        Left eye: Discharge present.    Conjunctiva/sclera:     Right eye: Right conjunctiva is injected.     Left eye: Left conjunctiva is injected.     Comments: Thin yellow drainage bilaterally  Cardiovascular:     Rate and Rhythm: Normal rate and regular rhythm.     Heart sounds: Normal heart sounds.  Pulmonary:     Effort: Pulmonary effort is normal. No respiratory distress.     Breath sounds: Normal breath sounds.  Musculoskeletal:     Cervical back: Neck supple.  Skin:    General: Skin is dry.  Neurological:     General: No focal deficit present.     Mental Status: She is alert. Mental status is at baseline.     Motor: No weakness.     Gait: Gait normal.  Psychiatric:        Mood and Affect: Mood normal.  Behavior: Behavior normal.        Thought Content: Thought content normal.     UC Treatments / Results  Labs (all labs ordered are listed, but only abnormal results are displayed) Labs Reviewed - No data to display  EKG   Radiology No results found.  Procedures Procedures (including critical care time)  Medications Ordered in UC Medications - No data to display  Initial Impression / Assessment and Plan / UC Course  I have reviewed the triage vital signs and the nursing notes.  Pertinent labs & imaging results that were available during my care of the patient were reviewed by me and  considered in my medical decision making (see chart for details).  70 year old female presenting for a little more than 1 week history of nasal congestion, sinus pain and pressure, cough and 2-day history of bilateral eye drainage and redness as well as bilateral ear pain and drainage.  Vitals are stable.  Patient is ill-appearing but nontoxic.  Exam significant for sinus tenderness.  She also has bilateral eye drainage and ear drainage.  Suspect secondary bacterial sinusitis.  Treating with cefdinir as she has intolerance to penicillin that causes her nausea and vomiting.  Have also sent ofloxacin for otitis externa and Vigamox for conjunctivitis.  Advised to increase rest and fluids.  Sent benzonatate for cough.  Reviewed return and ED precautions with patient.  Final Clinical Impressions(s) / UC Diagnoses   Final diagnoses:  Acute sinusitis, recurrence not specified, unspecified location  Infective otitis externa of both ears  Acute bacterial conjunctivitis of both eyes  Acute cough     Discharge Instructions      -I have sent antibiotic for your sinus infection.  Continue with Mucinex and nasal sprays. - I have also sent antibiotic eyedrops and antibiotic eardrops. - Make sure to increase rest and fluids. - If you are not feeling better in the next week you should be seen again or if you develop a fever, worsening cough or breathing difficulty.     ED Prescriptions     Medication Sig Dispense Auth. Provider   cefdinir (OMNICEF) 300 MG capsule Take 1 capsule (300 mg total) by mouth 2 (two) times daily for 7 days. 14 capsule Laurene Footman B, PA-C   ofloxacin (FLOXIN) 0.3 % OTIC solution Place 10 drops into both ears daily for 7 days. 5 mL Laurene Footman B, PA-C   moxifloxacin (VIGAMOX) 0.5 % ophthalmic solution Place 1 drop into both eyes 3 (three) times daily for 7 days. 3 mL Laurene Footman B, PA-C   benzonatate (TESSALON) 200 MG capsule Take 1 capsule (200 mg total) by mouth 3  (three) times daily as needed for cough. 20 capsule Danton Clap, PA-C      PDMP not reviewed this encounter.   Danton Clap, PA-C 12/26/20 [phone]

## 2021-01-09 ENCOUNTER — Other Ambulatory Visit: Payer: Self-pay | Admitting: Surgery

## 2021-01-12 ENCOUNTER — Ambulatory Visit
Admission: EM | Admit: 2021-01-12 | Discharge: 2021-01-12 | Disposition: A | Discharge status: Home / Self Care | Payer: Medicare Other | Attending: Internal Medicine | Admitting: Internal Medicine

## 2021-01-12 ENCOUNTER — Encounter: Payer: Self-pay | Admitting: Licensed Clinical Social Worker

## 2021-01-12 ENCOUNTER — Other Ambulatory Visit: Payer: Self-pay

## 2021-01-12 DIAGNOSIS — J0191 Acute recurrent sinusitis, unspecified: Secondary | ICD-10-CM | POA: Diagnosis not present

## 2021-01-12 MED ORDER — LEVOFLOXACIN 500 MG PO TABS: 500.0000 mg | ORAL_TABLET | Freq: Every day | ORAL | 0 refills | Status: DC

## 2021-01-12 NOTE — ED Triage Notes (Signed)
Pt.c/o cough, facial pressurex 2 weeks, pt was seen here last week. Pt states she had a fever of 101. Ears popping x 2 weeks

## 2021-01-12 NOTE — ED Provider Notes (Signed)
[facility]    CSN: [id number] Arrival date & time: 01/12/21  0806      History   Chief Complaint Chief Complaint  Patient presents with   sinus pressure   Cough   Fever    HPI Ann Peters is a 70 y.o. female who presents due to having face pressure x 3 weeks. She was treated for OM and sinusitis on 11/8 with Cefdinir and Tessalon, but she states the pharmacist only gave her the Tessalon. She has continued doing saline rinses. She has been having fevers off and on up to 101 and getting clammy. Nose secretions are yellow. Her ears have been draining clear matter. She has hx of having 3 sinus surgeries in the past and her ENT usually prescribes Levaquin which she responds well to.     Past Medical History:  Diagnosis Date   Allergy    Anemia 02/17/2017   Anxiety    Depression    Diverticulitis 09/01/2014   History of shingles    ON AND OFF 6 YEARS, recurrent episodes of internal and external shingles with resulting neuralgia   Neuromuscular disorder (HCC)    POSTHERPETIC NEURALGIA, left wrist weakness   Pneumonia    HX   SEVERAL TIMES  NONE IN 3 YRS   Post herpetic neuralgia    Scoliosis    Shingles    history of.  Recurrent   Sinusitis 10/18/2016    Patient Active Problem List   Diagnosis Date Noted   Restless legs syndrome (RLS) 11/21/2020   Keratosis 09/18/2020   Carpal tunnel syndrome on right 03/20/2020   Cerebral microvascular disease 11/10/2019   Pleural effusion, left 10/19/2019   Aortic atherosclerosis ([place]) 10/08/2019   Personal history of colonic polyps    Insomnia 11/05/2016   Osteoarthritis of left hip 10/10/2015   Status post left hip replacement 10/10/2015   Prediabetes 01/03/2015   Anxiety 09/01/2014   Vitamin D deficiency 09/01/2014   Herpes zoster 09/01/2014   Fibromyalgia 11/15/2013   Recurrent major depressive disorder, in partial remission ([place]) 11/15/2013   Vitamin B12 deficiency 12/23/2011   Allergic rhinitis 04/18/2011     Past Surgical History:  Procedure Laterality Date   BLADDER REPAIR     COLON SURGERY  2011   10 inches removed for benign mass, does not absorb nutrients as well   COLONOSCOPY  2011   polyp removed- Dr Gustavo Lah   COLONOSCOPY WITH PROPOFOL N/A 07/04/2017   Procedure: COLONOSCOPY WITH PROPOFOL;  Surgeon: Lucilla Lame, MD;  Location: [place];  Service: Endoscopy;  Laterality: N/A;  recurrent shingles   NASAL SINUS SURGERY     TONSILLECTOMY     TOTAL HIP ARTHROPLASTY Left 10/10/2015   Procedure: LEFT TOTAL HIP ARTHROPLASTY ANTERIOR APPROACH;  Surgeon: Mcarthur Rossetti, MD;  Location: [place];  Service: Orthopedics;  Laterality: Left;   TRIGGER FINGER RELEASE Left 03/28/2017   Procedure: MIDDLE FINGER DUPREYTRENS RELEASE;  Surgeon: Leanor Kail, MD;  Location: [place];  Service: Orthopedics;  Laterality: Left;   TUBAL LIGATION     TURBINATE REDUCTION Bilateral 04/29/2019   Procedure: TURBINATE REDUCTION WITH VIVEAR;  Surgeon: Margaretha Sheffield, MD;  Location: [place];  Service: ENT;  Laterality: Bilateral;  vivear   VAGINAL HYSTERECTOMY     partial    OB History     Gravida  4   Para      Term      Preterm      AB  1   Living         SAB  1   IAB      Ectopic      Multiple      Live Births               Home Medications    Prior to Admission medications   Medication Sig Start Date End Date Taking? Authorizing Provider  albuterol (VENTOLIN HFA) 108 (90 Base) MCG/ACT inhaler Inhale 2 puffs into the lungs every 6 (six) hours as needed for wheezing or shortness of breath. 11/27/20   Salena Saner, MD  atorvastatin (LIPITOR) 10 MG tablet TAKE 1 TABLET(10 MG) BY MOUTH DAILY 12/22/20   Reubin Milan, MD  azelastine (OPTIVAR) 0.05 % ophthalmic solution Place 1 drop into both eyes 2 (two) times daily. Patient taking differently: Place 1 drop into both eyes as needed. 02/18/20   Tommie Sams, DO  benzonatate (TESSALON)  200 MG capsule Take 1 capsule (200 mg total) by mouth 3 (three) times daily as needed for cough. 12/26/20   Shirlee Latch, PA-C  Cholecalciferol (VITAMIN D3) 5000 UNITS CAPS Take 1 capsule (5,000 Units total) by mouth daily. 09/01/14   Plonk, Chrissie Noa, MD  Cyanocobalamin (VITAMIN B-12) 5000 MCG SUBL Place 15,000 mcg under the tongue daily.    [provider]  DULoxetine (CYMBALTA) 60 MG capsule TAKE 2 CAPSULES BY MOUTH DAILY 11/13/20   Reubin Milan, MD  fluticasone [facility]) 50 MCG/ACT nasal spray Place 2 sprays into both nostrils daily. 09/17/16   Lutricia Feil, PA-C  guaiFENesin (MUCINEX PO) Take by mouth as needed.    [provider]  Multiple Vitamins-Minerals (MULTIVITAMIN WOMEN 50+) TABS Take 1 tablet by mouth daily.    [provider]  rOPINIRole (REQUIP) 0.25 MG tablet Take 1 tablet (0.25 mg total) by mouth at bedtime. 11/21/20   Reubin Milan, MD  sodium chloride (OCEAN) 0.65 % SOLN nasal spray Place 1 spray into both nostrils as needed for congestion.    [provider]  traZODone (DESYREL) 100 MG tablet TAKE 2 TABLETS(200 MG) BY MOUTH AT BEDTIME 10/25/20   Reubin Milan, MD  valACYclovir (VALTREX) 1000 MG tablet Take 1 tablet (1,000 mg total) by mouth daily. 11/21/20   Reubin Milan, MD    Family History Family History  Problem Relation Age of Onset   Lymphoma Mother    Sarcoidosis Mother    CAD Father    Breast cancer Sister 36   Brain cancer Sister     Social History Social History   Tobacco Use   Smoking status: Never   Smokeless tobacco: Never  Vaping Use   Vaping Use: Never used  Substance Use Topics   Alcohol use: No    Alcohol/week: 0.0 standard drinks   Drug use: No     Allergies   Amoxicillin-pot clavulanate, Beef-derived products, Pegademase bovine, and Penicillins   Review of Systems Review of Systems  Constitutional:  Positive for diaphoresis and fever. Negative for activity change and appetite  change.  HENT:  Positive for congestion, ear discharge, postnasal drip, sinus pressure and sinus pain. Negative for ear pain, sore throat and trouble swallowing.   Eyes:  Negative for discharge.  Respiratory:  Positive for cough. Negative for chest tightness, shortness of breath and wheezing.   Musculoskeletal:  Negative for myalgias.  Skin:  Negative for rash.  Neurological:  Negative for headaches.  Hematological:  Negative for adenopathy.  Physical Exam Triage Vital Signs ED Triage Vitals  Enc Vitals Group     BP 01/12/21 0819 138/71     Pulse Rate 01/12/21 0819 89     Resp 01/12/21 0819 16     Temp 01/12/21 0819 98.4 F (36.9 C)     Temp Source 01/12/21 0819 Oral     SpO2 01/12/21 0819 98 %     Weight 01/12/21 0817 238 lb 15.7 oz (108.4 kg)     Height 01/12/21 0817 5\' 8"  (1.727 m)     Head Circumference --      Peak Flow --      Pain Score 01/12/21 0817 8     Pain Loc --      Pain Edu? --      Excl. in [place]? --    No data found.  Updated Vital Signs BP 138/71 (BP Location: Right Arm)   Pulse 89   Temp 98.4 F (36.9 C) (Oral)   Resp 16   Ht 5\' 8"  (1.727 m)   Wt 238 lb 15.7 oz (108.4 kg)   SpO2 98%   BMI 36.34 kg/m   Visual Acuity Right Eye Distance:   Left Eye Distance:   Bilateral Distance:    Right Eye Near:   Left Eye Near:    Bilateral Near:     Physical Exam Physical Exam Vitals signs and nursing note reviewed.  Constitutional:      General: She is not in acute distress.    Appearance: Normal appearance. She is not ill-appearing, toxic-appearing or diaphoretic.  HENT:     Head: Normocephalic.     Right Ear: Tympanic membrane, ear canal and external ear normal.     Left Ear: Tympanic membrane, ear canal and external ear normal.     Nose: with yellow mucous    Mouth/Throat: clear    Mouth: Mucous membranes are moist.  Eyes:     General: No scleral icterus.       Right eye: No discharge.        Left eye: No discharge.     Conjunctiva/sclera:  Conjunctivae normal.  Neck:     Musculoskeletal: Neck supple. No neck rigidity.  Cardiovascular:     Rate and Rhythm: Normal rate and regular rhythm.     Heart sounds: No murmur.  Pulmonary:     Effort: Pulmonary effort is normal.     Breath sounds: Normal breath sounds.   Musculoskeletal: Normal range of motion.  Lymphadenopathy:     Cervical: No cervical adenopathy.  Skin:    General: Skin is warm and clammy    Coloration: Skin is not jaundiced.     Findings: No rash.  Neurological:     Mental Status: She is alert and oriented to person, place, and time.     Gait: Gait normal.  Psychiatric:        Mood and Affect: Mood normal.        Behavior: Behavior normal.        Thought Content: Thought content normal.        Judgment: Judgment normal.    UC Treatments / Results  Labs (all labs ordered are listed, but only abnormal results are displayed) Labs Reviewed - No data to display  EKG   Radiology No results found.  Procedures Procedures (including critical care time)  Medications Ordered in UC Medications - No data to display  Initial Impression / Assessment and Plan / UC Course  I have reviewed  the triage vital signs and the nursing notes. Sinusitis I placed her on Levaquin as noted. FU prn.  Final Clinical Impressions(s) / UC Diagnoses   Final diagnoses:  None   Discharge Instructions   None    ED Prescriptions   None    PDMP not reviewed this encounter.   Shelby Mattocks, [place] 01/12/21 [phone]

## 2021-01-18 ENCOUNTER — Encounter
Admission: RE | Admit: 2021-01-18 | Discharge: 2021-01-18 | Disposition: A | Discharge status: Home / Self Care | Payer: Medicare Other | Source: Ambulatory Visit | Attending: Surgery | Admitting: Surgery

## 2021-01-18 ENCOUNTER — Other Ambulatory Visit: Payer: Self-pay

## 2021-01-18 DIAGNOSIS — J329 Chronic sinusitis, unspecified: Secondary | ICD-10-CM

## 2021-01-18 DIAGNOSIS — I2510 Atherosclerotic heart disease of native coronary artery without angina pectoris: Secondary | ICD-10-CM

## 2021-01-18 HISTORY — DX: Cerebral infarction, unspecified: I63.9

## 2021-01-18 HISTORY — DX: Prediabetes: R73.03

## 2021-01-18 HISTORY — DX: Restless legs syndrome: G25.81

## 2021-01-18 HISTORY — DX: Other cerebrovascular disease: I67.89

## 2021-01-18 HISTORY — DX: Unspecified asthma, uncomplicated: J45.909

## 2021-01-18 HISTORY — DX: Chronic sinusitis, unspecified: J32.9

## 2021-01-18 NOTE — Patient Instructions (Addendum)
Your procedure is scheduled on: 01/24/2021  Report to the Registration Desk on the 1st floor of the [facility]. To find out your arrival time, please call [phone] between 1PM - 3PM on: 01/23/2021  REMEMBER: Instructions that are not followed completely may result in serious medical risk, up to and including death; or upon the discretion of your surgeon and anesthesiologist your surgery may need to be rescheduled.  Do not eat food after midnight the night before surgery.  No gum chewing, lozengers or hard candies.  You may however, drink CLEAR liquids up to 2 hours before you are scheduled to arrive for your surgery. Do not drink anything within 2 hours of your scheduled arrival time.  Clear liquids include: - water  - apple juice without pulp - gatorade (not RED, PURPLE, OR BLUE) - black coffee or tea (Do NOT add milk or creamers to the coffee or tea) Do NOT drink anything that is not on this list.   In addition, your doctor has ordered for you to drink the provided  Ensure Pre-Surgery Clear Carbohydrate Drink  Drinking this carbohydrate drink up to two hours before surgery helps to reduce insulin resistance and improve patient outcomes. Please complete drinking 2 hours prior to scheduled arrival time.  TAKE THESE MEDICATIONS THE MORNING OF SURGERY WITH A SIP OF WATER: - atorvastatin (LIPITOR) 10 MG tablet - DULoxetine (CYMBALTA) 60 MG capsule -valACYclovir (VALTREX) 1000 MG tablet  Use albuterol (VENTOLIN HFA) 108 (90 Base) MCG/ACT inhaler on the day of surgery and bring to the hospital.  One week prior to surgery: Stop Anti-inflammatories (NSAIDS) such as Advil, Aleve, Ibuprofen, Motrin, Naproxen, Naprosyn and Aspirin based products such as Excedrin, Goodys Powder, BC Powder. You may however, continue to take Tylenol if needed for pain up until the day of surgery.  Stop ANY OVER THE COUNTER vitamins and supplements until after surgery. Including: Cholecalciferol (VITAMIN  D3) 5000 UNITS CAPS, Cyanocobalamin (VITAMIN B-12) 5000 MCG, Multiple Vitamins-Minerals (MULTIVITAMIN WOMEN 50+) TABS,   No Alcohol for 24 hours before or after surgery.  No Smoking including e-cigarettes for 24 hours prior to surgery.  No chewable tobacco products for at least 6 hours prior to surgery.  No nicotine patches on the day of surgery.  Do not use any "recreational" drugs for at least a week prior to your surgery.  Please be advised that the combination of cocaine and anesthesia may have negative outcomes, up to and including death. If you test positive for cocaine, your surgery will be cancelled.  On the morning of surgery brush your teeth with toothpaste and water, you may rinse your mouth with mouthwash if you wish. Do not swallow any toothpaste or mouthwash.  Use CHG Soap as directed on instruction sheet.  Do not wear jewelry, make-up, hairpins, clips or nail polish.  Do not wear lotions, powders, or perfumes.   Do not shave body from the neck down 48 hours prior to surgery just in case you cut yourself which could leave a site for infection.  Also, freshly shaved skin may become irritated if using the CHG soap.  Contact lenses, hearing aids and dentures may not be worn into surgery.  Do not bring valuables to the hospital. [facility] is not responsible for any missing/lost belongings or valuables.   Notify your doctor if there is any change in your medical condition (cold, fever, infection).  Wear comfortable clothing (specific to your surgery type) to the hospital.  After surgery, you can help  prevent lung complications by doing breathing exercises.  Take deep breaths and cough every 1-2 hours. Your doctor may order a device called an Incentive Spirometer to help you take deep breaths. When coughing or sneezing, hold a pillow firmly against your incision with both hands. This is called "splinting." Doing this helps protect your incision. It also decreases belly  discomfort.  If you are being discharged the day of surgery, you will not be allowed to drive home. You will need a responsible adult (18 years or older) to drive you home and stay with you that night.   If you are taking public transportation, you will need to have a responsible adult (18 years or older) with you. Please confirm with your physician that it is acceptable to use public transportation.   Please call the [place] [facility] at [phone] if you have any questions about these instructions.  Surgery Visitation Policy:  Patients undergoing a surgery or procedure may have one family member or support person with them as long as that person is not COVID-19 positive or experiencing its symptoms.  That person may remain in the waiting area during the procedure and may rotate out with other people.  Inpatient Visitation:    Visiting hours are 7 a.m. to 8 p.m. Up to two visitors ages 16+ are allowed at one time in a patient room. The visitors may rotate out with other people during the day. Visitors must check out when they leave, or other visitors will not be allowed. One designated support person may remain overnight. The visitor must pass COVID-19 screenings, use hand sanitizer when entering and exiting the patient's room and wear a mask at all times, including in the patient's room. Patients must also wear a mask when staff or their visitor are in the room. Masking is required regardless of vaccination status.

## 2021-01-19 NOTE — Progress Notes (Signed)
  Perioperative Services Pre-Admission/Anesthesia Testing   Date: 01/19/21 Name: CALIAH KOPKE MRN:   [id number]  Re: Consideration of preoperative prophylactic antibiotic change   Request sent to: Leron Croak, MD (routed and/or faxed via [facility])  Planned Surgical Procedure(s):    Case: [id number] Date/Time: 01/24/21 0815   Procedure: EXCISION OF GANGLION CYST INVOLVING THE FLEXOR TENDON SHEATH OF RIGHT LITTLE FINGER. (Right: Little Finger)   Anesthesia type: Choice   Pre-op diagnosis: Ganglion cyst of flexor tendon sheath of finger of right hand M67.441   Location: [facility] OR ROOM 02 / [facility]S [facility]   Surgeons: Christena Flake, MD   Clinical Notes:  Patient has a documented allergy to PCN  Advising that PCN has caused her to experience nausea and vomiting in the past.   Screened as appropriate for cephalosporin use during medication reconciliation No immediate angioedema, dysphagia, SOB, anaphylaxis symptoms. No severe rash involving mucous membranes or skin necrosis. No hospital admissions related to side effects of PCN/cephalosporin use.   Request:  As an evidence based approach to reducing the rate of incidence for post-operative SSI and the development of MDROs, could an agent with narrower coverage for preoperative prophylaxis in this patient's upcoming surgical course be considered?   Currently ordered preoperative prophylactic ABX: clindamycin.   Specifically requesting change to cephalosporin (CEFAZOLIN).   Please communicate decision with me and I will change the orders in Epic as per your direction.   Things to consider: Many patients report that they were "allergic" to PCN earlier in life, however this does not translate into a true lifelong allergy. Patients can lose sensitivity to specific IgE antibodies over time if PCN is avoided (Kleris & Lugar, 2019).  Up to 10% of the adult population and 15% of hospitalized patients report an allergy to PCN, however  clinical studies suggest that 90% of those reporting an allergy can tolerate PCN antibiotics (Kleris & Lugar, 2019).  Cross-sensitivity between PCN and cephalosporins has been documented as being as high as 10%, however this estimation included data believed to have been collected in a setting where there was contamination. Newer data suggests that the prevalence of cross-sensitivity between PCN and cephalosporins is actually estimated to be closer to 1% (Hermanides et al., 2018).   Patients labeled as PCN allergic, whether they are truly allergic or not, have been found to have inferior outcomes in terms of rates of serious infection, and these patients tend to have longer hospital stays [facility] & Lugar, 2019).  Treatment related secondary infections, such as Clostridioides difficile, have been linked to the improper use of broad spectrum antibiotics in patients improperly labeled as PCN allergic (Kleris & Lugar, 2019).  Anaphylaxis from cephalosporins is rare and the evidence suggests that there is no increased risk of an anaphylactic type reaction when cephalosporins are used in a PCN allergic patient (Pichichero, 2006).  Citations: Hermanides J, Lemkes BA, Prins Gwenyth Bender MW, Terreehorst I. Presumed ?-Lactam Allergy and Cross-reactivity in the Operating Theater: A Practical Approach. Anesthesiology. 2018 Aug;129(2):335-342. doi: 10.1097/ALN.0000000000002252. PMID: 61443154.  Kleris, R. S., & Lugar, P. L. (2019). Things We Do For No Reason: Failing to Question a Penicillin Allergy History. Journal of hospital medicine, 14(10), [phone]. Advance online publication. airportbarriers.com  Pichichero, M. E. (2006). Cephalosporins can be prescribed safely for penicillin-allergic patients. Journal of family medicine, 55(2), 106-112. Accessed: https://cdn.mdedge.com/files/s77fs-public/Document/September-2017/5502JFP_AppliedEvidence1.pdf   Quentin Mulling, MSN, APRN, FNP-C, CEN [facility]  Peri-operative Services Nurse Practitioner FAX: [phone] 01/19/21 12:20 PM

## 2021-01-19 NOTE — Progress Notes (Signed)
  Perioperative Services Pre-Admission/Anesthesia Testing     Date: 01/19/21  Name: Ann Peters MRN:   [id number]  Re: Change in ABX for upcoming surgery   Case: [id number] Date/Time: 01/24/21 0815   Procedure: EXCISION OF GANGLION CYST INVOLVING THE FLEXOR TENDON SHEATH OF RIGHT LITTLE FINGER. (Right: Little Finger)   Anesthesia type: Choice   Pre-op diagnosis: Ganglion cyst of flexor tendon sheath of finger of right hand M67.441   Location: [facility] OR ROOM 02 / [facility]   Surgeons: Christena Flake, MD   Primary attending surgeon was consulted regarding consideration of therapeutic change in antimicrobial agent being used for preoperative prophylaxis in this patient's upcoming surgical case. Following analysis of the risk versus benefits, the patient's primary attending surgeon advised that it would be acceptable to discontinue the ordered clindamycin and place an order for cefazolin 2 gm IV on call to the OR. Orders for this patient were amended by me following collaborative conversation with attending surgeon taking into consideration of risk versus benefits associated with the change in therapy.  Quentin Mulling, MSN, APRN, FNP-C, CEN [facility]  Peri-operative Services Nurse Practitioner Phone: [phone] 01/19/21 3:35 PM

## 2021-01-22 ENCOUNTER — Encounter
Admission: RE | Admit: 2021-01-22 | Discharge: 2021-01-22 | Disposition: A | Discharge status: Home / Self Care | Payer: Medicare Other | Source: Ambulatory Visit | Attending: Surgery | Admitting: Surgery

## 2021-01-22 ENCOUNTER — Other Ambulatory Visit: Payer: Self-pay

## 2021-01-22 DIAGNOSIS — Z01818 Encounter for other preprocedural examination: Secondary | ICD-10-CM | POA: Insufficient documentation

## 2021-01-22 DIAGNOSIS — I2510 Atherosclerotic heart disease of native coronary artery without angina pectoris: Secondary | ICD-10-CM

## 2021-01-22 DIAGNOSIS — I639 Cerebral infarction, unspecified: Secondary | ICD-10-CM | POA: Diagnosis not present

## 2021-01-22 LAB — CBC
HCT: 41.5 % (ref 36.0–46.0)
Hemoglobin: 14.1 g/dL (ref 12.0–15.0)
MCH: 31.4 pg (ref 26.0–34.0)
MCHC: 34 g/dL (ref 30.0–36.0)
MCV: 92.4 fL (ref 80.0–100.0)
Platelets: 328 10*3/uL (ref 150–400)
RBC: 4.49 MIL/uL (ref 3.87–5.11)
RDW: 13.5 % (ref 11.5–15.5)
WBC: 6.5 10*3/uL (ref 4.0–10.5)
nRBC: 0 % (ref 0.0–0.2)

## 2021-01-24 ENCOUNTER — Other Ambulatory Visit: Payer: Self-pay

## 2021-01-24 ENCOUNTER — Encounter: Payer: Self-pay | Admitting: Surgery

## 2021-01-24 ENCOUNTER — Ambulatory Visit
Admission: RE | Admit: 2021-01-24 | Discharge: 2021-01-24 | Disposition: A | Discharge status: Home / Self Care | Payer: Medicare Other | Attending: Surgery | Admitting: Surgery

## 2021-01-24 ENCOUNTER — Encounter
Admission: RE | Disposition: A | Discharge status: Home / Self Care | Payer: Self-pay | Source: Home / Self Care | Attending: Surgery

## 2021-01-24 ENCOUNTER — Ambulatory Visit: Payer: Medicare Other | Admitting: Urgent Care

## 2021-01-24 DIAGNOSIS — F419 Anxiety disorder, unspecified: Secondary | ICD-10-CM | POA: Diagnosis not present

## 2021-01-24 DIAGNOSIS — M67441 Ganglion, right hand: Secondary | ICD-10-CM | POA: Diagnosis not present

## 2021-01-24 DIAGNOSIS — I700 Atherosclerosis of aorta: Secondary | ICD-10-CM

## 2021-01-24 DIAGNOSIS — G2581 Restless legs syndrome: Secondary | ICD-10-CM

## 2021-01-24 DIAGNOSIS — I6789 Other cerebrovascular disease: Secondary | ICD-10-CM

## 2021-01-24 HISTORY — PX: GANGLION CYST EXCISION: SHX1691

## 2021-01-24 SURGERY — EXCISION, GANGLION CYST, WRIST
Anesthesia: General | Site: Little Finger | Laterality: Right

## 2021-01-24 MED ORDER — LIDOCAINE HCL (CARDIAC) PF 100 MG/5ML IV SOSY
PREFILLED_SYRINGE | INTRAVENOUS | Status: DC | PRN
  Administered 2021-01-24: 100 mg via INTRAVENOUS

## 2021-01-24 MED ORDER — ATORVASTATIN CALCIUM 10 MG PO TABS: 10.0000 mg | ORAL_TABLET | Freq: Every morning | ORAL | Status: DC

## 2021-01-24 MED ORDER — FENTANYL CITRATE (PF) 100 MCG/2ML IJ SOLN
INTRAMUSCULAR | Status: AC
  Filled 2021-01-24: qty 2

## 2021-01-24 MED ORDER — CEFAZOLIN SODIUM-DEXTROSE 2-4 GM/100ML-% IV SOLN
2.0000 g | Freq: Once | INTRAVENOUS | Status: AC
  Administered 2021-01-24: 2 g via INTRAVENOUS

## 2021-01-24 MED ORDER — BUPIVACAINE HCL (PF) 0.5 % IJ SOLN
INTRAMUSCULAR | Status: AC
  Filled 2021-01-24: qty 30

## 2021-01-24 MED ORDER — FENTANYL CITRATE (PF) 100 MCG/2ML IJ SOLN
INTRAMUSCULAR | Status: DC | PRN
  Administered 2021-01-24: 50 ug via INTRAVENOUS

## 2021-01-24 MED ORDER — ONDANSETRON HCL 4 MG/2ML IJ SOLN
INTRAMUSCULAR | Status: DC | PRN
  Administered 2021-01-24: 4 mg via INTRAVENOUS

## 2021-01-24 MED ORDER — ACETAMINOPHEN 10 MG/ML IV SOLN
INTRAVENOUS | Status: DC | PRN
  Administered 2021-01-24: 1000 mg via INTRAVENOUS

## 2021-01-24 MED ORDER — CEFAZOLIN SODIUM-DEXTROSE 2-4 GM/100ML-% IV SOLN
INTRAVENOUS | Status: AC
  Filled 2021-01-24: qty 100

## 2021-01-24 MED ORDER — LIDOCAINE HCL (PF) 2 % IJ SOLN
INTRAMUSCULAR | Status: AC
  Filled 2021-01-24: qty 5

## 2021-01-24 MED ORDER — MIDAZOLAM HCL 2 MG/2ML IJ SOLN
INTRAMUSCULAR | Status: DC | PRN
  Administered 2021-01-24: 2 mg via INTRAVENOUS

## 2021-01-24 MED ORDER — DEXAMETHASONE SODIUM PHOSPHATE 10 MG/ML IJ SOLN
INTRAMUSCULAR | Status: DC | PRN
  Administered 2021-01-24: 10 mg via INTRAVENOUS

## 2021-01-24 MED ORDER — FAMOTIDINE 20 MG PO TABS: 20.0000 mg | ORAL_TABLET | Freq: Once | ORAL | Status: AC

## 2021-01-24 MED ORDER — ACETAMINOPHEN 10 MG/ML IV SOLN
INTRAVENOUS | Status: AC
  Filled 2021-01-24: qty 100

## 2021-01-24 MED ORDER — LACTATED RINGERS IV SOLN: INTRAVENOUS | Status: DC

## 2021-01-24 MED ORDER — ROPINIROLE HCL 0.25 MG PO TABS: 0.2500 mg | ORAL_TABLET | Freq: Every evening | ORAL | Status: AC | PRN

## 2021-01-24 MED ORDER — FENTANYL CITRATE (PF) 100 MCG/2ML IJ SOLN: 25.0000 ug | INTRAMUSCULAR | Status: DC | PRN

## 2021-01-24 MED ORDER — FLUTICASONE PROPIONATE 50 MCG/ACT NA SUSP: 2.0000 | Freq: Every day | NASAL | Status: AC | PRN

## 2021-01-24 MED ORDER — LIDOCAINE HCL (PF) 1 % IJ SOLN
INTRAMUSCULAR | Status: AC
  Filled 2021-01-24: qty 30

## 2021-01-24 MED ORDER — PROPOFOL 10 MG/ML IV BOLUS
INTRAVENOUS | Status: DC | PRN
  Administered 2021-01-24: 15 mg via INTRAVENOUS

## 2021-01-24 MED ORDER — BUPIVACAINE HCL (PF) 0.5 % IJ SOLN
INTRAMUSCULAR | Status: DC | PRN
  Administered 2021-01-24: 5 mL

## 2021-01-24 MED ORDER — ORAL CARE MOUTH RINSE
15.0000 mL | Freq: Once | OROMUCOSAL | Status: AC
  Administered 2021-01-24: 15 mL via OROMUCOSAL

## 2021-01-24 MED ORDER — ONDANSETRON HCL 4 MG/2ML IJ SOLN: 4.0000 mg | Freq: Once | INTRAMUSCULAR | Status: DC | PRN

## 2021-01-24 MED ORDER — FAMOTIDINE 20 MG PO TABS
ORAL_TABLET | ORAL | Status: AC
  Administered 2021-01-24: 20 mg via ORAL
  Filled 2021-01-24: qty 1

## 2021-01-24 MED ORDER — PHENYLEPHRINE HCL (PRESSORS) 10 MG/ML IV SOLN
INTRAVENOUS | Status: DC | PRN
  Administered 2021-01-24 (×2): 100 ug via INTRAVENOUS

## 2021-01-24 MED ORDER — PROPOFOL 1000 MG/100ML IV EMUL
INTRAVENOUS | Status: AC
  Filled 2021-01-24: qty 100

## 2021-01-24 MED ORDER — ONDANSETRON HCL 4 MG/2ML IJ SOLN
INTRAMUSCULAR | Status: AC
  Filled 2021-01-24: qty 2

## 2021-01-24 MED ORDER — DEXAMETHASONE SODIUM PHOSPHATE 10 MG/ML IJ SOLN
INTRAMUSCULAR | Status: AC
  Filled 2021-01-24: qty 1

## 2021-01-24 MED ORDER — MIDAZOLAM HCL 2 MG/2ML IJ SOLN
INTRAMUSCULAR | Status: AC
  Filled 2021-01-24: qty 2

## 2021-01-24 MED ORDER — 0.9 % SODIUM CHLORIDE (POUR BTL) OPTIME
TOPICAL | Status: DC | PRN
  Administered 2021-01-24: 500 mL

## 2021-01-24 SURGICAL SUPPLY — 25 items
BNDG COHESIVE 4X5 TAN ST LF (GAUZE/BANDAGES/DRESSINGS) ×2 IMPLANT
BNDG ELASTIC 2X5.8 VLCR STR LF (GAUZE/BANDAGES/DRESSINGS) ×2 IMPLANT
BNDG ESMARK 4X12 TAN STRL LF (GAUZE/BANDAGES/DRESSINGS) ×2 IMPLANT
CHLORAPREP W/TINT 26 (MISCELLANEOUS) ×4 IMPLANT
CORD BIP STRL DISP 12FT (MISCELLANEOUS) ×2 IMPLANT
CUFF TOURN SGL QUICK 18X4 (TOURNIQUET CUFF) IMPLANT
FORCEPS JEWEL BIP 4-3/4 STR (INSTRUMENTS) ×2 IMPLANT
GAUZE 4X4 16PLY ~~LOC~~+RFID DBL (SPONGE) ×2 IMPLANT
GAUZE SPONGE 4X4 12PLY STRL (GAUZE/BANDAGES/DRESSINGS) ×2 IMPLANT
GAUZE XEROFORM 1X8 LF (GAUZE/BANDAGES/DRESSINGS) ×2 IMPLANT
GLOVE SURG ENC MOIS LTX SZ8 (GLOVE) ×2 IMPLANT
GLOVE SURG UNDER LTX SZ8 (GLOVE) ×2 IMPLANT
GOWN STRL REUS W/ TWL XL LVL3 (GOWN DISPOSABLE) ×1 IMPLANT
GOWN STRL REUS W/TWL XL LVL3 (GOWN DISPOSABLE) ×1
KIT TURNOVER KIT A (KITS) ×2 IMPLANT
MANIFOLD NEPTUNE II (INSTRUMENTS) ×2 IMPLANT
NS IRRIG 500ML POUR BTL (IV SOLUTION) ×2 IMPLANT
PACK EXTREMITY ARMC (MISCELLANEOUS) ×2 IMPLANT
SPLINT WRIST LG LT TX990309 (SOFTGOODS) IMPLANT
SPLINT WRIST LG RT TX900304 (SOFTGOODS) IMPLANT
SPLINT WRIST M LT TX990308 (SOFTGOODS) IMPLANT
SPLINT WRIST M RT TX990303 (SOFTGOODS) IMPLANT
STOCKINETTE IMPERVIOUS 9X36 MD (GAUZE/BANDAGES/DRESSINGS) ×2 IMPLANT
SUT PROLENE 4 0 PS 2 18 (SUTURE) ×2 IMPLANT
WATER STERILE IRR 500ML POUR (IV SOLUTION) ×2 IMPLANT

## 2021-01-24 NOTE — H&P (Signed)
History of Present Illness:  Ann Peters is a 70 y.o. female who presents for a history and physical for upcoming right hand ganglion cyst excision to be done by Dr. Roland Rack on January 24, 2021. Patient has been seen by Dr. Roland Rack in the past for evaluation and treatment of a painful soft tissue mass on the volar aspect of the right hand. Her symptoms have been present for several years but have gradually worsened more recently, prompting her to make this appointment. She notes that she had a similar issue in her left hand which was treated by Dr. Leanor Kail with good results. She localizes the pain to the ulnar side of the palm of her right hand. She denies any radiation of her pain either proximally or distally, and denies any numbness or paresthesias to her fingertips. She has been taking Tylenol and/or ibuprofen as necessary with temporary partial relief. She also has been applying Aspercreme, also with limited benefit. She saw Reche Dixon, PA-C, who referred her to me for possible surgical intervention.  Current Outpatient Medications:  atorvastatin (LIPITOR) 10 MG tablet   cholecalciferol (VITAMIN D3) 5,000 unit capsule Take 1 capsule by mouth once daily   cyanocobalamin (VITAMIN B12) 1000 MCG tablet Take 1,000 mcg by mouth once daily   traZODone (DESYREL) 50 MG tablet Take 1 tablet by mouth nightly as needed for Sleep   valACYclovir (VALTREX) 1000 MG tablet Take by mouth   azelastine (OPTIVAR) 0.05 % ophthalmic solution Place 1 drop into both eyes 2 (two) times daily (Patient not taking: Reported on 01/22/2021)   DULoxetine (CYMBALTA) 60 MG DR capsule Take 1 capsule (60 mg total) by mouth daily. 90 capsule 3   fluticasone (FLONASE) 50 mcg/actuation nasal spray Place 2 sprays into both nostrils daily. 48 g 3   levoFLOXacin (LEVAQUIN) 750 MG tablet Take by mouth (Patient not taking: Reported on 01/22/2021)   rOPINIRole (REQUIP) 0.25 MG tablet (Patient not taking: Reported on 01/22/2021)    Allergies:   Amoxicillin-Pot Clavulanate Nausea And Vomiting   Pegademase Bovine Nausea And Vomiting   Penicillins Nausea And Vomiting  Past Medical History:   Anxiety   Depression   Diverticulitis   Dupuytren contracture left hand   Hypertension   Insomnia 11/05/2016   Onychomycosis of left great toe 05/10/2015   Osteoarthritis of left hip 10/10/2015   Prediabetes 01/03/2015   Recurrent major depression resistant to treatment (CMS-HCC) 11/15/2013   Sinusitis, unspecified   Vitamin B12 deficiency 12/23/2011   Vitamin D deficiency 09/01/2014   Past Surgical History:   BLADDER SUSPENSION   colectomy partial   dupuytren release Left 03/28/2017 (middle finger)   HYSTERECTOMY   JOINT REPLACEMENT Left Hip   SINUS SURGERY X3   Family History:  No pertinent family history.   Social History:   Socioeconomic History:   Marital status: Married  Spouse name: Brystol Gailey   Number of children: 3  Occupational History   Occupation: Geologist, engineering  Tobacco Use   Smoking status: Never   Smokeless tobacco: Never  Vaping Use   Vaping Use: Never used  Substance and Sexual Activity   Alcohol use: No   Drug use: No   Sexual activity: Yes  Partners: Male  Birth control/protection: Surgical   Review of Systems:  A comprehensive 14 point ROS was performed, reviewed, and the pertinent orthopaedic findings are documented in the HPI.  Physical Exam: Vitals:  01/22/21 1428  BP: 133/81  Pulse: 89  Weight: (!) 104.6 kg (230 lb  9.6 oz)  Height: 170.2 cm (5\' 7" )  PainSc: 0-No pain  PainLoc: Hand   General/Constitutional: The patient appears to be well-nourished, well-developed, and in no acute distress. Neuro/Psych: Normal mood and affect, oriented to person, place and time. Eyes: Non-icteric. Pupils are equal, round, and reactive to light, and exhibit synchronous movement. ENT: Unremarkable. Lymphatic: No palpable adenopathy. Respiratory: Lungs clear to auscultation, Normal  chest excursion, No wheezes and Non-labored breathing Cardiovascular: Regular rate and rhythm. No murmurs. and No edema, swelling or tenderness, except as noted in detailed exam. Integumentary: No impressive skin lesions present, except as noted in detailed exam. Musculoskeletal: Unremarkable, except as noted in detailed exam.  Right hand exam: Skin inspection of the right hand is notable for a firm fluid-filled cyst over the volar aspect of the little MCP joint which measures approximately 6 mm in diameter. It is mildly tender to firm palpation. There are no other areas of tenderness over the hand or wrist. No swelling, erythema, ecchymosis, or other skin abnormalities are identified. She is able actively flex and extend all digits fully without any pain or triggering. She is neurovascularly intact to all digits.  Assessment:  Ganglion cyst of flexor tendon sheath of finger of right hand.   Plan: The treatment options were discussed with the patient. In addition, patient educational materials were provided regarding the diagnosis and treatment options. The patient is frustrated by her persistent symptoms and functional limitations, and is ready to consider more aggressive treatment options. Therefore, I have recommended a surgical procedure, specifically an excision of the ganglion cyst which appears to be emanating from the flexor sheath of her right little finger. The procedure was discussed with the patient, as were the potential risks (including bleeding, infection, nerve and/or blood vessel injury, persistent or recurrent pain, recurrence of the cyst, stiffness of the finger, weakness of grip, need for further surgery, blood clots, strokes, heart attacks and/or arhythmias, pneumonia, etc.) and benefits. The patient states her understanding and wishes to proceed. All of the patient's questions and concerns were answered. She can call any time with further concerns. She will follow up post-surgery,  routine.    H&P reviewed and patient re-examined. No changes.

## 2021-01-24 NOTE — Anesthesia Procedure Notes (Signed)
Procedure Name: LMA Insertion Date/Time: 01/24/2021 8:29 AM Performed by: Irving Burton, CRNA Pre-anesthesia Checklist: Patient identified, Patient being monitored, Timeout performed, Emergency Drugs available and Suction available Patient Re-evaluated:Patient Re-evaluated prior to induction Oxygen Delivery Method: Circle system utilized Preoxygenation: Pre-oxygenation with 100% oxygen Induction Type: IV induction Ventilation: Mask ventilation without difficulty LMA: LMA inserted LMA Size: 4.0 Tube type: Oral Number of attempts: 1 Placement Confirmation: positive ETCO2 and breath sounds checked- equal and bilateral Tube secured with: Tape Dental Injury: Teeth and Oropharynx as per pre-operative assessment

## 2021-01-24 NOTE — Anesthesia Postprocedure Evaluation (Signed)
Anesthesia Post Note  Patient: Ann Peters  Procedure(s) Performed: EXCISION OF GANGLION CYST INVOLVING THE FLEXOR TENDON SHEATH OF RIGHT LITTLE FINGER. (Right: Little Finger)  Patient location during evaluation: PACU Anesthesia Type: General Level of consciousness: awake and alert Pain management: pain level controlled Vital Signs Assessment: post-procedure vital signs reviewed and stable Respiratory status: spontaneous breathing, nonlabored ventilation, respiratory function stable and patient connected to nasal cannula oxygen Cardiovascular status: blood pressure returned to baseline and stable Postop Assessment: no apparent nausea or vomiting Anesthetic complications: no   No notable events documented.   Last Vitals:  Vitals:   01/24/21 0930 01/24/21 0953  BP: 108/76 118/71  Pulse: 72 75  Resp: 17 16  Temp:  (!) 36.4 C  SpO2: 91% 99%    Last Pain:  Vitals:   01/24/21 0953  TempSrc: Temporal  PainSc: 0-No pain                 Yevette Edwards

## 2021-01-24 NOTE — Transfer of Care (Signed)
Immediate Anesthesia Transfer of Care Note  Patient: Ann Peters  Procedure(s) Performed: EXCISION OF GANGLION CYST INVOLVING THE FLEXOR TENDON SHEATH OF RIGHT LITTLE FINGER. (Right: Little Finger)  Patient Location: PACU  Anesthesia Type:General  Level of Consciousness: awake  Airway & Oxygen Therapy: Patient Spontanous Breathing and Patient connected to nasal cannula oxygen  Post-op Assessment: Report given to RN and Post -op Vital signs reviewed and stable  Post vital signs: stable  Last Vitals:  Vitals Value Taken Time  BP 137/73 01/24/21 0910  Temp    Pulse 76 01/24/21 0912  Resp 22 01/24/21 0912  SpO2 94 % 01/24/21 0912  Vitals shown include unvalidated device data.  Last Pain:  Vitals:   01/24/21 0728  TempSrc: Oral  PainSc: 0-No pain         Complications: No notable events documented.

## 2021-01-24 NOTE — Anesthesia Preprocedure Evaluation (Signed)
Anesthesia Evaluation  Patient identified by MRN, date of birth, ID band Patient awake    Reviewed: Allergy & Precautions, H&P , NPO status , Patient's Chart, lab work & pertinent test results, reviewed documented beta blocker date and time   Airway Mallampati: II  TM Distance: >3 FB Neck ROM: full    Dental  (+) Teeth Intact   Pulmonary asthma , pneumonia,    Pulmonary exam normal        Cardiovascular Exercise Tolerance: Good negative cardio ROS Normal cardiovascular exam Rate:Normal     Neuro/Psych PSYCHIATRIC DISORDERS Anxiety Depression  Neuromuscular disease CVA, No Residual Symptoms    GI/Hepatic negative GI ROS, Neg liver ROS,   Endo/Other  negative endocrine ROS  Renal/GU negative Renal ROS  negative genitourinary   Musculoskeletal   Abdominal   Peds  Hematology  (+) Blood dyscrasia, anemia ,   Anesthesia Other Findings   Reproductive/Obstetrics negative OB ROS                             Anesthesia Physical Anesthesia Plan  ASA: 3  Anesthesia Plan: General LMA   Post-op Pain Management:    Induction:   PONV Risk Score and Plan:   Airway Management Planned:   Additional Equipment:   Intra-op Plan:   Post-operative Plan:   Informed Consent: I have reviewed the patients History and Physical, chart, labs and discussed the procedure including the risks, benefits and alternatives for the proposed anesthesia with the patient or authorized representative who has indicated his/her understanding and acceptance.       Plan Discussed with: CRNA  Anesthesia Plan Comments:         Anesthesia Quick Evaluation

## 2021-01-24 NOTE — Op Note (Addendum)
01/24/2021  9:10 AM  Patient:   Ann Peters  Pre-Op Diagnosis:   Ganglion cyst of flexor tendon sheath of finger of right hand.  Post-Op Diagnosis:   Soft tissue mass, consistent with Dupuytren's nodule.  Procedure:   Excision of soft tissue mass, right hand.  Surgeon:   Maryagnes Amos, MD  Assistant:   None  Anesthesia:   General LMA  Findings:   As above.  The mass was fibrous in consistency and measured approximately 1 x 1.2 cm.  Complications:   None  Fluids:   300 cc crystalloid  EBL:   0 cc  UOP:   None  TT:   18 minutes at 250 mmHg  Drains:   None  Closure:   4-0 Prolene interrupted sutures  Brief Clinical Note:   The patient is a 70 year old female with a history of a slowly enlarging soft tissue mass over the volar aspect of her right little MCP joint.  The mass has been present for several years, but recently has become uncomfortable.  She presents at this time for excision of the soft tissue mass.  Procedure:   The patient was brought into the operating room and laid in a supine position.  After adequate general laryngeal mask anesthesia was obtained, the patient right hand and upper extremity were prepped with ChloraPrep solution before being draped sterilely.  Preoperative antibiotics were administered.  A timeout was performed to verify the appropriate surgical site before the limb was exsanguinated with an Esmarch and the tourniquet inflated to 250 mmHg.  An approximately 2 cm was made obliquely over the mass.  This incision was carried through subcutaneous tissues where the mass was immediately encountered.  Rather than being cystlike as expected, it was a solid fibrous-like soft tissue mass, most consistent with a Dupuytren's nodule.  The mass was dissected out circumferentially and removed, then sent to Pathology for definitive identification.  The wound was copiously irrigated with sterile saline solution before being closed using 4-0 Prolene interrupted  sutures.  A sterile bulky dressing was applied to the hand before the patient was awakened, extubated, and returned to the recovery room after tolerating the procedure well.

## 2021-01-24 NOTE — Discharge Instructions (Addendum)
Orthopedic discharge instructions: Keep dressing dry and intact. Keep hand elevated above heart level. May shower after dressing removed on postop day 4 (Sunday). Cover sutures with Band-Aids after drying off. Apply ice to affected area frequently. Take ibuprofen 600-800 mg TID with meals for 5-7 days, then as necessary. Take ES Tylenol as needed.  Return for follow-up in 10-14 days or as scheduled.   AMBULATORY SURGERY  DISCHARGE INSTRUCTIONS   The drugs that you were given will stay in your system until tomorrow so for the next 24 hours you should not:  Drive an automobile Make any legal decisions Drink any alcoholic beverage   You may resume regular meals tomorrow.  Today it is better to start with liquids and gradually work up to solid foods.  You may eat anything you prefer, but it is better to start with liquids, then soup and crackers, and gradually work up to solid foods.   Please notify your doctor immediately if you have any unusual bleeding, trouble breathing, redness and pain at the surgery site, drainage, fever, or pain not relieved by medication.    Additional Instructions:        Please contact your physician with any problems or Same Day Surgery at [phone], Monday through Friday 6 am to 4 pm, or [place] at [facility] number at [phone].

## 2021-01-25 ENCOUNTER — Encounter: Payer: Self-pay | Admitting: Surgery

## 2021-01-25 LAB — SURGICAL PATHOLOGY

## 2021-01-26 ENCOUNTER — Other Ambulatory Visit: Payer: Self-pay | Admitting: Orthopaedic Surgery

## 2021-01-26 DIAGNOSIS — G8929 Other chronic pain: Secondary | ICD-10-CM

## 2021-01-26 DIAGNOSIS — M25561 Pain in right knee: Secondary | ICD-10-CM

## 2021-02-01 ENCOUNTER — Other Ambulatory Visit: Payer: Self-pay

## 2021-02-01 ENCOUNTER — Encounter: Payer: Self-pay | Admitting: Pulmonary Disease

## 2021-02-01 ENCOUNTER — Ambulatory Visit: Payer: Medicare Other | Admitting: Pulmonary Disease

## 2021-02-01 VITALS — BP 130/82 | HR 84 | Temp 97.1°F | Ht 68.0 in | Wt 233.0 lb

## 2021-02-01 DIAGNOSIS — J4540 Moderate persistent asthma, uncomplicated: Secondary | ICD-10-CM

## 2021-02-01 DIAGNOSIS — I319 Disease of pericardium, unspecified: Secondary | ICD-10-CM

## 2021-02-01 DIAGNOSIS — R0602 Shortness of breath: Secondary | ICD-10-CM

## 2021-02-01 MED ORDER — ALBUTEROL SULFATE HFA 108 (90 BASE) MCG/ACT IN AERS: 2.0000 | INHALATION_SPRAY | Freq: Four times a day (QID) | RESPIRATORY_TRACT | 2 refills | Status: AC | PRN

## 2021-02-01 NOTE — Progress Notes (Signed)
Subjective:    Patient ID: Ann Peters, female    DOB: May 01, 1950, 70 y.o.   MRN: [id number] Chief Complaint  Patient presents with   Follow-up    HPI Ann Peters is a 70 year old lifelong never smoker who follows here for the issue of dyspnea and pleural effusion noted in July 2021. This is a scheduled visit. The patient was last seen on 27 November 2020 at that time she had no evidence of pleural effusion having recurred.  PFTs were ordered results of which are as below.  The PFTs were consistent with asthma. Recall that she had a pleuropericarditis in July 2021 with extensive work-up that yielded no exact etiology. This resolved with colchicine and prednisone. Since that time the patient has done well however she noted symptoms in the earlier part of September. Chest x-ray at that time did not show evidence of recurrence of effusion. She responded to prednisone, antibiotics and because of mild bronchospasm she was placed on Air Duo inhaler. She was provided this is a sample. The patient states that using that inhaler really helped her a lot and that she felt her dyspnea was relieved by it.  However, she never got a prescription filled for the medication due to cost and is now reluctant to use an inhaler.  We discussed the pathophysiology of asthma and that she would do better with a maintenance medication.  She has not had any fevers, chills or sweats. She now feels that she is back to baseline. She has no other complaints or concerns today.   Overall he feels well and looks well.   As noted she had pulmonary function testing performed 28 November 2020: FEV1 was 2.09 L or 79% predicted, FVC was 2.84 L or 82% predicted, FEV1/FVC 74%, she had significant bronchodilator response.  Lung volumes were normal.  Fusion capacity normal.  PFTs were consistent with mild to moderate obstructive airways disease with significant bronchodilator response i.e. asthmatic type.   Review of Systems A 10 point  review of systems was performed and it is as noted above otherwise negative.  Patient Active Problem List   Diagnosis Date Noted   Restless legs syndrome (RLS) 11/21/2020   Keratosis 09/18/2020   Carpal tunnel syndrome on right 03/20/2020   Cerebral microvascular disease 11/10/2019   Pleural effusion, left 10/19/2019   Aortic atherosclerosis (HCC) 10/08/2019   Personal history of colonic polyps    Insomnia 11/05/2016   Osteoarthritis of left hip 10/10/2015   Status post left hip replacement 10/10/2015   Prediabetes 01/03/2015   Anxiety 09/01/2014   Vitamin D deficiency 09/01/2014   Herpes zoster 09/01/2014   Fibromyalgia 11/15/2013   Recurrent major depressive disorder, in partial remission (HCC) 11/15/2013   Vitamin B12 deficiency 12/23/2011   Allergic rhinitis 04/18/2011   Social History   Tobacco Use   Smoking status: Never   Smokeless tobacco: Never  Substance Use Topics   Alcohol use: No    Alcohol/week: 0.0 standard drinks   Allergies  Allergen Reactions   Amoxicillin-Pot Clavulanate Nausea And Vomiting   Beef-Derived Products Nausea And Vomiting   Pegademase Bovine Nausea And Vomiting   Penicillins Nausea And Vomiting and Other (See Comments)    Has patient had a PCN reaction causing immediate rash, facial/tongue/throat swelling, SOB or lightheadedness with hypotension: no Has patient had a PCN reaction causing severe rash involving mucus membranes or skin necrosis: No Has patient had a PCN reaction that required hospitalization No Has patient had a PCN  reaction occurring within the last 10 years: Yes If all of the above answers are "NO", then may proceed with Cephalosporin use.        Current Meds  Medication Sig   albuterol (VENTOLIN HFA) 108 (90 Base) MCG/ACT inhaler Inhale 2 puffs into the lungs every 6 (six) hours as needed for wheezing or shortness of breath.   atorvastatin (LIPITOR) 10 MG tablet Take 1 tablet (10 mg total) by mouth in the morning.    benzonatate (TESSALON) 200 MG capsule Take 1 capsule (200 mg total) by mouth 3 (three) times daily as needed for cough.   Cholecalciferol (VITAMIN D3) 5000 UNITS CAPS Take 1 capsule (5,000 Units total) by mouth daily.   Cyanocobalamin (VITAMIN B-12) 5000 MCG SUBL Place 15,000 mcg under the tongue daily.   DULoxetine (CYMBALTA) 60 MG capsule TAKE 2 CAPSULES BY MOUTH DAILY   fluticasone (FLONASE) 50 MCG/ACT nasal spray Place 2 sprays into both nostrils daily as needed for allergies.   Multiple Vitamins-Minerals (MULTIVITAMIN WOMEN 50+) TABS Take 1 tablet by mouth daily.   rOPINIRole (REQUIP) 0.25 MG tablet Take 1 tablet (0.25 mg total) by mouth at bedtime as needed (restless legs).   sodium chloride (OCEAN) 0.65 % SOLN nasal spray Place 1 spray into both nostrils as needed for congestion.   traZODone (DESYREL) 100 MG tablet TAKE 2 TABLETS(200 MG) BY MOUTH AT BEDTIME   valACYclovir (VALTREX) 1000 MG tablet Take 1 tablet (1,000 mg total) by mouth daily.   Immunization History  Administered Date(s) Administered   Fluad Quad(high Dose 65+) 11/17/2018, 11/10/2019, 11/21/2020   Influenza, High Dose Seasonal PF 11/05/2016, 02/04/2018   Influenza,inj,Quad PF,6+ Mos 01/03/2015, 01/22/2016   PFIZER(Purple Top)SARS-COV-2 Vaccination 03/23/2019, 05/03/2019, 11/16/2019   Pneumococcal Conjugate-13 10/03/2014   Pneumococcal Polysaccharide-23 05/08/2015   Tdap 10/03/2014   Zoster, Live 05/30/2011        Objective:   Physical Exam BP 130/82 (BP Location: Left Arm, Patient Position: Sitting, Cuff Size: Normal)   Pulse 84   Temp (!) 97.1 F (36.2 C) (Oral)   Ht 5\' 8"  (1.727 m)   Wt 233 lb (105.7 kg)   BMI 35.43 kg/m   O2 Device: None (Room air)  GENERAL: Obese woman, alert, no acute distress.  No tachypnea noted. HEAD: Normocephalic, atraumatic.  EYES: Pupils equal, round, reactive to light.  No scleral icterus.  MOUTH: Nose/mouth/throat not examined due to masking requirements for COVID  19. NECK: Supple. No thyromegaly. Trachea midline. No JVD.  No adenopathy. PULMONARY: Good air entry bilaterally, symmetrical.  No adventitious sounds, clear lungs. CARDIOVASCULAR: S1 and S2. Regular rate and rhythm.  No rubs, murmurs or gallops heard. GASTROINTESTINAL: Abdomen is obese otherwise unremarkable. MUSCULOSKELETAL: No joint deformity, no clubbing, no edema.   NEUROLOGIC: No focal deficit noted.  Fluent speech.  No gait disturbance noted. SKIN: Intact,warm,dry.  Limited exam no rashes. PSYCH: Mood and behavior are normal.     Assessment & Plan:     ICD-10-CM   1. Moderate persistent asthma without complication  J45.40    Recommend maintenance inhaler States she feels "fine" not feel she needed I counseled with regards to pathophysiology of asthma    2. Shortness of breath  R06.02    Notes somewhat better Likely related to asthma Recommend she consider maintenance inhaler Continue albuterol as needed at the very least    3. Pleuropericarditis - resolved  I31.9    Clinically no evidence of recurrence Etiology was likely viral     Meds ordered  this encounter  Medications   albuterol (VENTOLIN HFA) 108 (90 Base) MCG/ACT inhaler    Sig: Inhale 2 puffs into the lungs every 6 (six) hours as needed for wheezing or shortness of breath.    Dispense:  8 g    Refill:  2   We will see her in follow-up in 6 months time call sooner should any new problems arise.  Gailen Shelter, MD Advanced Bronchoscopy PCCM [facility]    *This note was dictated using voice recognition software/Dragon.  Despite best efforts to proofread, errors can occur which can change the meaning. Any transcriptional errors that result from this process are unintentional and may not be fully corrected at the time of dictation.

## 2021-02-01 NOTE — Patient Instructions (Addendum)
Consider a maintenance inhaler.  You may want to research which inhalers for asthma your insurance has the best coverage for.  Then let us know so we can recommend one for you.  We have written a prescription for albuterol that has been printed so that you can shop for the best price with [facility].  We will see him in follow-up in 6 months time call sooner should any new problems arise.

## 2021-02-06 ENCOUNTER — Other Ambulatory Visit: Payer: Self-pay

## 2021-02-06 ENCOUNTER — Encounter: Payer: Self-pay | Admitting: Internal Medicine

## 2021-02-06 ENCOUNTER — Ambulatory Visit (INDEPENDENT_AMBULATORY_CARE_PROVIDER_SITE_OTHER): Payer: Medicare Other | Admitting: Internal Medicine

## 2021-02-06 VITALS — BP 112/82 | HR 98 | Temp 97.8°F | Ht 68.0 in | Wt 233.4 lb

## 2021-02-06 DIAGNOSIS — J0100 Acute maxillary sinusitis, unspecified: Secondary | ICD-10-CM

## 2021-02-06 DIAGNOSIS — I2510 Atherosclerotic heart disease of native coronary artery without angina pectoris: Secondary | ICD-10-CM | POA: Diagnosis not present

## 2021-02-06 MED ORDER — CEFDINIR 300 MG PO CAPS
300.0000 mg | ORAL_CAPSULE | Freq: Two times a day (BID) | ORAL | 0 refills | Status: DC
Start: 2021-02-06 — End: 2021-07-07

## 2021-02-06 NOTE — Patient Instructions (Signed)
Coricidin HBP - decongestant over the counter - take as directed.

## 2021-02-06 NOTE — Progress Notes (Signed)
Date:  02/06/2021   Name:  Ann Peters   DOB:  Sep 12, 1950   MRN:  [id number]   Chief Complaint: Cough  Sinus Problem This is a recurrent problem. The problem has been gradually worsening since onset. There has been no fever. Associated symptoms include congestion, coughing, ear pain, headaches, shortness of breath and sinus pressure. Pertinent negatives include no chills. Past treatments include antibiotics and spray decongestants. The treatment provided moderate relief.   Lab Results  Component Value Date   NA 145 (H) 11/21/2020   K 4.3 11/21/2020   CO2 21 11/21/2020   GLUCOSE 112 (H) 11/21/2020   BUN 13 11/21/2020   CREATININE 1.01 (H) 11/21/2020   CALCIUM 9.5 11/21/2020   EGFR 60 11/21/2020   GFRNONAA 46 (L) 09/13/2019   Lab Results  Component Value Date   CHOL 171 11/21/2020   HDL 61 11/21/2020   LDLCALC 84 11/21/2020   TRIG 150 (H) 11/21/2020   CHOLHDL 2.8 11/21/2020   Lab Results  Component Value Date   TSH 3.390 11/21/2020   Lab Results  Component Value Date   HGBA1C 6.4 (H) 11/21/2020   Lab Results  Component Value Date   WBC 6.5 01/22/2021   HGB 14.1 01/22/2021   HCT 41.5 01/22/2021   MCV 92.4 01/22/2021   PLT 328 01/22/2021   Lab Results  Component Value Date   ALT 23 05/22/2020   AST 23 05/22/2020   ALKPHOS 96 05/22/2020   BILITOT 0.5 05/22/2020   Lab Results  Component Value Date   VD25OH 33.5 06/07/2019     Review of Systems  Constitutional:  Negative for chills, fatigue and fever.  HENT:  Positive for congestion, ear pain and sinus pressure. Negative for trouble swallowing.   Respiratory:  Positive for cough, shortness of breath and wheezing. Negative for chest tightness.   Cardiovascular:  Negative for chest pain and palpitations.  Neurological:  Positive for headaches. Negative for dizziness, light-headedness and numbness.   Patient Active Problem List   Diagnosis Date Noted   Restless legs syndrome (RLS) 11/21/2020   Keratosis  09/18/2020   Carpal tunnel syndrome on right 03/20/2020   Cerebral microvascular disease 11/10/2019   Pleural effusion, left 10/19/2019   Aortic atherosclerosis ([place]) 10/08/2019   Personal history of colonic polyps    Insomnia 11/05/2016   Osteoarthritis of left hip 10/10/2015   Status post left hip replacement 10/10/2015   Prediabetes 01/03/2015   Anxiety 09/01/2014   Vitamin D deficiency 09/01/2014   Herpes zoster 09/01/2014   Fibromyalgia 11/15/2013   Recurrent major depressive disorder, in partial remission ([place]) 11/15/2013   Vitamin B12 deficiency 12/23/2011   Allergic rhinitis 04/18/2011    Allergies  Allergen Reactions   Amoxicillin-Pot Clavulanate Nausea And Vomiting   Beef-Derived Products Nausea And Vomiting   Pegademase Bovine Nausea And Vomiting   Penicillins Nausea And Vomiting and Other (See Comments)    Has patient had a PCN reaction causing immediate rash, facial/tongue/throat swelling, SOB or lightheadedness with hypotension: no Has patient had a PCN reaction causing severe rash involving mucus membranes or skin necrosis: No Has patient had a PCN reaction that required hospitalization No Has patient had a PCN reaction occurring within the last 10 years: Yes If all of the above answers are "NO", then may proceed with Cephalosporin use.         Past Surgical History:  Procedure Laterality Date   BLADDER REPAIR     COLON SURGERY  2011  10 inches removed for benign mass, does not absorb nutrients as well   COLONOSCOPY  2011   polyp removed- Dr Gustavo Lah   COLONOSCOPY WITH PROPOFOL N/A 07/04/2017   Procedure: COLONOSCOPY WITH PROPOFOL;  Surgeon: Lucilla Lame, MD;  Location: [place];  Service: Endoscopy;  Laterality: N/A;  recurrent shingles   GANGLION CYST EXCISION Right 01/24/2021   Procedure: EXCISION OF GANGLION CYST INVOLVING THE FLEXOR TENDON SHEATH OF RIGHT LITTLE FINGER.;  Surgeon: Corky Mull, MD;  Location: [facility];  Service:  Orthopedics;  Laterality: Right;   NASAL SINUS SURGERY     TONSILLECTOMY     TOTAL HIP ARTHROPLASTY Left 10/10/2015   Procedure: LEFT TOTAL HIP ARTHROPLASTY ANTERIOR APPROACH;  Surgeon: Mcarthur Rossetti, MD;  Location: [place];  Service: Orthopedics;  Laterality: Left;   TRIGGER FINGER RELEASE Left 03/28/2017   Procedure: MIDDLE FINGER DUPREYTRENS RELEASE;  Surgeon: Leanor Kail, MD;  Location: [place];  Service: Orthopedics;  Laterality: Left;   TUBAL LIGATION     TURBINATE REDUCTION Bilateral 04/29/2019   Procedure: TURBINATE REDUCTION WITH VIVEAR;  Surgeon: Margaretha Sheffield, MD;  Location: [place];  Service: ENT;  Laterality: Bilateral;  vivear   VAGINAL HYSTERECTOMY     partial    Social History   Tobacco Use   Smoking status: Never   Smokeless tobacco: Never  Vaping Use   Vaping Use: Never used  Substance Use Topics   Alcohol use: No    Alcohol/week: 0.0 standard drinks   Drug use: No     Medication list has been reviewed and updated.  Current Meds  Medication Sig   albuterol (VENTOLIN HFA) 108 (90 Base) MCG/ACT inhaler Inhale 2 puffs into the lungs every 6 (six) hours as needed for wheezing or shortness of breath.   albuterol (VENTOLIN HFA) 108 (90 Base) MCG/ACT inhaler Inhale 2 puffs into the lungs every 6 (six) hours as needed for wheezing or shortness of breath.   atorvastatin (LIPITOR) 10 MG tablet Take 1 tablet (10 mg total) by mouth in the morning.   cefdinir (OMNICEF) 300 MG capsule Take 300 mg by mouth 2 (two) times daily.   Cholecalciferol (VITAMIN D3) 5000 UNITS CAPS Take 1 capsule (5,000 Units total) by mouth daily.   Cyanocobalamin (VITAMIN B-12) 5000 MCG SUBL Place 15,000 mcg under the tongue daily.   DULoxetine (CYMBALTA) 60 MG capsule TAKE 2 CAPSULES BY MOUTH DAILY   fluticasone (FLONASE) 50 MCG/ACT nasal spray Place 2 sprays into both nostrils daily as needed for allergies.   Multiple Vitamins-Minerals (MULTIVITAMIN WOMEN 50+)  TABS Take 1 tablet by mouth daily.   rOPINIRole (REQUIP) 0.25 MG tablet Take 1 tablet (0.25 mg total) by mouth at bedtime as needed (restless legs).   sodium chloride (OCEAN) 0.65 % SOLN nasal spray Place 1 spray into both nostrils as needed for congestion.   traZODone (DESYREL) 100 MG tablet TAKE 2 TABLETS(200 MG) BY MOUTH AT BEDTIME   valACYclovir (VALTREX) 1000 MG tablet Take 1 tablet (1,000 mg total) by mouth daily.    PHQ 2/9 Scores 11/21/2020 09/18/2020 07/13/2020 07/12/2020  PHQ - 2 Score 0 0 0 0  PHQ- 9 Score 0 0 2 0    GAD 7 : Generalized Anxiety Score 11/21/2020 09/18/2020 07/13/2020 07/12/2020  Nervous, Anxious, on Edge 0 1 0 0  Control/stop worrying 0 0 0 0  Worry too much - different things 0 0 0 0  Trouble relaxing 0 0 0 0  Restless 0 0  0 0  Easily annoyed or irritable 0 0 0 0  Afraid - awful might happen 0 1 0 0  Total GAD 7 Score 0 2 0 0  Anxiety Difficulty - - - Not difficult at all    BP Readings from Last 3 Encounters:  02/06/21 112/82  02/01/21 130/82  01/24/21 118/71    Physical Exam Vitals and nursing note reviewed.  Constitutional:      General: She is not in acute distress.    Appearance: Normal appearance. She is well-developed.  HENT:     Head: Normocephalic and atraumatic.     Right Ear: Tympanic membrane is retracted. Tympanic membrane is not erythematous.     Left Ear: Tympanic membrane is retracted. Tympanic membrane is not erythematous.     Nose:     Right Sinus: Maxillary sinus tenderness present. No frontal sinus tenderness.     Left Sinus: Maxillary sinus tenderness present. No frontal sinus tenderness.     Mouth/Throat:     Pharynx: Posterior oropharyngeal erythema present.     Comments: Thick mucoid drainage Cardiovascular:     Rate and Rhythm: Normal rate and regular rhythm.     Heart sounds: Normal heart sounds. No murmur heard. Pulmonary:     Effort: Pulmonary effort is normal. No respiratory distress.     Breath sounds: Normal breath  sounds. No decreased breath sounds, wheezing or rhonchi.  Musculoskeletal:     Cervical back: Normal range of motion.  Skin:    General: Skin is warm and dry.     Findings: No rash.  Neurological:     Mental Status: She is alert and oriented to person, place, and time.  Psychiatric:        Mood and Affect: Mood normal.        Behavior: Behavior normal.    Wt Readings from Last 3 Encounters:  02/06/21 233 lb 6.4 oz (105.9 kg)  02/01/21 233 lb (105.7 kg)  01/24/21 238 lb 15.7 oz (108.4 kg)    BP 112/82    Pulse 98    Temp 97.8 F (36.6 C) (Oral)    Ht 5' 8"  (1.727 m)    Wt 233 lb 6.4 oz (105.9 kg)    SpO2 100%    BMI 35.49 kg/m   Assessment and Plan: 1. Acute non-recurrent maxillary sinusitis She has taken levofloxacin and partial Rx of Cefdinir. She has chronic sinusitis and is taking Flonase and sudafed. Recommend another full course of Cefdinir and Coricidin HBP instead of sudafed. May need to follow up with ENT. - cefdinir (OMNICEF) 300 MG capsule; Take 1 capsule (300 mg total) by mouth 2 (two) times daily.  Dispense: 14 capsule; Refill: 0   Partially dictated using Editor, commissioning. Any errors are unintentional.  Halina Maidens, MD [facility]  02/06/2021

## 2021-02-11 ENCOUNTER — Other Ambulatory Visit: Payer: Self-pay | Admitting: Internal Medicine

## 2021-02-11 DIAGNOSIS — M797 Fibromyalgia: Secondary | ICD-10-CM

## 2021-02-12 ENCOUNTER — Other Ambulatory Visit: Payer: Self-pay | Admitting: Family Medicine

## 2021-02-13 NOTE — Telephone Encounter (Signed)
Requested Prescriptions  Pending Prescriptions Disp Refills   DULoxetine (CYMBALTA) 60 MG capsule [Pharmacy Med Name: DULOXETINE DR 60MG  CAPSULES] 180 capsule 0    Sig: TAKE 2 CAPSULES BY MOUTH DAILY     Psychiatry: Antidepressants - SNRI Passed - 02/11/2021  6:20 AM      Passed - Completed PHQ-2 or PHQ-9 in the last 360 days      Passed - Last BP in normal range    BP Readings from Last 1 Encounters:  02/06/21 112/82         Passed - Valid encounter within last 6 months    Recent Outpatient Visits          1 week ago Acute non-recurrent maxillary sinusitis   [facility] 12-23-1978, MD   2 months ago Aortic atherosclerosis [facility])   [facility], MD   4 months ago [facility], MD   7 months ago COVID-19 virus infection   [facility], MD   8 months ago Fibromyalgia   [facility], MD      Future Appointments            In 9 months Reubin Milan Judithann Graves, MD [facility]

## 2021-02-14 ENCOUNTER — Ambulatory Visit (INDEPENDENT_AMBULATORY_CARE_PROVIDER_SITE_OTHER): Payer: Medicare Other

## 2021-02-14 DIAGNOSIS — Z0000 Encounter for general adult medical examination without abnormal findings: Secondary | ICD-10-CM | POA: Diagnosis not present

## 2021-02-14 NOTE — Progress Notes (Signed)
Subjective:   Ann Peters is a 70 y.o. female who presents for Medicare Annual (Subsequent) preventive examination.  Virtual Visit via Telephone Note  I connected with  Ann Peters on 02/14/21 at  2:40 PM EST by telephone and verified that I am speaking with the correct person using two identifiers.  Location: Patient: home Provider: [facility] Persons participating in the virtual visit: Ann Peters   I discussed the limitations, risks, security and privacy concerns of performing an evaluation and management service by telephone and the availability of in person appointments. The patient expressed understanding and agreed to proceed.  Interactive audio and video telecommunications were attempted between this nurse and patient, however failed, due to patient having technical difficulties OR patient did not have access to video capability.  We continued and completed visit with audio only.  Some vital signs may be absent or patient reported.   Clemetine Marker, LPN   Review of Systems     Cardiac Risk Factors include: advanced age (>22men, >30 women);dyslipidemia;obesity (BMI >30kg/m2)     Objective:    There were no vitals filed for this visit. There is no height or weight on file to calculate BMI.  Advanced Directives 02/14/2021 01/24/2021 01/18/2021 08/31/2019 04/29/2019 03/08/2019 02/22/2019  Does Patient Have a Medical Advance Directive? No No No No No No No  Would patient like information on creating a medical advance directive? No - Patient declined No - Patient declined - - No - Patient declined Yes (MAU/Ambulatory/Procedural Areas - Information given) -    Current Medications (verified) Outpatient Encounter Medications as of 02/14/2021  Medication Sig   albuterol (VENTOLIN HFA) 108 (90 Base) MCG/ACT inhaler Inhale 2 puffs into the lungs every 6 (six) hours as needed for wheezing or shortness of breath.   atorvastatin (LIPITOR) 10 MG tablet Take 1 tablet (10 mg  total) by mouth in the morning.   cefdinir (OMNICEF) 300 MG capsule Take 1 capsule (300 mg total) by mouth 2 (two) times daily.   Cholecalciferol (VITAMIN D3) 5000 UNITS CAPS Take 1 capsule (5,000 Units total) by mouth daily.   DULoxetine (CYMBALTA) 60 MG capsule TAKE 2 CAPSULES BY MOUTH DAILY   fluticasone (FLONASE) 50 MCG/ACT nasal spray Place 2 sprays into both nostrils daily as needed for allergies.   Multiple Vitamins-Minerals (MULTIVITAMIN WOMEN 50+) TABS Take 1 tablet by mouth daily.   rOPINIRole (REQUIP) 0.25 MG tablet Take 1 tablet (0.25 mg total) by mouth at bedtime as needed (restless legs).   sodium chloride (OCEAN) 0.65 % SOLN nasal spray Place 1 spray into both nostrils as needed for congestion.   traZODone (DESYREL) 100 MG tablet TAKE 2 TABLETS(200 MG) BY MOUTH AT BEDTIME   valACYclovir (VALTREX) 1000 MG tablet Take 1 tablet (1,000 mg total) by mouth daily.   vitamin B-12 (CYANOCOBALAMIN) 1000 MCG tablet Take 1,000 mcg by mouth daily.   [DISCONTINUED] albuterol (VENTOLIN HFA) 108 (90 Base) MCG/ACT inhaler Inhale 2 puffs into the lungs every 6 (six) hours as needed for wheezing or shortness of breath.   [DISCONTINUED] Cyanocobalamin (VITAMIN B-12) 5000 MCG SUBL Place 15,000 mcg under the tongue daily.   No facility-administered encounter medications on file as of 02/14/2021.    Allergies (verified) Amoxicillin-pot clavulanate, Beef-derived products, Pegademase bovine, and Penicillins   History: Past Medical History:  Diagnosis Date   Allergy    Anemia 02/17/2017   Anxiety    Asthma    well controlled   Cerebral microvascular disease  Depression    Diverticulitis 09/01/2014   History of shingles    ON AND OFF 6 YEARS, recurrent episodes of internal and external shingles with resulting neuralgia   Neuromuscular disorder (HCC)    POSTHERPETIC NEURALGIA, left wrist weakness   Pneumonia    HX   SEVERAL TIMES  NONE IN 3 YRS   Post herpetic neuralgia    Pre-diabetes     Restless leg syndrome    Scoliosis    Shingles    history of.  Recurrent   Sinus infection 01/18/2021   taking levaquin   Sinusitis 10/18/2016   Stroke [facility])    mild per pt and was unaware of this   Past Surgical History:  Procedure Laterality Date   BLADDER REPAIR     COLON SURGERY  2011   10 inches removed for benign mass, does not absorb nutrients as well   COLONOSCOPY  2011   polyp removed- Dr Gustavo Lah   COLONOSCOPY WITH PROPOFOL N/A 07/04/2017   Procedure: COLONOSCOPY WITH PROPOFOL;  Surgeon: Lucilla Lame, MD;  Location: [place];  Service: Endoscopy;  Laterality: N/A;  recurrent shingles   GANGLION CYST EXCISION Right 01/24/2021   Procedure: EXCISION OF GANGLION CYST INVOLVING THE FLEXOR TENDON SHEATH OF RIGHT LITTLE FINGER.;  Surgeon: Corky Mull, MD;  Location: [facility];  Service: Orthopedics;  Laterality: Right;   NASAL SINUS SURGERY     TONSILLECTOMY     TOTAL HIP ARTHROPLASTY Left 10/10/2015   Procedure: LEFT TOTAL HIP ARTHROPLASTY ANTERIOR APPROACH;  Surgeon: Mcarthur Rossetti, MD;  Location: [place];  Service: Orthopedics;  Laterality: Left;   TRIGGER FINGER RELEASE Left 03/28/2017   Procedure: MIDDLE FINGER DUPREYTRENS RELEASE;  Surgeon: Leanor Kail, MD;  Location: [place];  Service: Orthopedics;  Laterality: Left;   TUBAL LIGATION     TURBINATE REDUCTION Bilateral 04/29/2019   Procedure: TURBINATE REDUCTION WITH VIVEAR;  Surgeon: Margaretha Sheffield, MD;  Location: [place];  Service: ENT;  Laterality: Bilateral;  vivear   VAGINAL HYSTERECTOMY     partial   Family History  Problem Relation Age of Onset   Lymphoma Mother    Sarcoidosis Mother    CAD Father    Breast cancer Sister 78   Brain cancer Sister    Social History   Socioeconomic History   Marital status: Married    Spouse name: Not on file   Number of children: 3   Years of education: Not on file   Highest education level: High school graduate  Occupational  History   Occupation: Retired  Tobacco Use   Smoking status: Never   Smokeless tobacco: Never  Scientific laboratory technician Use: Never used  Substance and Sexual Activity   Alcohol use: No    Alcohol/week: 0.0 standard drinks   Drug use: No   Sexual activity: Yes  Other Topics Concern   Not on file  Social History Narrative   Not on file   Social Determinants of Health   Financial Resource Strain: Low Risk    Difficulty of Paying Living Expenses: Not hard at all  Food Insecurity: No Food Insecurity   Worried About Charity fundraiser in the Last Year: Never true   [place] in the Last Year: Never true  Transportation Needs: No Transportation Needs   Lack of Transportation (Medical): No   Lack of Transportation (Non-Medical): No  Physical Activity: Insufficiently Active   Days of Exercise per Week: 7 days  Minutes of Exercise per Session: 10 min  Stress: No Stress Concern Present   Feeling of Stress : Not at all  Social Connections: Moderately Isolated   Frequency of Communication with Friends and Family: More than three times a week   Frequency of Social Gatherings with Friends and Family: Once a week   Attends Religious Services: Never   Marine scientist or Organizations: No   Attends Music therapist: Never   Marital Status: Married    Tobacco Counseling Counseling given: Not Answered   Clinical Intake:  Pre-visit preparation completed: Yes  Pain : No/denies pain     Nutritional Risks: None Diabetes: No  How often do you need to have someone help you when you read instructions, pamphlets, or other written materials from your doctor or pharmacy?: 1 - Never    Interpreter Needed?: No  Information entered by :: Clemetine Marker LPN   Activities of Daily Living In your present state of health, do you have any difficulty performing the following activities: 02/14/2021 01/18/2021  Hearing? N N  Vision? N N  Difficulty concentrating or  making decisions? N N  Walking or climbing stairs? N N  Dressing or bathing? N N  Doing errands, shopping? N N  Preparing Food and eating ? N -  Using the Toilet? N -  In the past six months, have you accidently leaked urine? N -  Do you have problems with loss of bowel control? N -  Managing your Medications? N -  Managing your Finances? N -  Housekeeping or managing your Housekeeping? N -  Some recent data might be hidden    Patient Care Team: Glean Hess, MD as PCP - General (Internal Medicine) Margaretha Sheffield, MD as Consulting Physician (Otolaryngology) Mcarthur Rossetti, MD as Consulting Physician (Orthopedic Surgery) Tyler Pita, MD as Consulting Physician (Pulmonary Disease)  Indicate any recent Medical Services you may have received from other than [facility] providers in the past year (date may be approximate).     Assessment:   This is a routine wellness examination for Ron.  Hearing/Vision screen Hearing Screening - Comments:: Pt denies hearing difficulty Vision Screening - Comments:: Annual [facility] screenings [facility]; due for exam   Dietary issues and exercise activities discussed: Current Exercise Habits: Home exercise routine, Type of exercise: stretching;walking, Time (Minutes): 10, Frequency (Times/Week): 7, Weekly Exercise (Minutes/Week): 70, Intensity: Mild, Exercise limited by: None identified   Goals Addressed   None    Depression Screen PHQ 2/9 Scores 02/14/2021 11/21/2020 09/18/2020 07/13/2020 07/12/2020 05/22/2020 03/20/2020  PHQ - 2 Score 0 0 0 0 0 0 0  PHQ- 9 Score - 0 0 2 0 0 0    Fall Risk Fall Risk  02/14/2021 11/21/2020 09/18/2020 07/13/2020 07/12/2020  Falls in the past year? 0 1 0 0 -  Number falls in past yr: 0 0 0 - 0  Comment - - - - -  Injury with Fall? 0 1 0 - 0  Risk for fall due to : No Fall Risks History of fall(s) No Fall Risks - -  Follow up Falls prevention discussed Falls evaluation completed Falls  evaluation completed Falls evaluation completed Falls evaluation completed    [place]:  Any stairs in or around the home? Yes  If so, are there any without handrails? No  Home free of loose throw rugs in walkways, pet beds, electrical cords, etc? Yes  Adequate  lighting in your home to reduce risk of falls? Yes   ASSISTIVE DEVICES UTILIZED TO PREVENT FALLS:  Life alert? No  Use of a cane, walker or w/c? No  Grab bars in the bathroom? Yes  Shower chair or bench in shower? Yes  Elevated toilet seat or a handicapped toilet? No  TIMED UP AND GO:  Was the test performed? No . Telephonic visit.   Cognitive Function: Normal cognitive status assessed by direct observation by this Nurse Health Advisor. No abnormalities found.       6CIT Screen 02/04/2018 01/20/2017  What Year? 0 points 0 points  What month? 0 points 0 points  What time? 0 points 0 points  Count back from 20 0 points 0 points  Months in reverse 0 points 0 points  Repeat phrase 0 points 4 points  Total Score 0 4    Immunizations Immunization History  Administered Date(s) Administered   Fluad Quad(high Dose 65+) 11/17/2018, 11/10/2019, 11/21/2020   Influenza, High Dose Seasonal PF 11/05/2016, 02/04/2018   Influenza,inj,Quad PF,6+ Mos 01/03/2015, 01/22/2016   PFIZER(Purple Top)SARS-COV-2 Vaccination 03/23/2019, 05/03/2019, 11/16/2019   Pneumococcal Conjugate-13 10/03/2014   Pneumococcal Polysaccharide-23 05/08/2015   Tdap 10/03/2014   Zoster, Live 05/30/2011    TDAP status: Up to date  Flu Vaccine status: Up to date  Pneumococcal vaccine status: Up to date  Covid-19 vaccine status: Completed vaccines  Qualifies for Shingles Vaccine? Yes   Zostavax completed Yes   Shingrix Completed?: No.    Education has been provided regarding the importance of this vaccine. Patient has been advised to call insurance company to determine out of pocket expense if they have not yet  received this vaccine. Advised may also receive vaccine at local pharmacy or Health Dept. Verbalized acceptance and understanding.  Screening Tests Health Maintenance  Topic Date Due   Zoster Vaccines- Shingrix (1 of 2) Never done   MAMMOGRAM  03/29/2020   COVID-19 Vaccine (4 - Booster for Pfizer series) 02/17/2021 (Originally 01/11/2020)   COLONOSCOPY (Pts 45-28yrs Insurance coverage will need to be confirmed)  07/05/2022   TETANUS/TDAP  10/02/2024   Pneumonia Vaccine 79+ Years old  Completed   INFLUENZA VACCINE  Completed   DEXA SCAN  Completed   Hepatitis C Screening  Completed   HPV VACCINES  Aged Out    Health Maintenance  Health Maintenance Due  Topic Date Due   Zoster Vaccines- Shingrix (1 of 2) Never done   MAMMOGRAM  03/29/2020    Colorectal cancer screening: Type of screening: Colonoscopy. Completed 07/04/17. Repeat every 5 years  Mammogram status: Completed 03/30/19. Repeat every year. Ordered 11/21/20  Bone Density status: Completed 03/30/19. Results reflect: Bone density results: NORMAL. Repeat every 2-3 years.  Lung Cancer Screening: (Low Dose CT Chest recommended if Age 14-80 years, 30 pack-year currently smoking OR have quit w/in 15years.) does not qualify.   Additional Screening:  Hepatitis C Screening: does qualify; Completed 01/20/17  Vision Screening: Recommended annual ophthalmology exams for early detection of glaucoma and other disorders of the eye. Is the patient up to date with their annual eye exam?  No Who is the provider or what is the name of the office in which the patient attends annual eye exams? [facility].   Dental Screening: Recommended annual dental exams for proper oral hygiene  Community Resource Referral / Chronic Care Management: CRR required this visit?  No   CCM required this visit?  No      Plan:  I have personally reviewed and noted the following in the patients chart:   Medical and social history Use of  alcohol, tobacco or illicit drugs  Current medications and supplements including opioid prescriptions.  Functional ability and status Nutritional status Physical activity Advanced directives List of other physicians Hospitalizations, surgeries, and ER visits in previous 12 months Vitals Screenings to include cognitive, depression, and falls Referrals and appointments  In addition, I have reviewed and discussed with patient certain preventive protocols, quality metrics, and best practice recommendations. A written personalized care plan for preventive services as well as general preventive health recommendations were provided to patient.     Clemetine Marker, LPN   [id number]   Nurse Notes: pt states she is getting over a sinus infection and feels like the antibiotics have given her a yeast infection. She is taking OTC miconazole and eating yogurt; pt aware to contact office if sxs worsen or persist

## 2021-02-14 NOTE — Patient Instructions (Signed)
Ann Peters , Thank you for taking time to come for your Medicare Wellness Visit. I appreciate your ongoing commitment to your health goals. Please review the following plan we discussed and let me know if I can assist you in the future.   Screening recommendations/referrals: Colonoscopy: done 07/04/17. Repeat 06/2027 Mammogram: done 03/30/19. Please call [phone] to schedule your mammogram.  Bone Density: done 03/30/19 Recommended yearly ophthalmology/optometry visit for glaucoma screening and checkup Recommended yearly dental visit for hygiene and checkup  Vaccinations: Influenza vaccine: done 11/21/20 Pneumococcal vaccine: done 05/08/15 Tdap vaccine: done 10/03/14 Shingles vaccine: Shingrix discussed. Please contact your pharmacy for coverage information.  Covid-19:done 03/23/19, 05/03/19 & 11/16/19  Advanced directives: Please bring a copy of your health care power of attorney and living will to the office at your convenience once you have completed that paperwork  Conditions/risks identified: Keep up the great work!  Next appointment: Follow up in one year for your annual wellness visit    Preventive Care 65 Years and Older, Female Preventive care refers to lifestyle choices and visits with your health care provider that can promote health and wellness. What does preventive care include? A yearly physical exam. This is also called an annual well check. Dental exams once or twice a year. Routine eye exams. Ask your health care provider how often you should have your eyes checked. Personal lifestyle choices, including: Daily care of your teeth and gums. Regular physical activity. Eating a healthy diet. Avoiding tobacco and drug use. Limiting alcohol use. Practicing safe sex. Taking low-dose aspirin every day. Taking vitamin and mineral supplements as recommended by your health care provider. What happens during an annual well check? The services and screenings done by your health  care provider during your annual well check will depend on your age, overall health, lifestyle risk factors, and family history of disease. Counseling  Your health care provider may ask you questions about your: Alcohol use. Tobacco use. Drug use. Emotional well-being. Home and relationship well-being. Sexual activity. Eating habits. History of falls. Memory and ability to understand (cognition). Work and work Astronomer. Reproductive health. Screening  You may have the following tests or measurements: Height, weight, and BMI. Blood pressure. Lipid and cholesterol levels. These may be checked every 5 years, or more frequently if you are over 10 years old. Skin check. Lung cancer screening. You may have this screening every year starting at age 2 if you have a 30-pack-year history of smoking and currently smoke or have quit within the past 15 years. Fecal occult blood test (FOBT) of the stool. You may have this test every year starting at age 44. Flexible sigmoidoscopy or colonoscopy. You may have a sigmoidoscopy every 5 years or a colonoscopy every 10 years starting at age 110. Hepatitis C blood test. Hepatitis B blood test. Sexually transmitted disease (STD) testing. Diabetes screening. This is done by checking your blood sugar (glucose) after you have not eaten for a while (fasting). You may have this done every 1-3 years. Bone density scan. This is done to screen for osteoporosis. You may have this done starting at age 32. Mammogram. This may be done every 1-2 years. Talk to your health care provider about how often you should have regular mammograms. Talk with your health care provider about your test results, treatment options, and if necessary, the need for more tests. Vaccines  Your health care provider may recommend certain vaccines, such as: Influenza vaccine. This is recommended every year. Tetanus, diphtheria, and acellular pertussis (  Tdap, Td) vaccine. You may need a Td  booster every 10 years. Zoster vaccine. You may need this after age 37. Pneumococcal 13-valent conjugate (PCV13) vaccine. One dose is recommended after age 34. Pneumococcal polysaccharide (PPSV23) vaccine. One dose is recommended after age 21. Talk to your health care provider about which screenings and vaccines you need and how often you need them. This information is not intended to replace advice given to you by your health care provider. Make sure you discuss any questions you have with your health care provider. Document Released: 03/03/2015 Document Revised: 10/25/2015 Document Reviewed: 12/06/2014 Elsevier Interactive Patient Education  2017 [place] Prevention in the Home Falls can cause injuries. They can happen to people of all ages. There are many things you can do to make your home safe and to help prevent falls. What can I do on the outside of my home? Regularly fix the edges of walkways and driveways and fix any cracks. Remove anything that might make you trip as you walk through a door, such as a raised step or threshold. Trim any bushes or trees on the path to your home. Use bright outdoor lighting. Clear any walking paths of anything that might make someone trip, such as rocks or tools. Regularly check to see if handrails are loose or broken. Make sure that both sides of any steps have handrails. Any raised decks and porches should have guardrails on the edges. Have any leaves, snow, or ice cleared regularly. Use sand or salt on walking paths during winter. Clean up any spills in your garage right away. This includes oil or grease spills. What can I do in the bathroom? Use night lights. Install grab bars by the toilet and in the tub and shower. Do not use towel bars as grab bars. Use non-skid mats or decals in the tub or shower. If you need to sit down in the shower, use a plastic, non-slip stool. Keep the floor dry. Clean up any water that spills on the floor  as soon as it happens. Remove soap buildup in the tub or shower regularly. Attach bath mats securely with double-sided non-slip rug tape. Do not have throw rugs and other things on the floor that can make you trip. What can I do in the bedroom? Use night lights. Make sure that you have a light by your bed that is easy to reach. Do not use any sheets or blankets that are too big for your bed. They should not hang down onto the floor. Have a firm chair that has side arms. You can use this for support while you get dressed. Do not have throw rugs and other things on the floor that can make you trip. What can I do in the kitchen? Clean up any spills right away. Avoid walking on wet floors. Keep items that you use a lot in easy-to-reach places. If you need to reach something above you, use a strong step stool that has a grab bar. Keep electrical cords out of the way. Do not use floor polish or wax that makes floors slippery. If you must use wax, use non-skid floor wax. Do not have throw rugs and other things on the floor that can make you trip. What can I do with my stairs? Do not leave any items on the stairs. Make sure that there are handrails on both sides of the stairs and use them. Fix handrails that are broken or loose. Make sure that handrails are  as long as the stairways. Check any carpeting to make sure that it is firmly attached to the stairs. Fix any carpet that is loose or worn. Avoid having throw rugs at the top or bottom of the stairs. If you do have throw rugs, attach them to the floor with carpet tape. Make sure that you have a light switch at the top of the stairs and the bottom of the stairs. If you do not have them, ask someone to add them for you. What else can I do to help prevent falls? Wear shoes that: Do not have high heels. Have rubber bottoms. Are comfortable and fit you well. Are closed at the toe. Do not wear sandals. If you use a stepladder: Make sure that it is  fully opened. Do not climb a closed stepladder. Make sure that both sides of the stepladder are locked into place. Ask someone to hold it for you, if possible. Clearly mark and make sure that you can see: Any grab bars or handrails. First and last steps. Where the edge of each step is. Use tools that help you move around (mobility aids) if they are needed. These include: Canes. Walkers. Scooters. Crutches. Turn on the lights when you go into a dark area. Replace any light bulbs as soon as they burn out. Set up your furniture so you have a clear path. Avoid moving your furniture around. If any of your floors are uneven, fix them. If there are any pets around you, be aware of where they are. Review your medicines with your doctor. Some medicines can make you feel dizzy. This can increase your chance of falling. Ask your doctor what other things that you can do to help prevent falls. This information is not intended to replace advice given to you by your health care provider. Make sure you discuss any questions you have with your health care provider. Document Released: 12/01/2008 Document Revised: 07/13/2015 Document Reviewed: 03/11/2014 Elsevier Interactive Patient Education  2017 [employer].

## 2021-02-18 IMAGING — CR DG CHEST 2V
1 series · 2 of 2 positions shown · non-contrast
Comparison: September 15, 2019

CLINICAL DATA: Evaluate pleural effusion.

EXAM:
CHEST - 2 VIEW

[Series 1: dg chest 2 view · 0.14mm/px · 2 of 2 slices shown]
[im 1/2]
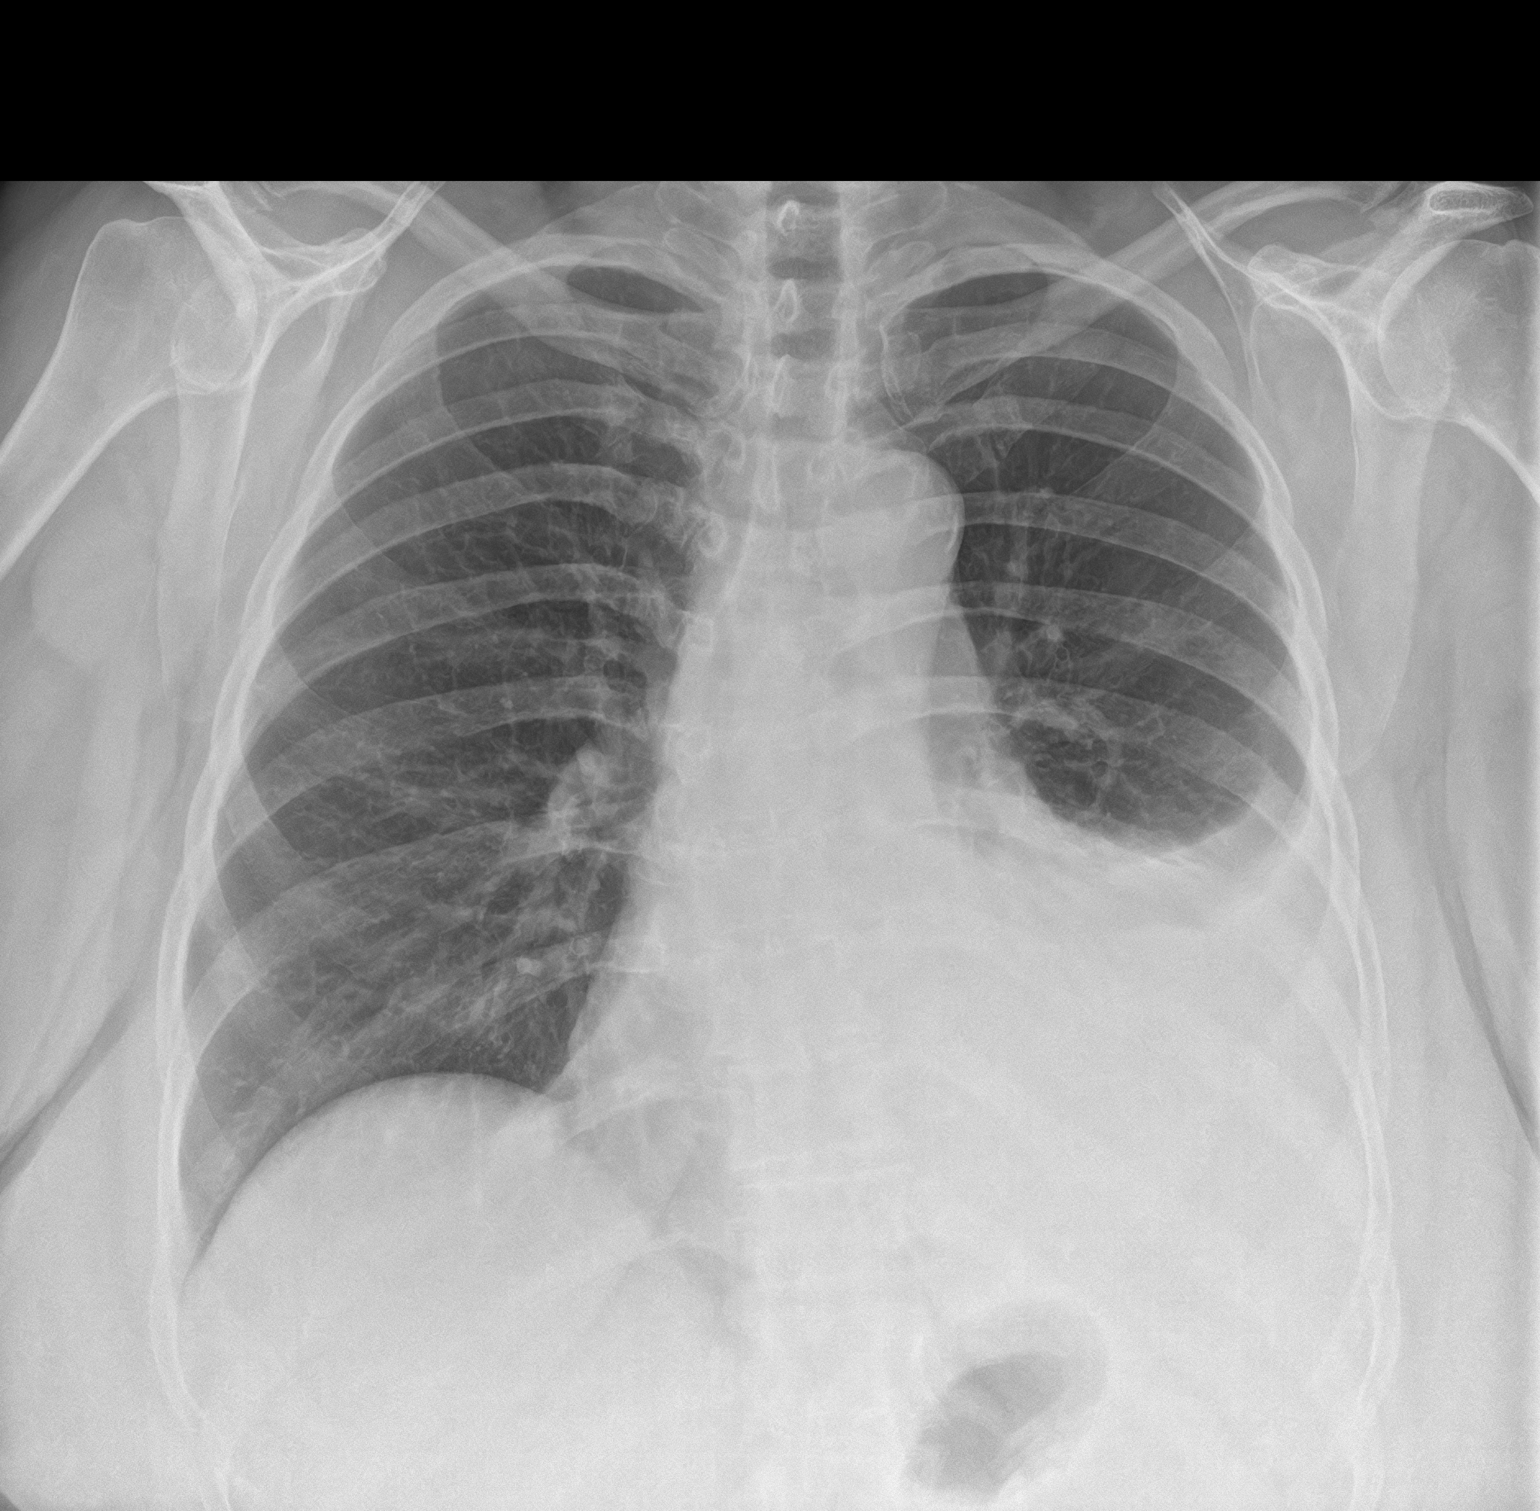
[im 2/2]
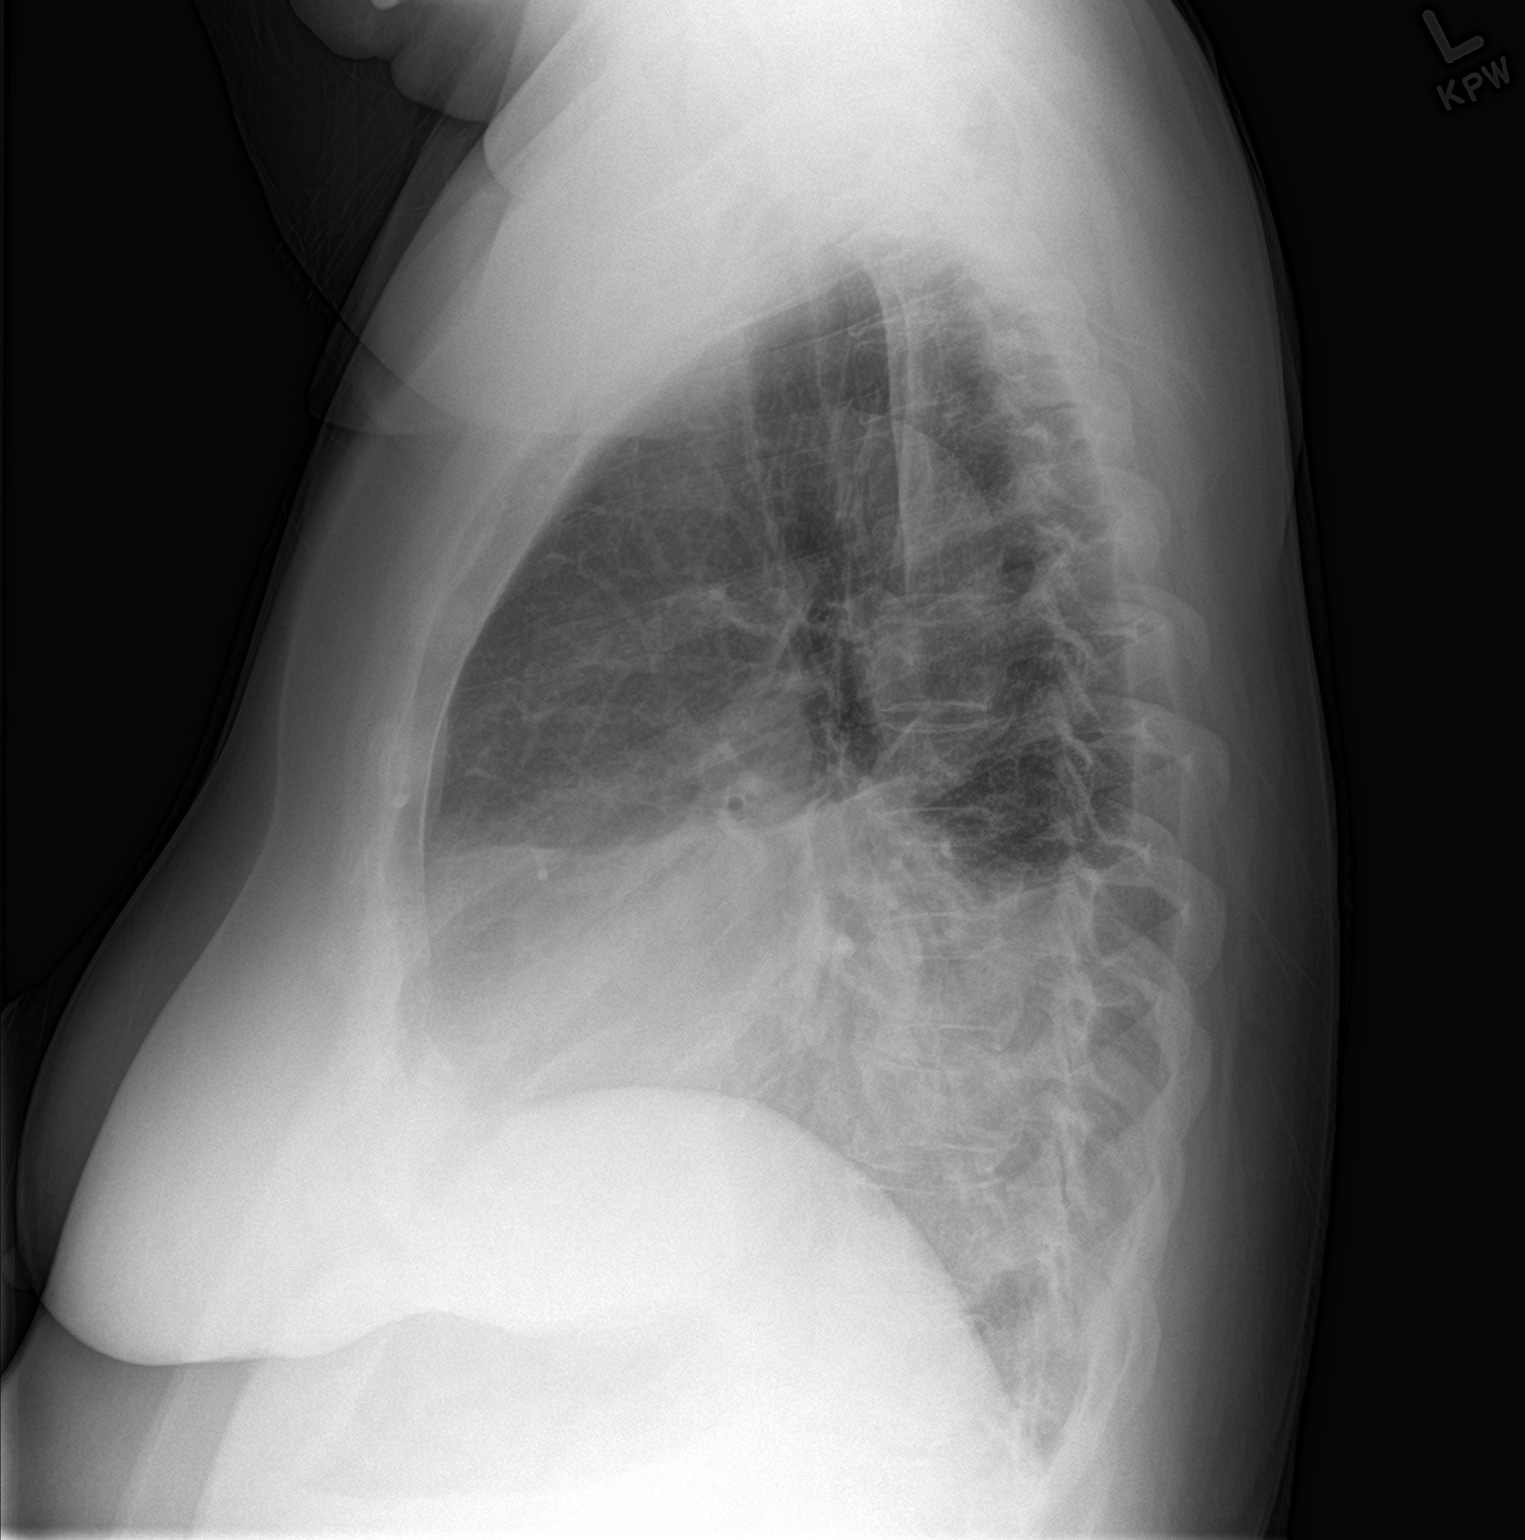

[2 of 2 positions shown; findings below may reference images not displayed]

FINDINGS: A predominant stable small to moderate size left pleural effusion is
seen. Associated left basilar atelectasis and/or infiltrate is
suspected. No pneumothorax is identified. The heart size and
mediastinal contours are within normal limits. The visualized
skeletal structures are unremarkable.
IMPRESSION: Stable small to moderate size left pleural effusion with associated
left basilar atelectasis and/or infiltrate.

## 2021-03-01 ENCOUNTER — Encounter: Payer: Self-pay | Admitting: Pulmonary Disease

## 2021-03-01 DIAGNOSIS — R058 Other specified cough: Secondary | ICD-10-CM

## 2021-03-01 DIAGNOSIS — R0602 Shortness of breath: Secondary | ICD-10-CM

## 2021-03-01 NOTE — Telephone Encounter (Signed)
At this point I do not think that the pain is coming from her lung.  She has significant scoliosis of the spine and I wonder if she is having some nerve impingement.  Her inhaler is for asthma and I do not anticipate that it is going to change much on pain that is referred from the spine.  She needs to make an appointment with me or one of the nurse practitioner so we can assess the necessity for this chest CT.  Prior chest CT and prior chest x-rays have not shown a lung issue to cause this pain therefore he must be coming from either musculature or nerve impingement.

## 2021-03-02 ENCOUNTER — Other Ambulatory Visit: Payer: Self-pay

## 2021-03-02 ENCOUNTER — Encounter: Payer: Self-pay | Admitting: Internal Medicine

## 2021-03-02 DIAGNOSIS — J90 Pleural effusion, not elsewhere classified: Secondary | ICD-10-CM

## 2021-03-02 DIAGNOSIS — J4541 Moderate persistent asthma with (acute) exacerbation: Secondary | ICD-10-CM

## 2021-03-02 NOTE — Telephone Encounter (Signed)
This is not what she mentions on her prior message.  She definitely needs a visit.  I would recommend an appointment as soon as possible with either me or one of the nurse practitioners.  Order a chest x-ray to be done on the day of follow-up.  She likely has what we call a postinfectious cough that can take some time to resolve.  If she does not have a rescue inhaler we can call 1 in for her that can help with her bouts of coughing, she can also use extra strength Mucinex DM over-the-counter twice a day.

## 2021-03-02 NOTE — Telephone Encounter (Signed)
Dr. Gonzalez, please advise. Thanks 

## 2021-03-07 ENCOUNTER — Telehealth: Payer: Self-pay | Admitting: Internal Medicine

## 2021-03-07 NOTE — Telephone Encounter (Signed)
Faxed referral order as requested.

## 2021-03-07 NOTE — Telephone Encounter (Signed)
Copied from CRM [id number]. Topic: Referral - Status >> Mar 07, 2021 12:01 PM McGill, Darlina Rumpf wrote: Reason for CRM:Pt stated was advised by Dr. Terence Lux office to please resend referral information to [phone], attention: April.

## 2021-03-16 IMAGING — CR DG CHEST 2V
1 series · 2 of 2 positions shown · non-contrast
Comparison: 10/04/2019.

CLINICAL DATA: Left pleural effusion follow-up.

EXAM:
CHEST - 2 VIEW

[Series 1: dg chest 2 view · 0.14mm/px · 2 of 2 slices shown]
[im 1/2]
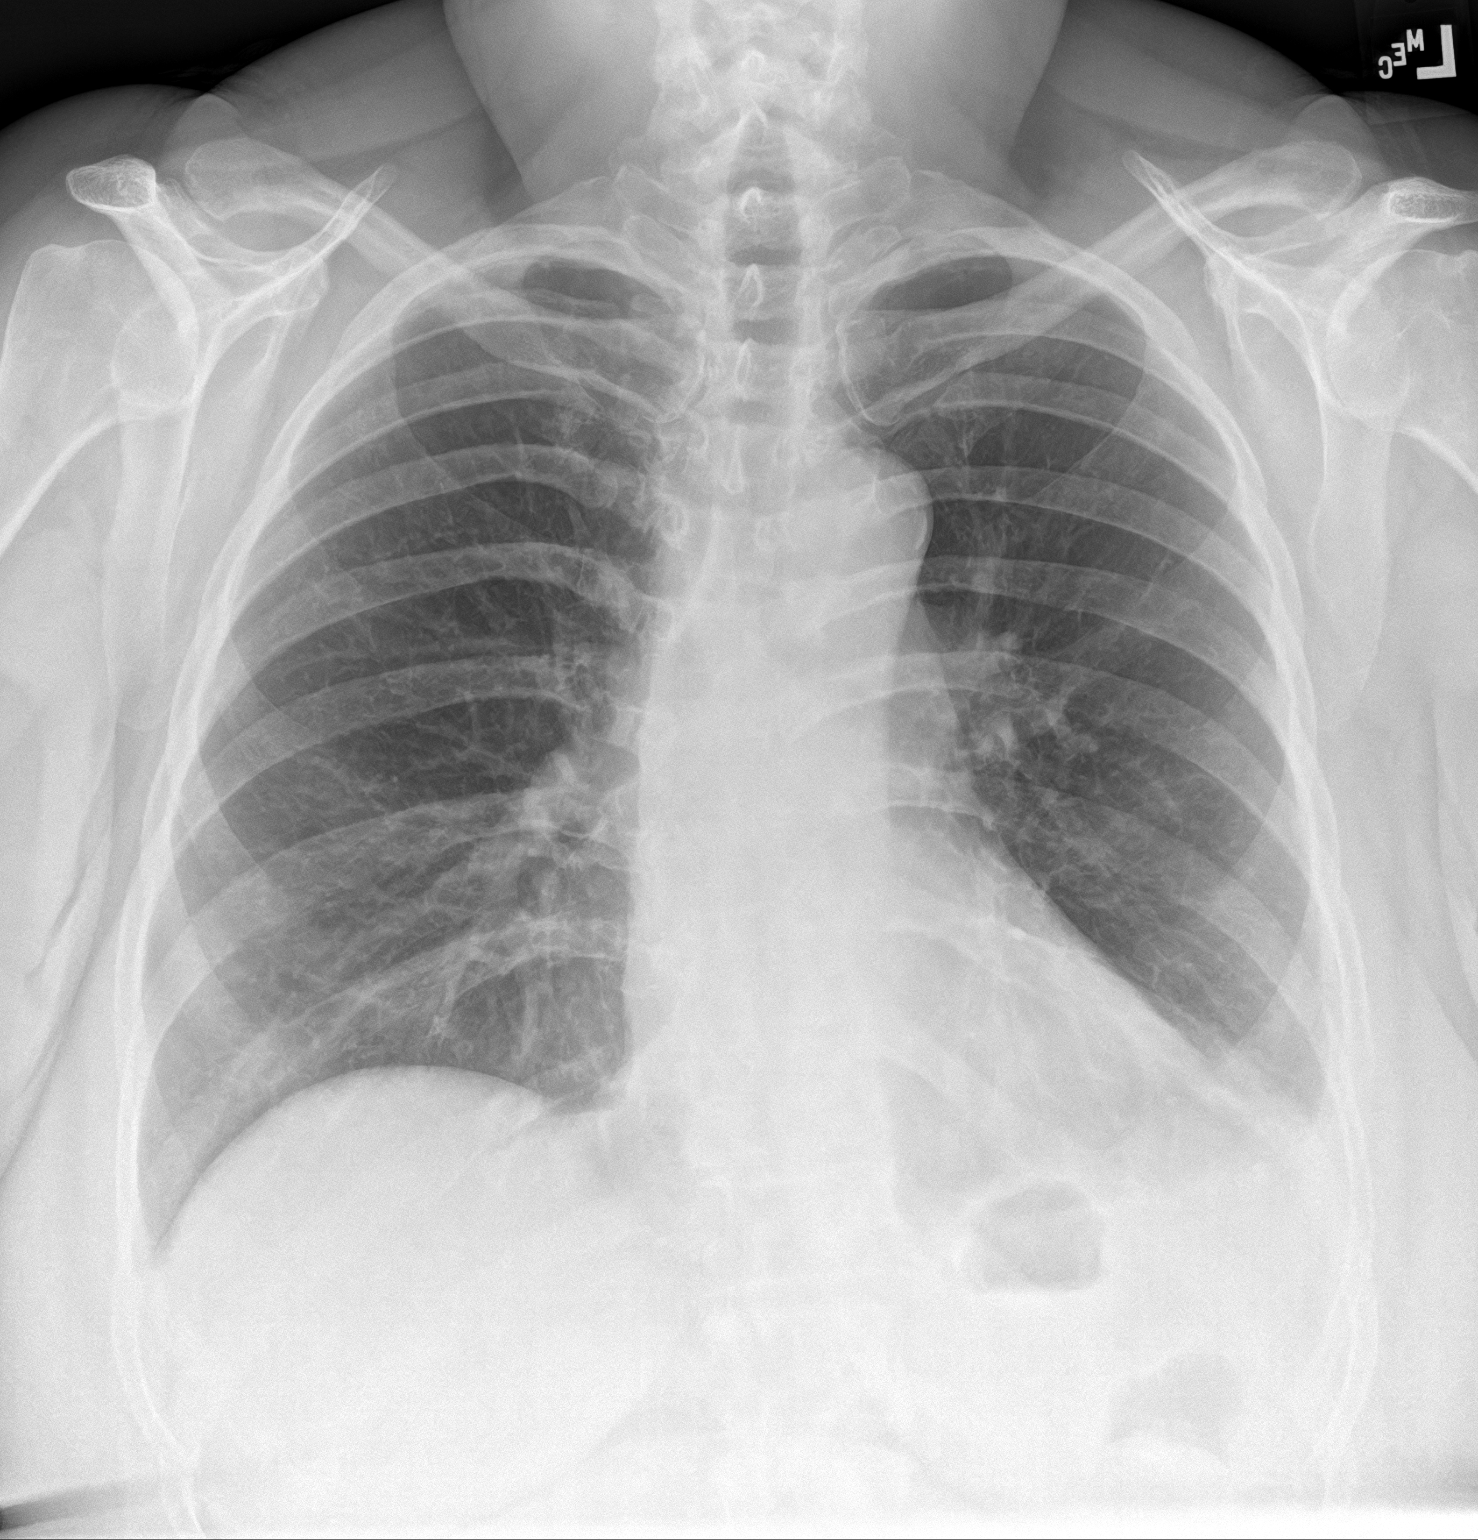
[im 2/2]
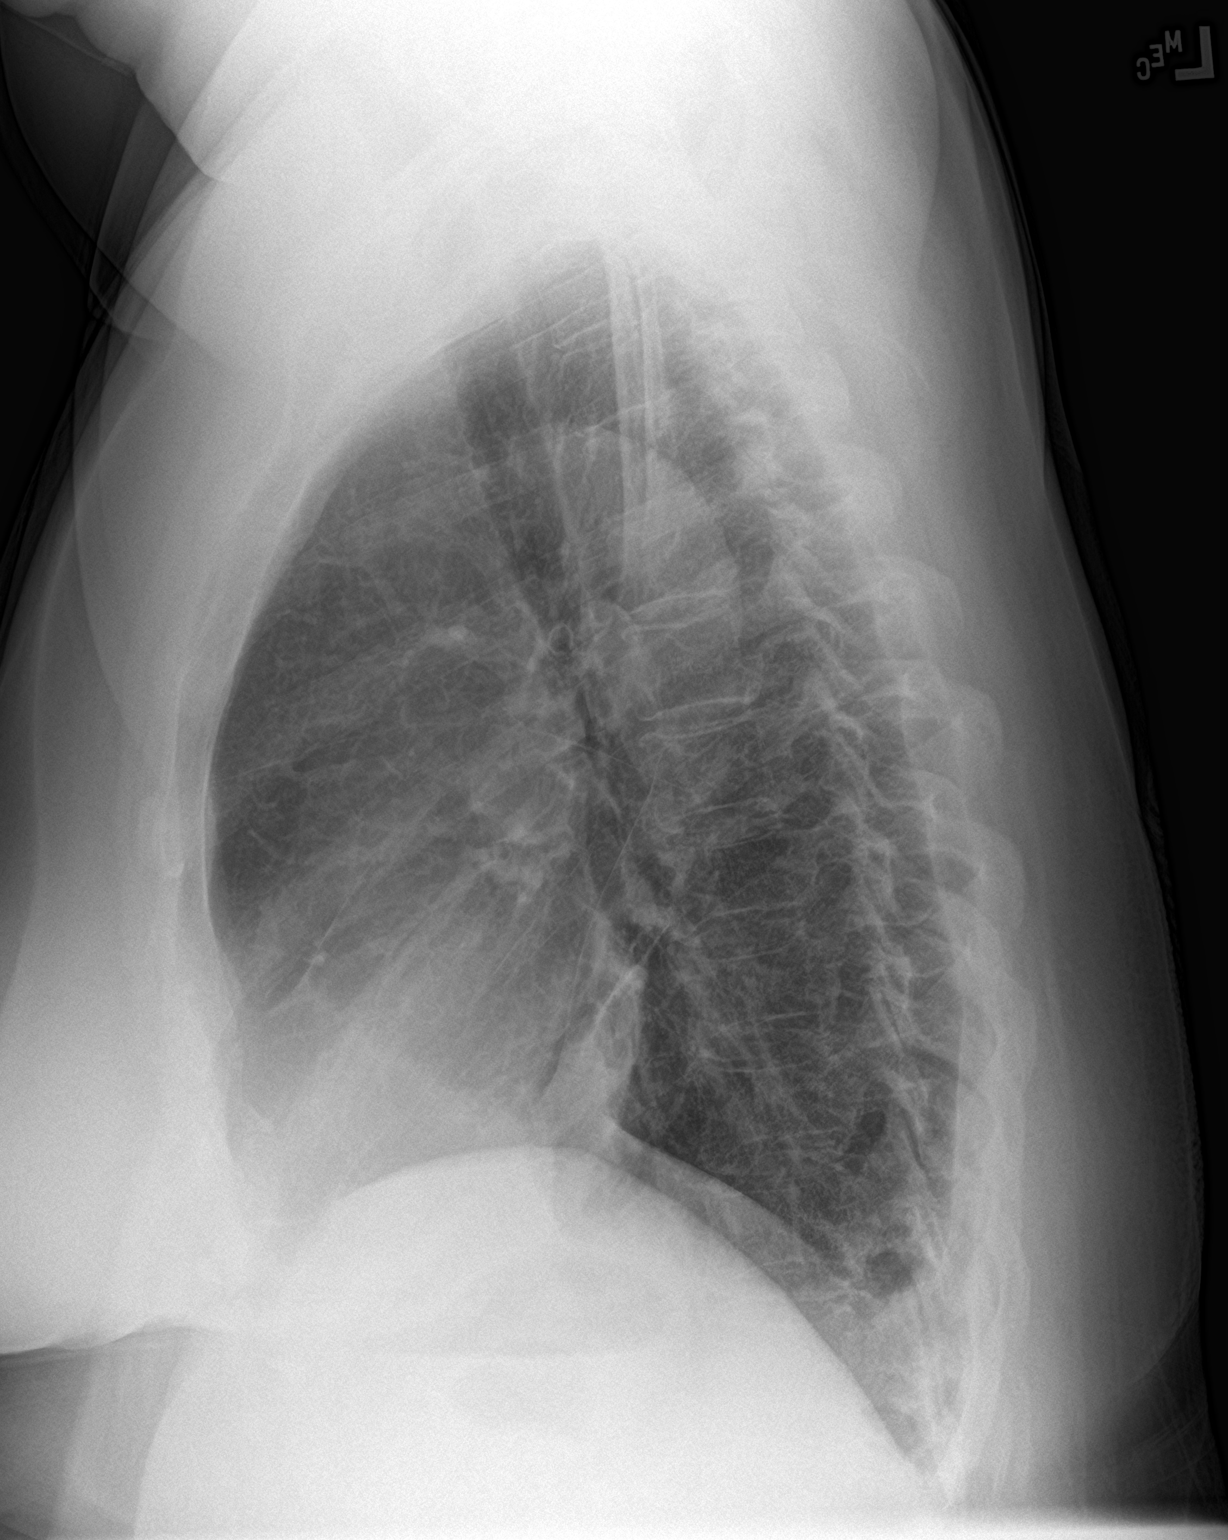

[2 of 2 positions shown; findings below may reference images not displayed]

FINDINGS: Mediastinum hilar structures normal. Heart size stable. Mild left
base atelectasis/infiltrate, improved from prior exam. Small left
pleural effusion, improved from prior exam. Mild right base
infiltrate and tiny right pleural effusion cannot be excluded. No
pneumothorax.
IMPRESSION: 1. Mild left base atelectasis/infiltrate, improved from prior exam.
Small left pleural effusion, improved from prior exam.

2. Mild right base infiltrate tiny right pleural effusion cannot be
excluded on today's exam.

## 2021-03-23 ENCOUNTER — Ambulatory Visit
Admission: EM | Admit: 2021-03-23 | Discharge: 2021-03-23 | Disposition: A | Discharge status: Home / Self Care | Payer: Medicare Other | Attending: Emergency Medicine | Admitting: Emergency Medicine

## 2021-03-23 ENCOUNTER — Ambulatory Visit (INDEPENDENT_AMBULATORY_CARE_PROVIDER_SITE_OTHER): Payer: Medicare Other

## 2021-03-23 ENCOUNTER — Other Ambulatory Visit: Payer: Self-pay

## 2021-03-23 ENCOUNTER — Encounter: Payer: Self-pay | Admitting: Emergency Medicine

## 2021-03-23 DIAGNOSIS — J0101 Acute recurrent maxillary sinusitis: Secondary | ICD-10-CM

## 2021-03-23 DIAGNOSIS — R059 Cough, unspecified: Secondary | ICD-10-CM | POA: Diagnosis not present

## 2021-03-23 MED ORDER — LEVOFLOXACIN 500 MG PO TABS: 500.0000 mg | ORAL_TABLET | Freq: Every day | ORAL | 0 refills | Status: AC

## 2021-03-23 MED ORDER — PREDNISONE 20 MG PO TABS: 40.0000 mg | ORAL_TABLET | Freq: Every day | ORAL | 0 refills | Status: DC

## 2021-03-23 NOTE — ED Triage Notes (Signed)
Patient c/o cough and chest congestion for 2 weeks.  Patient denies fevers.  Patient reports chills.

## 2021-03-23 NOTE — Discharge Instructions (Signed)
Your chest x-ray was negative for infection  However due to the timeline of your illness we will cover for upper respiratory bacteria  Take Levaquin every morning for the next 7 days  Take prednisone every morning with food for the next 5 days  Continue use of over-the-counter medications in addition  Follow-up with your pulmonologist at your upcoming appointment for reevaluation you may follow-up with urgent care if needed prior to

## 2021-03-23 NOTE — ED Provider Notes (Signed)
[facility]    CSN: [id number] Arrival date & time: 03/23/21  3419      History   Chief Complaint Chief Complaint  Patient presents with   Cough    HPI Ann Peters is a 71 y.o. female.   Patient presents with nonproductive cough and chest congestion for 2 weeks.  Endorses that the congestion is present in her left lung causing a throbbing pain.  Cough has began to interfere with sleeping.  Tolerating fluids liquids.  Has attempted use of over-the-counter decongestants and Delsym which have not been helpful.  History of allergy, anxiety, asthma, sinusitis requiring surgical intervention.   Past Medical History:  Diagnosis Date   Allergy    Anemia 02/17/2017   Anxiety    Asthma    well controlled   Cerebral microvascular disease    Depression    Diverticulitis 09/01/2014   History of shingles    ON AND OFF 6 YEARS, recurrent episodes of internal and external shingles with resulting neuralgia   Neuromuscular disorder (HCC)    POSTHERPETIC NEURALGIA, left wrist weakness   Pneumonia    HX   SEVERAL TIMES  NONE IN 3 YRS   Post herpetic neuralgia    Pre-diabetes    Restless leg syndrome    Scoliosis    Shingles    history of.  Recurrent   Sinus infection 01/18/2021   taking levaquin   Sinusitis 10/18/2016   Stroke (HCC)    mild per pt and was unaware of this    Patient Active Problem List   Diagnosis Date Noted   Restless legs syndrome (RLS) 11/21/2020   Keratosis 09/18/2020   Carpal tunnel syndrome on right 03/20/2020   Cerebral microvascular disease 11/10/2019   Pleural effusion, left 10/19/2019   Aortic atherosclerosis (HCC) 10/08/2019   Personal history of colonic polyps    Insomnia 11/05/2016   Osteoarthritis of left hip 10/10/2015   Status post left hip replacement 10/10/2015   Prediabetes 01/03/2015   Anxiety 09/01/2014   Vitamin D deficiency 09/01/2014   Herpes zoster 09/01/2014   Fibromyalgia 11/15/2013   Recurrent major depressive  disorder, in partial remission (HCC) 11/15/2013   Vitamin B12 deficiency 12/23/2011   Allergic rhinitis 04/18/2011    Past Surgical History:  Procedure Laterality Date   BLADDER REPAIR     COLON SURGERY  2011   10 inches removed for benign mass, does not absorb nutrients as well   COLONOSCOPY  2011   polyp removed- Dr Marva Panda   COLONOSCOPY WITH PROPOFOL N/A 07/04/2017   Procedure: COLONOSCOPY WITH PROPOFOL;  Surgeon: Midge Minium, MD;  Location: [facility];  Service: Endoscopy;  Laterality: N/A;  recurrent shingles   GANGLION CYST EXCISION Right 01/24/2021   Procedure: EXCISION OF GANGLION CYST INVOLVING THE FLEXOR TENDON SHEATH OF RIGHT LITTLE FINGER.;  Surgeon: Christena Flake, MD;  Location: [facility];  Service: Orthopedics;  Laterality: Right;   NASAL SINUS SURGERY     TONSILLECTOMY     TOTAL HIP ARTHROPLASTY Left 10/10/2015   Procedure: LEFT TOTAL HIP ARTHROPLASTY ANTERIOR APPROACH;  Surgeon: Kathryne Hitch, MD;  Location: [facility];  Service: Orthopedics;  Laterality: Left;   TRIGGER FINGER RELEASE Left 03/28/2017   Procedure: MIDDLE FINGER DUPREYTRENS RELEASE;  Surgeon: Erin Sons, MD;  Location: [facility];  Service: Orthopedics;  Laterality: Left;   TUBAL LIGATION     TURBINATE REDUCTION Bilateral 04/29/2019   Procedure: TURBINATE REDUCTION WITH Emerson Monte;  Surgeon: Vernie Murders, MD;  Location: [facility];  Service: ENT;  Laterality: Bilateral;  vivear   VAGINAL HYSTERECTOMY     partial    OB History     Gravida  4   Para      Term      Preterm      AB  1   Living         SAB  1   IAB      Ectopic      Multiple      Live Births               Home Medications    Prior to Admission medications   Medication Sig Start Date End Date Taking? Authorizing Provider  albuterol (VENTOLIN HFA) 108 (90 Base) MCG/ACT inhaler Inhale 2 puffs into the lungs every 6 (six) hours as needed for wheezing or shortness of breath. 02/01/21    Salena Saner, MD  atorvastatin (LIPITOR) 10 MG tablet Take 1 tablet (10 mg total) by mouth in the morning. 01/24/21   Poggi, Excell Seltzer, MD  cefdinir (OMNICEF) 300 MG capsule Take 1 capsule (300 mg total) by mouth 2 (two) times daily. 02/06/21   Reubin Milan, MD  Cholecalciferol (VITAMIN D3) 5000 UNITS CAPS Take 1 capsule (5,000 Units total) by mouth daily. 09/01/14   Plonk, Chrissie Noa, MD  DULoxetine (CYMBALTA) 60 MG capsule TAKE 2 CAPSULES BY MOUTH DAILY 02/13/21   Reubin Milan, MD  fluticasone [facility]) 50 MCG/ACT nasal spray Place 2 sprays into both nostrils daily as needed for allergies. 01/24/21   Poggi, Excell Seltzer, MD  Multiple Vitamins-Minerals (MULTIVITAMIN WOMEN 50+) TABS Take 1 tablet by mouth daily.    [provider]  rOPINIRole (REQUIP) 0.25 MG tablet Take 1 tablet (0.25 mg total) by mouth at bedtime as needed (restless legs). 01/24/21   Poggi, Excell Seltzer, MD  sodium chloride (OCEAN) 0.65 % SOLN nasal spray Place 1 spray into both nostrils as needed for congestion.    [provider]  traZODone (DESYREL) 100 MG tablet TAKE 2 TABLETS(200 MG) BY MOUTH AT BEDTIME 10/25/20   Reubin Milan, MD  valACYclovir (VALTREX) 1000 MG tablet Take 1 tablet (1,000 mg total) by mouth daily. 11/21/20   Reubin Milan, MD  vitamin B-12 (CYANOCOBALAMIN) 1000 MCG tablet Take 1,000 mcg by mouth daily.    [provider]    Family History Family History  Problem Relation Age of Onset   Lymphoma Mother    Sarcoidosis Mother    CAD Father    Breast cancer Sister 40   Brain cancer Sister     Social History Social History   Tobacco Use   Smoking status: Never   Smokeless tobacco: Never  Vaping Use   Vaping Use: Never used  Substance Use Topics   Alcohol use: No    Alcohol/week: 0.0 standard drinks   Drug use: No     Allergies   Amoxicillin-pot clavulanate, Beef-derived products, Pegademase bovine, and Penicillins   Review of Systems Review of Systems   Constitutional:  Positive for chills. Negative for activity change, appetite change, diaphoresis, fatigue, fever and unexpected weight change.  HENT:  Negative for congestion, dental problem, drooling, ear discharge, ear pain, facial swelling, hearing loss, mouth sores, nosebleeds, postnasal drip, rhinorrhea, sinus pressure, sinus pain, sneezing, sore throat, tinnitus, trouble swallowing and voice change.   Respiratory:  Positive for cough, shortness of breath and wheezing. Negative for apnea, choking, chest tightness and stridor.  Cardiovascular: Negative.   Gastrointestinal: Negative.   Skin: Negative.   Neurological: Negative.     Physical Exam Triage Vital Signs ED Triage Vitals  Enc Vitals Group     BP 03/23/21 1031 127/70     Pulse Rate 03/23/21 1031 88     Resp 03/23/21 1031 15     Temp 03/23/21 1031 97.6 F (36.4 C)     Temp Source 03/23/21 1031 Oral     SpO2 03/23/21 1031 98 %     Weight 03/23/21 1028 222 lb (100.7 kg)     Height 03/23/21 1028 5\' 8"  (1.727 m)     Head Circumference --      Peak Flow --      Pain Score 03/23/21 1027 3     Pain Loc --      Pain Edu? --      Excl. in GC? --    No data found.  Updated Vital Signs BP 127/70 (BP Location: Right Arm)    Pulse 88    Temp 97.6 F (36.4 C) (Oral)    Resp 15    Ht 5\' 8"  (1.727 m)    Wt 222 lb (100.7 kg)    SpO2 98%    BMI 33.75 kg/m   Visual Acuity Right Eye Distance:   Left Eye Distance:   Bilateral Distance:    Right Eye Near:   Left Eye Near:    Bilateral Near:     Physical Exam Constitutional:      Appearance: Normal appearance.  HENT:     Head: Normocephalic.     Right Ear: Tympanic membrane, ear canal and external ear normal.     Left Ear: Tympanic membrane and ear canal normal.     Nose: Congestion present. No rhinorrhea.     Mouth/Throat:     Mouth: Mucous membranes are moist.     Pharynx: Posterior oropharyngeal erythema present.  Eyes:     Extraocular Movements: Extraocular  movements intact.  Cardiovascular:     Rate and Rhythm: Normal rate and regular rhythm.     Pulses: Normal pulses.     Heart sounds: Normal heart sounds.  Pulmonary:     Effort: Pulmonary effort is normal.     Breath sounds: Normal breath sounds.  Skin:    General: Skin is warm and dry.  Neurological:     Mental Status: She is alert and oriented to person, place, and time. Mental status is at baseline.  Psychiatric:        Mood and Affect: Mood normal.        Behavior: Behavior normal.     UC Treatments / Results  Labs (all labs ordered are listed, but only abnormal results are displayed) Labs Reviewed - No data to display  EKG   Radiology DG Chest 2 View  Result Date: 03/23/2021 CLINICAL DATA:  2 week history of cough and congestion. EXAM: CHEST - 2 VIEW COMPARISON:  10/26/2020 FINDINGS: The heart size and mediastinal contours are within normal limits. Both lungs are clear. The visualized skeletal structures are unremarkable. IMPRESSION: No active cardiopulmonary disease. Electronically Signed   By: Kennith CenterEric  Mansell M.D.   On: 03/23/2021 10:49    Procedures Procedures (including critical care time)  Medications Ordered in UC Medications - No data to display  Initial Impression / Assessment and Plan / UC Course  I have reviewed the triage vital signs and the nursing notes.  Pertinent labs & imaging results that were available  during my care of the patient were reviewed by me and considered in my medical decision making (see chart for details).  Acute recurrent maxillary sinusitis  Chest x-ray negative, discussed findings with patient, recently had sinus infection in December 2022 and current symptoms have persisted for 2 weeks, will move forward with treatment with antibiotic, allergy to penicillin confirmed with patient, patient endorses that she typically has to take Levaquin for treatment of SIBO and infections, Levaquin 7-day course prescribed as well as prednisone 5-day  burst, due to insurance unable to prescribe cough medicine, discussed with patient, advised continued use of current over-the-counter medications, may follow-up with urgent care as needed, has upcoming appointment with pulmonologist, recommended patient to keep this appointment Final Clinical Impressions(s) / UC Diagnoses   Final diagnoses:  None   Discharge Instructions   None    ED Prescriptions   None    PDMP not reviewed this encounter.   Valinda HoarWhite, Timothey Dahlstrom R, NP 03/23/21 1257

## 2021-03-29 ENCOUNTER — Telehealth: Payer: Self-pay

## 2021-03-29 NOTE — Telephone Encounter (Signed)
Pt sent a message in Mychart will respond to Mychart message.  KP

## 2021-03-29 NOTE — Telephone Encounter (Signed)
Copied from CRM [id number]. Topic: General - Other >> Mar 29, 2021  9:26 AM Wyonia Hough E wrote: Reason for CRM: Pt received a letter from radiology that there was an incidental finding in her xray and she needs to contact Dr. Judithann Graves for those results/ please advise

## 2021-04-11 DIAGNOSIS — R0689 Other abnormalities of breathing: Secondary | ICD-10-CM | POA: Diagnosis not present

## 2021-04-16 DIAGNOSIS — H6063 Unspecified chronic otitis externa, bilateral: Secondary | ICD-10-CM | POA: Diagnosis not present

## 2021-04-16 DIAGNOSIS — K219 Gastro-esophageal reflux disease without esophagitis: Secondary | ICD-10-CM | POA: Diagnosis not present

## 2021-04-16 DIAGNOSIS — R0982 Postnasal drip: Secondary | ICD-10-CM | POA: Diagnosis not present

## 2021-04-19 ENCOUNTER — Other Ambulatory Visit: Payer: Self-pay | Admitting: Pulmonary Disease

## 2021-04-19 DIAGNOSIS — R0689 Other abnormalities of breathing: Secondary | ICD-10-CM

## 2021-05-10 DIAGNOSIS — R0689 Other abnormalities of breathing: Secondary | ICD-10-CM | POA: Diagnosis not present

## 2021-05-12 ENCOUNTER — Encounter: Payer: Self-pay | Admitting: Internal Medicine

## 2021-05-12 ENCOUNTER — Other Ambulatory Visit: Payer: Self-pay | Admitting: Internal Medicine

## 2021-05-12 DIAGNOSIS — M797 Fibromyalgia: Secondary | ICD-10-CM

## 2021-05-13 ENCOUNTER — Other Ambulatory Visit: Payer: Self-pay | Admitting: Internal Medicine

## 2021-05-13 DIAGNOSIS — M797 Fibromyalgia: Secondary | ICD-10-CM

## 2021-05-13 MED ORDER — PREGABALIN 75 MG PO CAPS: ORAL_CAPSULE | ORAL | 0 refills | Status: DC

## 2021-05-14 NOTE — Telephone Encounter (Signed)
Requested Prescriptions  ?Pending Prescriptions Disp Refills  ?? DULoxetine (CYMBALTA) 60 MG capsule [Pharmacy Med Name: DULOXETINE DR 60MG CAPSULES] 180 capsule 0  ?  Sig: TAKE 2 CAPSULE BY MOUTH DAILY  ?  ? Psychiatry: Antidepressants - SNRI - duloxetine Failed - 05/12/2021  6:21 AM  ?  ?  Failed - Cr in normal range and within 360 days  ?  Creatinine, Ser  ?Date Value Ref Range Status  ?11/21/2020 1.01 (H) 0.57 - 1.00 mg/dL Final  ?   ?  ?  Passed - eGFR is 30 or above and within 360 days  ?  GFR calc Af Amer  ?Date Value Ref Range Status  ?09/13/2019 54 (L) >60 mL/min Final  ? ?GFR calc non Af Amer  ?Date Value Ref Range Status  ?09/13/2019 46 (L) >60 mL/min Final  ? ?eGFR  ?Date Value Ref Range Status  ?11/21/2020 60 >59 mL/min/1.73 Final  ?   ?  ?  Passed - Completed PHQ-2 or PHQ-9 in the last 360 days  ?  ?  Passed - Last BP in normal range  ?  BP Readings from Last 1 Encounters:  ?03/23/21 127/70  ?   ?  ?  Passed - Valid encounter within last 6 months  ?  Recent Outpatient Visits   ?      ? 3 months ago Acute non-recurrent maxillary sinusitis  ? [facility] Glean Hess, MD  ? 5 months ago Aortic atherosclerosis [facility])  ? [facility] Glean Hess, MD  ? 7 months ago Keratosis  ? [facility] Glean Hess, MD  ? 10 months ago COVID-19 virus infection  ? [facility] Glean Hess, MD  ? 11 months ago Fibromyalgia  ? [facility] Glean Hess, MD  ?  ?  ?Future Appointments   ?        ? In 6 months [facility] Melia Jesse Sans, MD [facility] [place]  ?  ? ?  ?  ?  ? ?

## 2021-05-22 DIAGNOSIS — R899 Unspecified abnormal finding in specimens from other organs, systems and tissues: Secondary | ICD-10-CM | POA: Diagnosis not present

## 2021-05-22 DIAGNOSIS — R1312 Dysphagia, oropharyngeal phase: Secondary | ICD-10-CM | POA: Diagnosis not present

## 2021-05-23 DIAGNOSIS — J4540 Moderate persistent asthma, uncomplicated: Secondary | ICD-10-CM | POA: Diagnosis not present

## 2021-05-29 DIAGNOSIS — Z20822 Contact with and (suspected) exposure to covid-19: Secondary | ICD-10-CM | POA: Diagnosis not present

## 2021-06-06 ENCOUNTER — Other Ambulatory Visit: Payer: Self-pay | Admitting: Internal Medicine

## 2021-06-06 DIAGNOSIS — G4700 Insomnia, unspecified: Secondary | ICD-10-CM

## 2021-06-06 NOTE — Telephone Encounter (Signed)
Requested Prescriptions  ?Pending Prescriptions Disp Refills  ?? traZODone (DESYREL) 100 MG tablet [Pharmacy Med Name: TRAZODONE 100MG  TABLETS] 180 tablet 0  ?  Sig: TAKE 2 TABLETS(200 MG) BY MOUTH AT BEDTIME  ?  ? Psychiatry: Antidepressants - Serotonin Modulator Passed - 06/06/2021  4:14 PM  ?  ?  Passed - Completed PHQ-2 or PHQ-9 in the last 360 days  ?  ?  Passed - Valid encounter within last 6 months  ?  Recent Outpatient Visits   ?      ? 4 months ago Acute non-recurrent maxillary sinusitis  ? [facility], MD  ? 6 months ago Aortic atherosclerosis [facility])  ? [facility], MD  ? 8 months ago Keratosis  ? [facility], MD  ? 10 months ago COVID-19 virus infection  ? [facility], MD  ? 1 year ago Fibromyalgia  ? [facility], MD  ?  ?  ?Future Appointments   ?        ? In 5 months Reubin Milan Judithann Graves, MD [facility]  ?  ? ?  ?  ?  ? ?

## 2021-06-18 DIAGNOSIS — Z20822 Contact with and (suspected) exposure to covid-19: Secondary | ICD-10-CM | POA: Diagnosis not present

## 2021-06-27 ENCOUNTER — Other Ambulatory Visit: Payer: Self-pay | Admitting: Internal Medicine

## 2021-06-27 DIAGNOSIS — G2581 Restless legs syndrome: Secondary | ICD-10-CM

## 2021-06-27 NOTE — Telephone Encounter (Signed)
Requested medications are due for refill today.  unsure ? ?Requested medications are on the active medications list.  yes ? ?Last refill. 01/25/2021 - no amount  ? ?Future visit scheduled.   yes ? ?Notes to clinic.  Last rx was signed by Leron Croak. ? ? ? ?Requested Prescriptions  ?Pending Prescriptions Disp Refills  ? rOPINIRole (REQUIP) 0.25 MG tablet [Pharmacy Med Name: ROPINIROLE 0.25MG  TABLETS] 90 tablet   ?  Sig: TAKE 1 TABLET(0.25 MG) BY MOUTH AT BEDTIME  ?  ? Neurology:  Parkinsonian Agents Passed - 06/27/2021 11:04 AM  ?  ?  Passed - Last BP in normal range  ?  BP Readings from Last 1 Encounters:  ?03/23/21 127/70  ?  ?  ?  ?  Passed - Last Heart Rate in normal range  ?  Pulse Readings from Last 1 Encounters:  ?03/23/21 88  ?  ?  ?  ?  Passed - Valid encounter within last 12 months  ?  Recent Outpatient Visits   ? ?      ? 4 months ago Acute non-recurrent maxillary sinusitis  ? [facility] Reubin Milan, MD  ? 7 months ago Aortic atherosclerosis [facility])  ? [facility] Reubin Milan, MD  ? 9 months ago Keratosis  ? [facility] Reubin Milan, MD  ? 11 months ago COVID-19 virus infection  ? [facility] Reubin Milan, MD  ? 1 year ago Fibromyalgia  ? [facility] Reubin Milan, MD  ? ?  ?  ?Future Appointments   ? ?        ? In 4 months Judithann Graves Nyoka Cowden, MD [facility]  ? ?  ? ? ?  ?  ?  ?  ?

## 2021-07-02 ENCOUNTER — Other Ambulatory Visit: Payer: Self-pay | Admitting: Internal Medicine

## 2021-07-02 DIAGNOSIS — I6789 Other cerebrovascular disease: Secondary | ICD-10-CM

## 2021-07-02 DIAGNOSIS — I700 Atherosclerosis of aorta: Secondary | ICD-10-CM

## 2021-07-02 MED ORDER — ATORVASTATIN CALCIUM 10 MG PO TABS: 10.0000 mg | ORAL_TABLET | Freq: Every morning | ORAL | 1 refills | Status: AC

## 2021-07-04 ENCOUNTER — Telehealth: Payer: Self-pay | Admitting: Internal Medicine

## 2021-07-04 NOTE — Telephone Encounter (Signed)
Medication Refill - Medication:  ?azelastine (OPTIVAR) 0.05 % ophthalmic solution ? ?Has the patient contacted their pharmacy? Yes.   ?Contact PCP ? ?Preferred Pharmacy (with phone number or street name):  ?[id number] - [place], [place] - [address] AT [facility] [address] [facility] Phone:  [phone]  ?Fax:  [phone]  ?  ? ? ?Has the patient been seen for an appointment in the last year OR does the patient have an upcoming appointment? Yes.   ? ?Agent: Please be advised that RX refills may take up to 3 business days. We ask that you follow-up with your pharmacy. ?

## 2021-07-05 NOTE — Telephone Encounter (Signed)
Called pt let her know that Dr. Judithann Graves does not prescribe the medication for her that she got it from [facility]. Pt verbalized understanding.  KP

## 2021-07-07 ENCOUNTER — Ambulatory Visit
Admission: EM | Admit: 2021-07-07 | Discharge: 2021-07-07 | Disposition: A | Discharge status: Home / Self Care | Payer: Medicare Other | Attending: Emergency Medicine | Admitting: Emergency Medicine

## 2021-07-07 ENCOUNTER — Other Ambulatory Visit: Payer: Self-pay

## 2021-07-07 DIAGNOSIS — H1033 Unspecified acute conjunctivitis, bilateral: Secondary | ICD-10-CM | POA: Diagnosis not present

## 2021-07-07 MED ORDER — MOXIFLOXACIN HCL 0.5 % OP SOLN
1.0000 [drp] | Freq: Three times a day (TID) | OPHTHALMIC | 0 refills | Status: AC
Start: 2021-07-07 — End: 2021-07-14

## 2021-07-07 NOTE — ED Triage Notes (Signed)
Patient in today c/o bilateral eye redness, itching and drainage x 2 days. Patient tried OTC Visine without relief.

## 2021-07-07 NOTE — ED Provider Notes (Signed)
[facility]    CSN: [id number] Arrival date & time: 07/07/21  1147      History   Chief Complaint Chief Complaint  Patient presents with   Eye Problem    HPI Ann Peters is a 71 y.o. female.   HPI  71 year old female here for evaluation of eye complaints.  Patient reports that for last 2 days she has been experiencing bilateral eye redness, itching, and drainage.  This morning she woke up her eyes were matted shut and she had white mucousy discharge from both eyes.  She denies any changes in vision and she is not aware of being around anyone else with similar symptoms.  She and her husband just got back from the beach.  She initially thought the irritation was because of the chlorine in the pool as well as salt water in the ocean.  Past Medical History:  Diagnosis Date   Allergy    Anemia 02/17/2017   Anxiety    Asthma    well controlled   Cerebral microvascular disease    Depression    Diverticulitis 09/01/2014   History of shingles    ON AND OFF 6 YEARS, recurrent episodes of internal and external shingles with resulting neuralgia   Neuromuscular disorder (HCC)    POSTHERPETIC NEURALGIA, left wrist weakness   Pneumonia    HX   SEVERAL TIMES  NONE IN 3 YRS   Post herpetic neuralgia    Pre-diabetes    Restless leg syndrome    Scoliosis    Shingles    history of.  Recurrent   Sinus infection 01/18/2021   taking levaquin   Sinusitis 10/18/2016   Stroke (HCC)    mild per pt and was unaware of this    Patient Active Problem List   Diagnosis Date Noted   Restless legs syndrome (RLS) 11/21/2020   Keratosis 09/18/2020   Carpal tunnel syndrome on right 03/20/2020   Cerebral microvascular disease 11/10/2019   Pleural effusion, left 10/19/2019   Aortic atherosclerosis (HCC) 10/08/2019   Personal history of colonic polyps    Insomnia 11/05/2016   Osteoarthritis of left hip 10/10/2015   Status post left hip replacement 10/10/2015   Prediabetes  01/03/2015   Anxiety 09/01/2014   Vitamin D deficiency 09/01/2014   Herpes zoster 09/01/2014   Fibromyalgia 11/15/2013   Recurrent major depressive disorder, in partial remission (HCC) 11/15/2013   Vitamin B12 deficiency 12/23/2011   Allergic rhinitis 04/18/2011    Past Surgical History:  Procedure Laterality Date   BLADDER REPAIR     COLON SURGERY  2011   10 inches removed for benign mass, does not absorb nutrients as well   COLONOSCOPY  2011   polyp removed- Dr Marva PandaSkulskie   COLONOSCOPY WITH PROPOFOL N/A 07/04/2017   Procedure: COLONOSCOPY WITH PROPOFOL;  Surgeon: Midge MiniumWohl, Darren, MD;  [facility];  Service: Endoscopy;  Laterality: N/A;  recurrent shingles   GANGLION CYST EXCISION Right 01/24/2021   Procedure: EXCISION OF GANGLION CYST INVOLVING THE FLEXOR TENDON SHEATH OF RIGHT LITTLE FINGER.;  Surgeon: Christena FlakePoggi, John J, MD;  Location: [facility];  Service: Orthopedics;  Laterality: Right;   NASAL SINUS SURGERY     TONSILLECTOMY     TOTAL HIP ARTHROPLASTY Left 10/10/2015   Procedure: LEFT TOTAL HIP ARTHROPLASTY ANTERIOR APPROACH;  Surgeon: Kathryne Hitchhristopher Y Blackman, MD;  Location: [facility];  Service: Orthopedics;  Laterality: Left;   TRIGGER FINGER RELEASE Left 03/28/2017   Procedure: MIDDLE FINGER DUPREYTRENS RELEASE;  Surgeon: Erin Sons, MD;  Location[facility];  Service: Orthopedics;  Laterality: Left;   TUBAL LIGATION     TURBINATE REDUCTION Bilateral 04/29/2019   Procedure: TURBINATE REDUCTION WITH VIVEAR;  Surgeon: Vernie Murders, MD;  Location: [facility];  Service: ENT;  Laterality: Bilateral;  vivear   VAGINAL HYSTERECTOMY     partial    OB History     Gravida  4   Para      Term      Preterm      AB  1   Living         SAB  1   IAB      Ectopic      Multiple      Live Births               Home Medications    Prior to Admission medications   Medication Sig Start Date End Date Taking? Authorizing Provider  albuterol  (VENTOLIN HFA) 108 (90 Base) MCG/ACT inhaler Inhale 2 puffs into the lungs every 6 (six) hours as needed for wheezing or shortness of breath. 02/01/21  Yes Salena Saner, MD  atorvastatin (LIPITOR) 10 MG tablet Take 1 tablet (10 mg total) by mouth in the morning. 07/02/21  Yes Reubin Milan, MD  Cholecalciferol (VITAMIN D3) 5000 UNITS CAPS Take 1 capsule (5,000 Units total) by mouth daily. 09/01/14  Yes Plonk, Chrissie Noa, MD  DULoxetine (CYMBALTA) 60 MG capsule TAKE 2 CAPSULE BY MOUTH DAILY 05/14/21  Yes Reubin Milan, MD  fluticasone [facility]) 50 MCG/ACT nasal spray Place 2 sprays into both nostrils daily as needed for allergies. 01/24/21  Yes Poggi, Excell Seltzer, MD  moxifloxacin (VIGAMOX) 0.5 % ophthalmic solution Place 1 drop into both eyes 3 (three) times daily for 7 days. 07/07/21 07/14/21 Yes Becky Augusta, NP  Multiple Vitamins-Minerals (MULTIVITAMIN WOMEN 50+) TABS Take 1 tablet by mouth daily.   Yes [provider]  pregabalin (LYRICA) 75 MG capsule One capsule twice a day by mouth 05/13/21  Yes Reubin Milan, MD  rOPINIRole (REQUIP) 0.25 MG tablet Take 1 tablet (0.25 mg total) by mouth at bedtime as needed (restless legs). 01/24/21  Yes Poggi, Excell Seltzer, MD  sodium chloride (OCEAN) 0.65 % SOLN nasal spray Place 1 spray into both nostrils as needed for congestion.   Yes [provider]  traZODone (DESYREL) 100 MG tablet TAKE 2 TABLETS(200 MG) BY MOUTH AT BEDTIME 06/06/21  Yes Reubin Milan, MD  valACYclovir (VALTREX) 1000 MG tablet Take 1 tablet (1,000 mg total) by mouth daily. 11/21/20  Yes Reubin Milan, MD  vitamin B-12 (CYANOCOBALAMIN) 1000 MCG tablet Take 1,000 mcg by mouth daily.   Yes [provider]    Family History Family History  Problem Relation Age of Onset   Lymphoma Mother    Sarcoidosis Mother    CAD Father    Breast cancer Sister 39   Brain cancer Sister     Social History Social History   Tobacco Use   Smoking status: Never    Smokeless tobacco: Never  Vaping Use   Vaping Use: Never used  Substance Use Topics   Alcohol use: No    Alcohol/week: 0.0 standard drinks   Drug use: No     Allergies   Amoxicillin-pot clavulanate, Beef-derived products, Pegademase bovine, and Penicillins   Review of Systems Review of Systems  Eyes:  Positive for discharge, redness and itching. Negative for photophobia and visual disturbance.  Physical Exam Triage Vital Signs ED Triage Vitals  Enc Vitals Group     BP 07/07/21 1200 (!) 114/53     Pulse Rate 07/07/21 1200 86     Resp 07/07/21 1200 16     Temp 07/07/21 1200 97.6 F (36.4 C)     Temp Source 07/07/21 1200 Oral     SpO2 07/07/21 1200 98 %     Weight 07/07/21 1202 210 lb (95.3 kg)     Height 07/07/21 1202 5\' 8"  (1.727 m)     Head Circumference --      Peak Flow --      Pain Score 07/07/21 1201 10     Pain Loc --      Pain Edu? --      Excl. in GC? --    No data found.  Updated Vital Signs BP (!) 114/53 (BP Location: Left Arm)   Pulse 86   Temp 97.6 F (36.4 C) (Oral)   Resp 16   Ht 5\' 8"  (1.727 m)   Wt 210 lb (95.3 kg)   SpO2 98%   BMI 31.93 kg/m   Visual Acuity Right Eye Distance: 20/50 Left Eye Distance: 20/50 Bilateral Distance: 20/40  Right Eye Near:   Left Eye Near:    Bilateral Near:     Physical Exam Vitals and nursing note reviewed.  Constitutional:      Appearance: Normal appearance. She is not ill-appearing.  HENT:     Head: Normocephalic and atraumatic.  Eyes:     General: No scleral icterus.       Right eye: Discharge present.        Left eye: Discharge present.    Extraocular Movements: Extraocular movements intact.     Pupils: Pupils are equal, round, and reactive to light.  Skin:    General: Skin is warm.     Capillary Refill: Capillary refill takes less than 2 seconds.     Findings: No erythema or rash.  Neurological:     General: No focal deficit present.     Mental Status: She is alert and oriented to  person, place, and time.  Psychiatric:        Mood and Affect: Mood normal.        Behavior: Behavior normal.        Thought Content: Thought content normal.        Judgment: Judgment normal.     UC Treatments / Results  Labs (all labs ordered are listed, but only abnormal results are displayed) Labs Reviewed - No data to display  EKG   Radiology No results found.  Procedures Procedures (including critical care time)  Medications Ordered in UC Medications - No data to display  Initial Impression / Assessment and Plan / UC Course  I have reviewed the triage vital signs and the nursing notes.  Pertinent labs & imaging results that were available during my care of the patient were reviewed by me and considered in my medical decision making (see chart for details).  Patient is a nontoxic-appearing 71 year old female here for evaluation of bilateral eye complaints as outlined HPI above.  On physical exam patient's pupils are equal round reactive bilaterally and shows normal red light reflex in both eyes.  Bilateral bulbar and labral conjunctiva are erythematous and injected and there is white mucopurulent discharge in the inner canthus of both eyes.  Upper and lower eyelids are unremarkable and they are free of edema, erythema, or induration.  EOMs  intact.  Pupils are equal round and reactive.  Patient's exam is consistent with conjunctivitis.  I will treat her with Vigamox 3 times daily x7 days.  Return precautions reviewed   Final Clinical Impressions(s) / UC Diagnoses   Final diagnoses:  Acute bacterial conjunctivitis of both eyes     Discharge Instructions      Instill 1 drop of Vigamox in each eye every 8 hours for the next 7 days for treatment of your conjunctivitis.  Avoid touching your eyes as much as possible.  Wipe down all surfaces, countertops, and doorknobs after the first and second 24 hours on eyedrops.  Wash your face with a clean wash rag to remove any  drainage and use a different portion of the wash rag to clean each eye so as to not reinfect yourself.  Return for reevaluation for any new or worsening symptoms.      ED Prescriptions     Medication Sig Dispense Auth. Provider   moxifloxacin (VIGAMOX) 0.5 % ophthalmic solution Place 1 drop into both eyes 3 (three) times daily for 7 days. 3 mL Becky Augusta, NP      PDMP not reviewed this encounter.   Becky Augusta, NP 07/07/21 1217

## 2021-07-07 NOTE — Discharge Instructions (Signed)
Instill 1 drop of Vigamox in each eye every 8 hours for the next 7 days for treatment of your conjunctivitis. ° °Avoid touching your eyes as much as possible. ° °Wipe down all surfaces, countertops, and doorknobs after the first and second 24 hours on eyedrops. ° °Wash your face with a clean wash rag to remove any drainage and use a different portion of the wash rag to clean each eye so as to not reinfect yourself. ° °Return for reevaluation for any new or worsening symptoms.  °

## 2021-07-09 ENCOUNTER — Telehealth: Payer: Self-pay | Admitting: Emergency Medicine

## 2021-07-09 MED ORDER — POLYMYXIN B-TRIMETHOPRIM 10000-0.1 UNIT/ML-% OP SOLN: 1.0000 [drp] | OPHTHALMIC | 0 refills | Status: DC

## 2021-07-09 NOTE — Telephone Encounter (Signed)
Was evaluated 2 days ago for pinkeye, prescribed moxifloxacin, Dors is no improvement in symptoms, will trial Polytrim, if no improvement seen after use of this medication, patient notified that she will need to be reevaluated in person, will not prescribe any form of steroids for current diagnosis of conjunctivitis

## 2021-07-16 IMAGING — CR DG CHEST 2V
2 series · 2 of 2 positions shown · non-contrast
Comparison: 11/30/2019

CLINICAL DATA: Pt is here with throat pain and bilateral eye
irritation that started 02/07/2020, pt has taken eye drop to relieve
discomfort

EXAM:
CHEST - 2 VIEW

[chest pa]
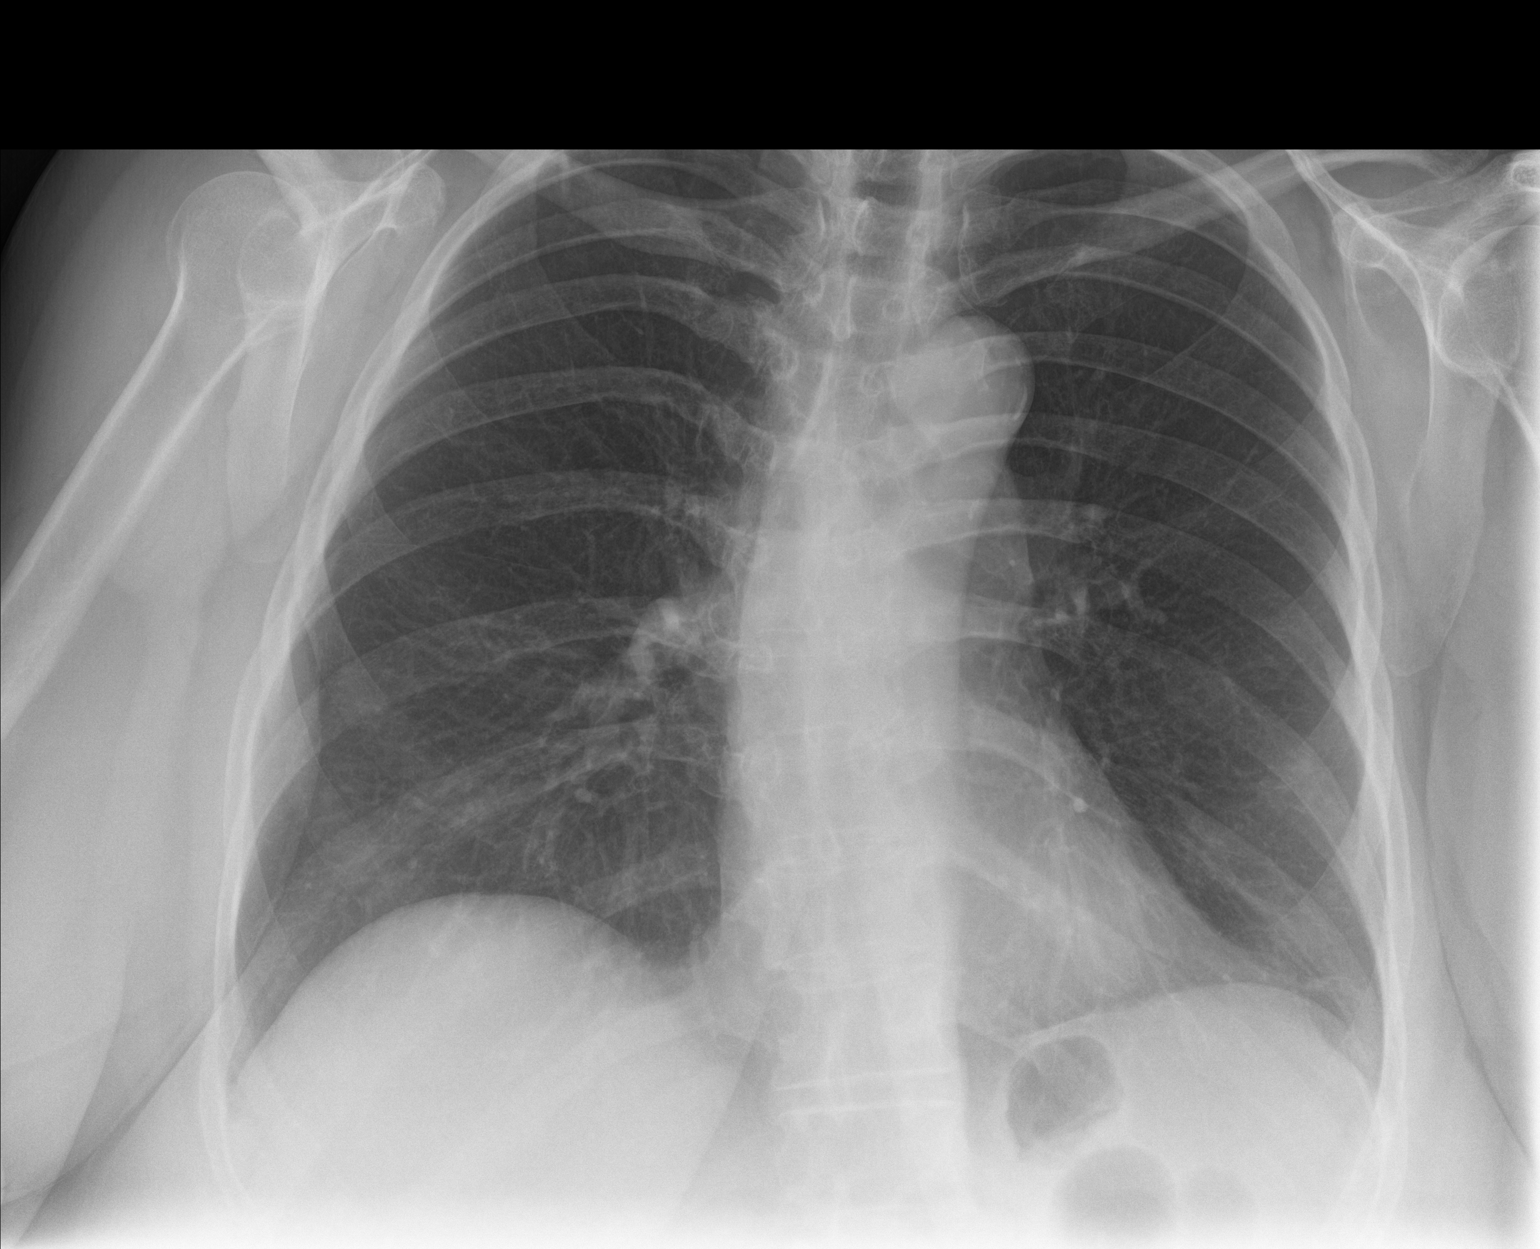

[chest lat]
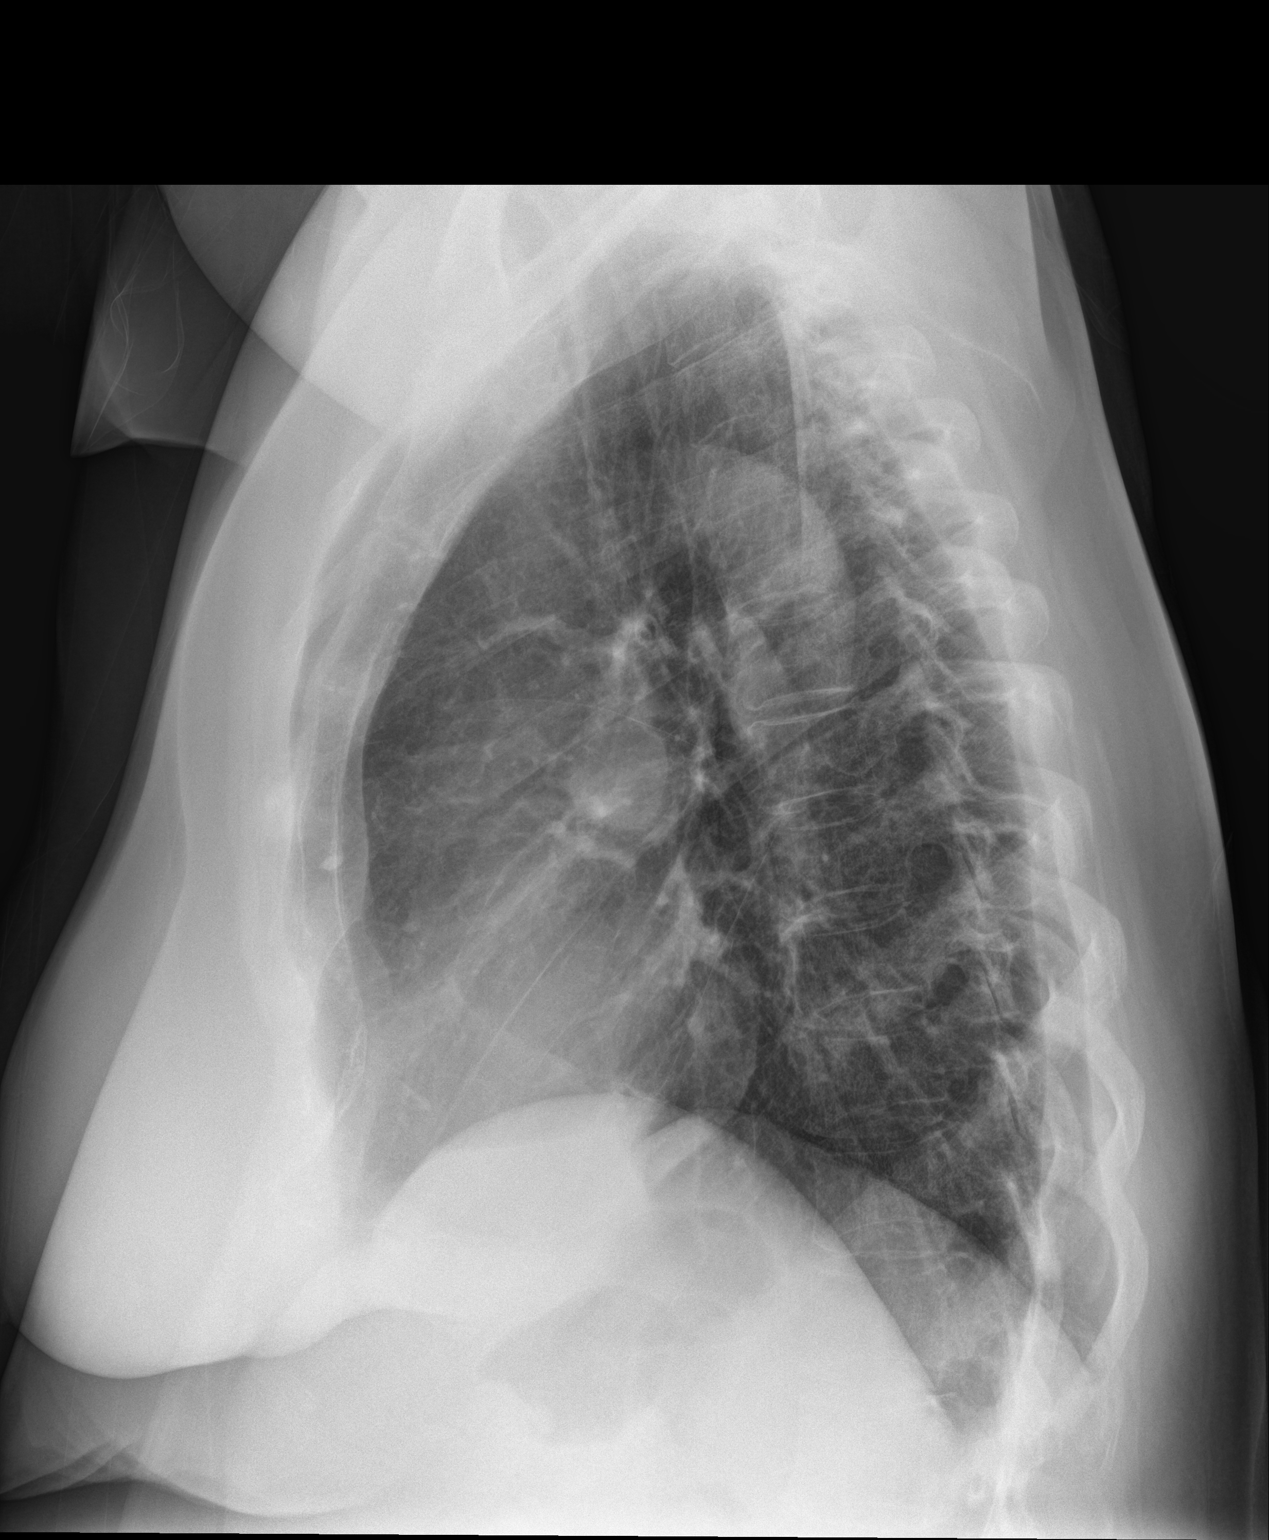

[2 of 2 positions shown; findings below may reference images not displayed]

FINDINGS: The heart size and mediastinal contours are within normal limits.
Both lungs are clear.

No pleural effusion or pneumothorax.

The visualized skeletal structures are intact.
IMPRESSION: No active cardiopulmonary disease.

## 2021-08-10 ENCOUNTER — Other Ambulatory Visit: Payer: Self-pay | Admitting: Internal Medicine

## 2021-08-10 DIAGNOSIS — M797 Fibromyalgia: Secondary | ICD-10-CM

## 2021-08-16 DIAGNOSIS — J849 Interstitial pulmonary disease, unspecified: Secondary | ICD-10-CM | POA: Diagnosis not present

## 2021-08-16 DIAGNOSIS — J4540 Moderate persistent asthma, uncomplicated: Secondary | ICD-10-CM | POA: Diagnosis not present

## 2021-08-16 DIAGNOSIS — H2513 Age-related nuclear cataract, bilateral: Secondary | ICD-10-CM | POA: Diagnosis not present

## 2021-08-23 DIAGNOSIS — R0609 Other forms of dyspnea: Secondary | ICD-10-CM | POA: Diagnosis not present

## 2021-08-23 DIAGNOSIS — J4540 Moderate persistent asthma, uncomplicated: Secondary | ICD-10-CM | POA: Diagnosis not present

## 2021-08-23 DIAGNOSIS — J849 Interstitial pulmonary disease, unspecified: Secondary | ICD-10-CM | POA: Diagnosis not present

## 2021-08-29 ENCOUNTER — Other Ambulatory Visit: Payer: Self-pay | Admitting: Internal Medicine

## 2021-08-29 DIAGNOSIS — M797 Fibromyalgia: Secondary | ICD-10-CM

## 2021-08-29 NOTE — Telephone Encounter (Signed)
Requested medication (s) are due for refill today: yes  Requested medication (s) are on the active medication list: yes    Last refill: 05/13/21  #60  0 refill  Future visit scheduled yes 11/22/21  Notes to clinic:Not delegated, please review. Thank you.  Requested Prescriptions  Pending Prescriptions Disp Refills   pregabalin (LYRICA) 75 MG capsule 60 capsule 0    Sig: One capsule twice a day by mouth     Not Delegated - Neurology:  Anticonvulsants - Controlled - pregabalin Failed - 08/29/2021  1:45 PM      Failed - This refill cannot be delegated      Failed - Cr in normal range and within 360 days    Creatinine, Ser  Date Value Ref Range Status  11/21/2020 1.01 (H) 0.57 - 1.00 mg/dL Final         Passed - Completed PHQ-2 or PHQ-9 in the last 360 days      Passed - Valid encounter within last 12 months    Recent Outpatient Visits           6 months ago Acute non-recurrent maxillary sinusitis   [facility] Reubin Milan, MD   9 months ago Aortic atherosclerosis [facility])   [facility] Reubin Milan, MD   11 months ago [facility] Reubin Milan, MD   1 year ago COVID-19 virus infection   [facility] Reubin Milan, MD   1 year ago Fibromyalgia   [facility] Reubin Milan, MD       Future Appointments             In 2 months Judithann Graves Nyoka Cowden, MD [facility]

## 2021-08-29 NOTE — Telephone Encounter (Signed)
Medication: pregabalin (LYRICA) 75 MG capsule    Pharmacy:  [facility] [address], [place] - [address] Phone:  [phone]  Fax:  [phone]      Patient would like a refill sent to [facility] pharmacy because they are much cheaper.

## 2021-08-30 MED ORDER — PREGABALIN 75 MG PO CAPS: ORAL_CAPSULE | ORAL | 2 refills | Status: AC

## 2021-08-31 ENCOUNTER — Telehealth: Payer: Self-pay | Admitting: Internal Medicine

## 2021-08-31 DIAGNOSIS — G4700 Insomnia, unspecified: Secondary | ICD-10-CM

## 2021-08-31 NOTE — Telephone Encounter (Signed)
Duplicate

## 2021-08-31 NOTE — Telephone Encounter (Signed)
    Requested Prescriptions  Pending Prescriptions Disp Refills   traZODone (DESYREL) 100 MG tablet [Pharmacy Med Name: TRAZODONE 100MG  TABLETS] 180 tablet 0    Sig: TAKE 2 TABLETS(200 MG) BY MOUTH AT BEDTIME     Psychiatry: Antidepressants - Serotonin Modulator Failed - 08/31/2021 12:58 PM      Failed - Valid encounter within last 6 months    Recent Outpatient Visits           6 months ago Acute non-recurrent maxillary sinusitis   [facility] 09/02/2021, MD   9 months ago Aortic atherosclerosis [facility])   [facility], MD   11 months ago [facility], MD   1 year ago COVID-19 virus infection   [facility], MD   1 year ago Fibromyalgia   [facility], MD       Future Appointments             In 2 months Reubin Milan Judithann Graves, MD [facility]            Passed - Completed PHQ-2 or PHQ-9 in the last 360 days

## 2021-08-31 NOTE — Telephone Encounter (Signed)
Requested medication (s) are due for refill today: yes  Requested medication (s) are on the active medication list: yes  Last refill:  06/06/21 #180 with 0 RF  Future visit scheduled: 11/22/21, last seen 02/06/2021  Notes to clinic:  Pt not seen within 6 months but does have an appt schediuld but not until October, please assess.      Requested Prescriptions  Pending Prescriptions Disp Refills   traZODone (DESYREL) 100 MG tablet [Pharmacy Med Name: TRAZODONE 100MG  TABLETS] 180 tablet 0    Sig: TAKE 2 TABLETS(200 MG) BY MOUTH AT BEDTIME     Psychiatry: Antidepressants - Serotonin Modulator Failed - 08/31/2021  1:06 PM      Failed - Valid encounter within last 6 months    Recent Outpatient Visits           6 months ago Acute non-recurrent maxillary sinusitis   [facility] 09/02/2021, MD   9 months ago Aortic atherosclerosis [facility])   [facility], MD   11 months ago [facility], MD   1 year ago COVID-19 virus infection   [facility], MD   1 year ago Fibromyalgia   [facility], MD       Future Appointments             In 2 months Reubin Milan Judithann Graves, MD [facility]            Passed - Completed PHQ-2 or PHQ-9 in the last 360 days

## 2021-09-04 ENCOUNTER — Other Ambulatory Visit: Payer: Self-pay | Admitting: Internal Medicine

## 2021-09-04 ENCOUNTER — Other Ambulatory Visit: Payer: Self-pay

## 2021-09-04 DIAGNOSIS — G4700 Insomnia, unspecified: Secondary | ICD-10-CM

## 2021-09-04 MED ORDER — TRAZODONE HCL 100 MG PO TABS: ORAL_TABLET | ORAL | 3 refills | Status: AC

## 2021-09-04 MED ORDER — TRAZODONE HCL 100 MG PO TABS: ORAL_TABLET | ORAL | 0 refills | Status: DC

## 2021-09-04 NOTE — Telephone Encounter (Signed)
Pt called to request a refill, she says she has enough to last her for a month or two but mentions that her PCP never lets her run out.

## 2021-09-10 DIAGNOSIS — M797 Fibromyalgia: Secondary | ICD-10-CM | POA: Diagnosis not present

## 2021-09-10 DIAGNOSIS — G629 Polyneuropathy, unspecified: Secondary | ICD-10-CM | POA: Diagnosis not present

## 2021-09-10 DIAGNOSIS — F419 Anxiety disorder, unspecified: Secondary | ICD-10-CM | POA: Diagnosis not present

## 2021-09-10 DIAGNOSIS — Z1211 Encounter for screening for malignant neoplasm of colon: Secondary | ICD-10-CM | POA: Diagnosis not present

## 2021-09-10 DIAGNOSIS — Z8709 Personal history of other diseases of the respiratory system: Secondary | ICD-10-CM | POA: Diagnosis not present

## 2021-09-10 DIAGNOSIS — J4540 Moderate persistent asthma, uncomplicated: Secondary | ICD-10-CM | POA: Diagnosis not present

## 2021-09-10 DIAGNOSIS — E782 Mixed hyperlipidemia: Secondary | ICD-10-CM | POA: Diagnosis not present

## 2021-09-10 DIAGNOSIS — F3341 Major depressive disorder, recurrent, in partial remission: Secondary | ICD-10-CM | POA: Diagnosis not present

## 2021-09-10 DIAGNOSIS — G2581 Restless legs syndrome: Secondary | ICD-10-CM | POA: Diagnosis not present

## 2021-09-26 DIAGNOSIS — M3501 Sicca syndrome with keratoconjunctivitis: Secondary | ICD-10-CM | POA: Diagnosis not present

## 2021-09-28 IMAGING — MR MR LUMBAR SPINE W/O CM
5 series · 31 of 48 positions shown · non-contrast
Comparison: Radiographs March 23, 2020

CLINICAL DATA: Low back pain.

EXAM:
MRI LUMBAR SPINE WITHOUT CONTRAST
TECHNIQUE: Multiplanar, multisequence MR imaging of the lumbar spine was
performed. No intravenous contrast was administered.

[Series 5: T2 · sagittal · 4.0mm · 0.81mm/px · 7 of 17 slices shown (1 of 2)]
[im 1/17]
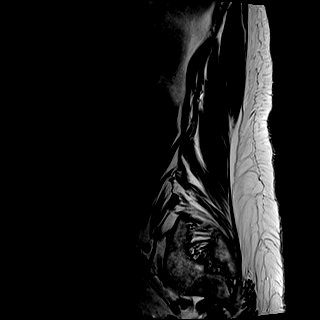
[im 3/17]
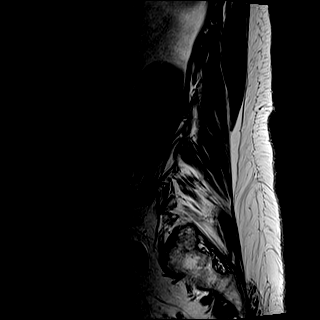
[im 6/17]
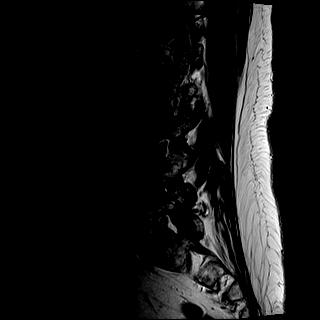
[im 9/17]
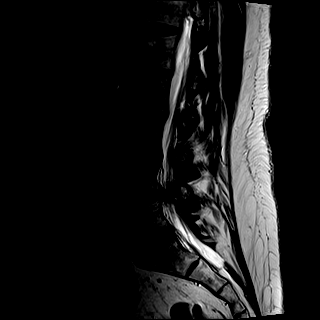
[im 11/17]
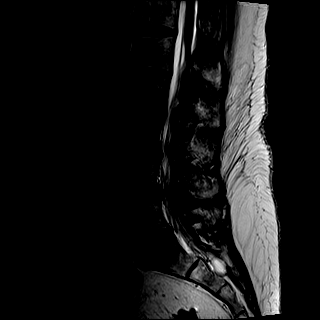
[im 14/17]
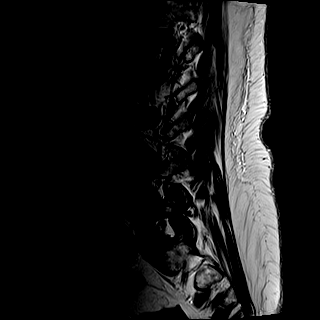
[im 17/17]
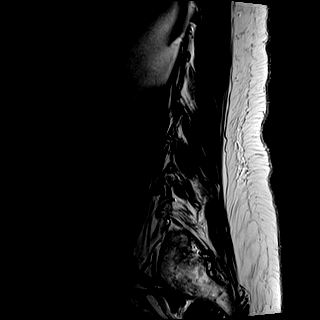

[Series 6: T1 · sagittal · 4.0mm · 0.81mm/px · 7 of 17 slices shown (1 of 2)]
[im 1/17]
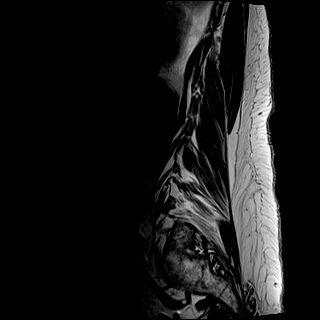
[im 3/17]
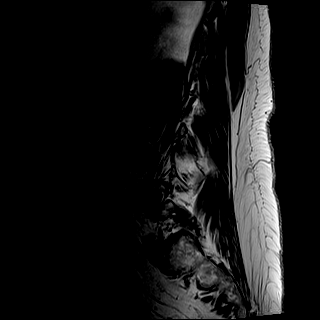
[im 6/17]
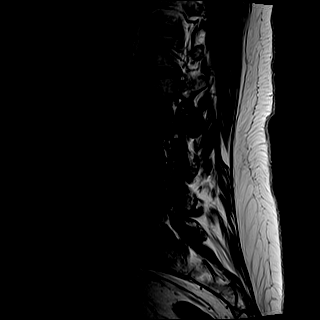
[im 9/17]
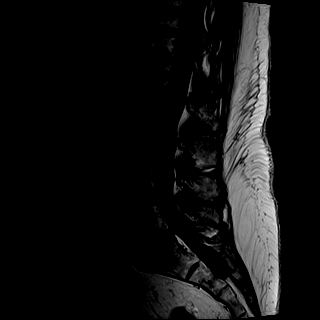
[im 11/17]
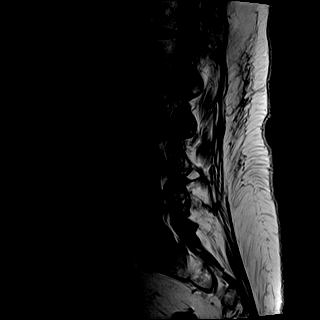
[im 14/17]
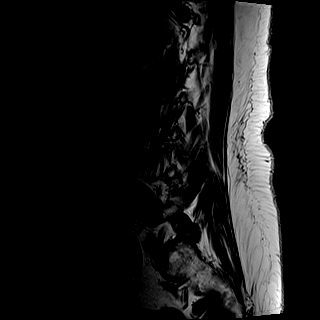
[im 17/17]
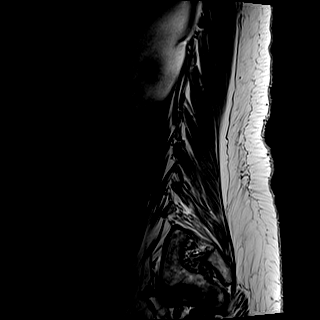

[Series 7: STIR · sagittal · 4.0mm · 0.41mm/px · 1 of 17 slices shown]
[im 1/17]
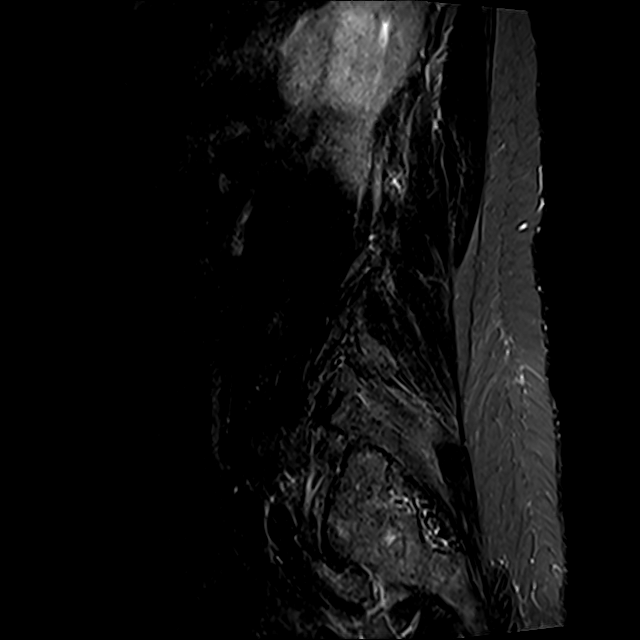

[Series 8: T2 · axial · 4.0mm · 0.78mm/px · z∈[-127,+99]mm · 8 of 38 slices shown (2 of 2)]
[im 1/38]
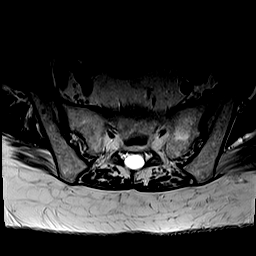
[im 6/38]
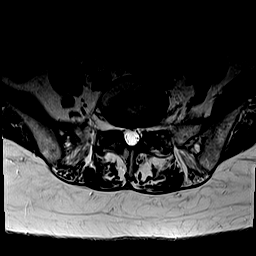
[im 12/38]
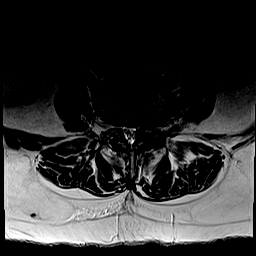
[im 18/38]
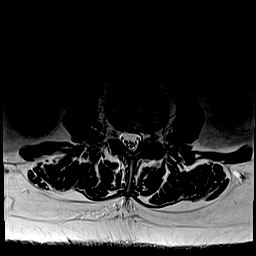
[im 20/38]
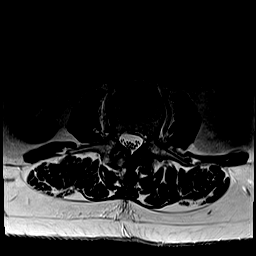
[im 26/38]
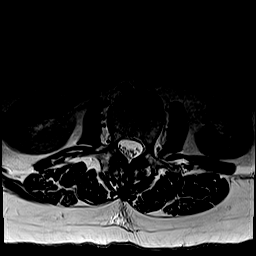
[im 32/38]
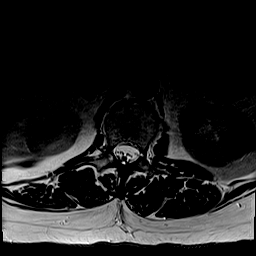
[im 38/38]
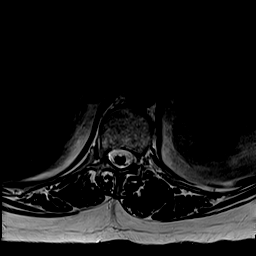

[Series 9: T1 · axial · 4.0mm · 0.39mm/px · z∈[-127,+99]mm · 8 of 38 slices shown (2 of 2)]
[im 1/38]
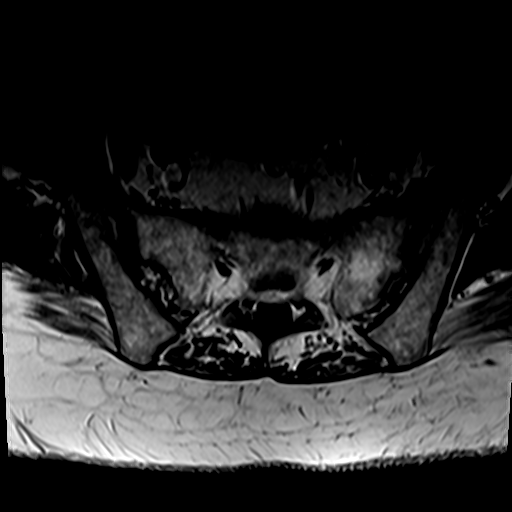
[im 6/38]
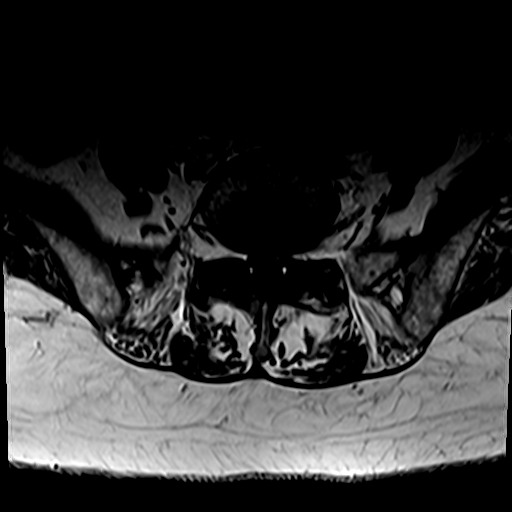
[im 12/38]
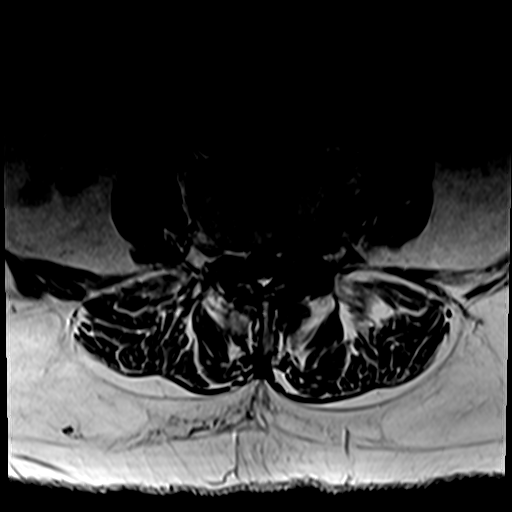
[im 18/38]
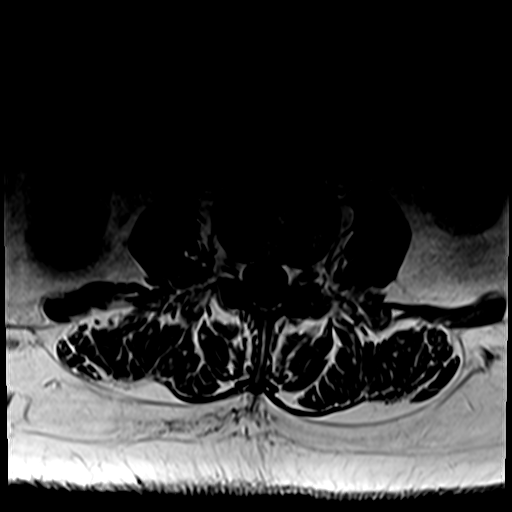
[im 20/38]
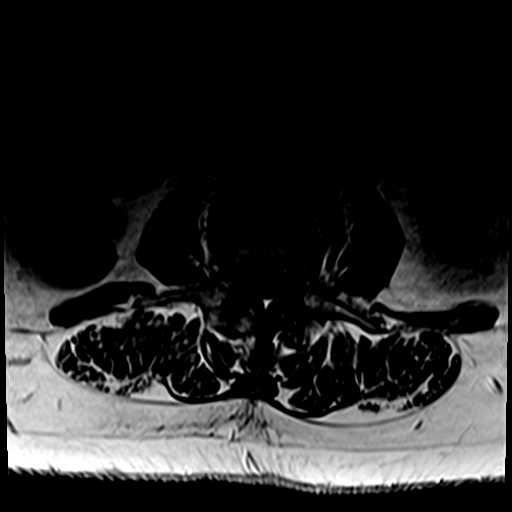
[im 26/38]
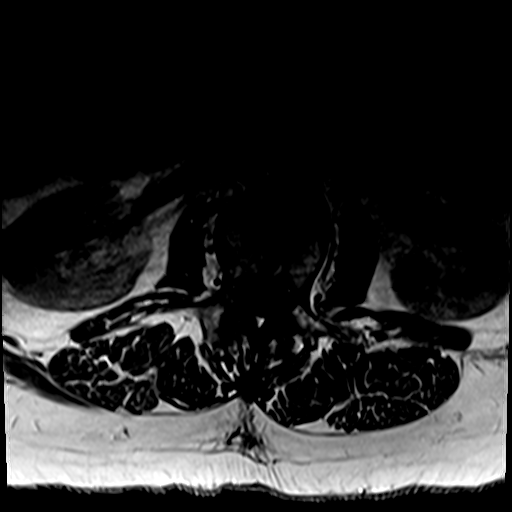
[im 32/38]
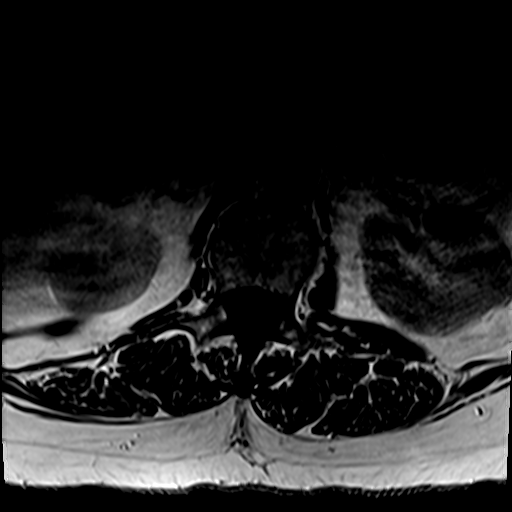
[im 38/38]
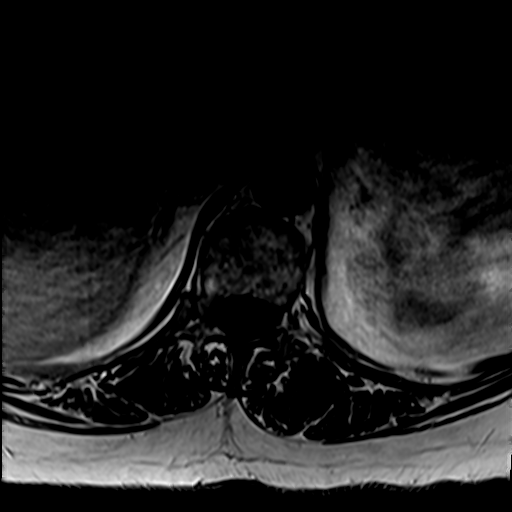

[31 of 48 positions shown; findings below may reference images not displayed]

FINDINGS: Segmentation:  Standard.

Alignment: Levoconvex scoliosis of the lumbar spine. Small
retrolisthesis of L2 over L3.

Vertebrae:  No fracture, evidence of discitis, or bone lesion.

Conus medullaris and cauda equina: Conus extends to the L1 level.
Conus and cauda equina appear normal.

Paraspinal and other soft tissues: Negative.

Disc levels:

T12-L1: No spinal canal or neural foraminal stenosis.

L1-2: Small left foraminal disc protrusion and mild facet
degenerative changes resulting in mild left neural foraminal
narrowing. No spinal canal stenosis.

L2-3: Disc bulge and mild facet degenerative changes, left greater
than right, resulting mild narrowing of the left subarticular zone
mild bilateral neural foraminal narrowing. No significant spinal
canal stenosis.

L3-4: Disc bulge and moderate facet degenerative changes resulting
mild spinal canal stenosis with narrowing of the bilateral
subarticular zones and mild left neural foraminal narrowing

L4-5: Disc bulge, advanced facet degenerative change ligamentum
flavum redundancy resulting in severe spinal canal stenosis and
severe left neural foraminal narrowing.

L5-S1: Moderate facet degenerative changes. No significant spinal
canal or neural foraminal stenosis.
IMPRESSION: 1. Multilevel degenerative changes of the lumbar spine as described
above, worst at L4-5 where there is severe spinal canal stenosis and
severe left neural foraminal narrowing.
2. Mild spinal canal stenosis at L3-4.
3. Mild left neural foraminal narrowing at L1-2, L2-3 and L3-4.

## 2021-10-29 DIAGNOSIS — M3501 Sicca syndrome with keratoconjunctivitis: Secondary | ICD-10-CM | POA: Diagnosis not present

## 2021-10-30 DIAGNOSIS — J4540 Moderate persistent asthma, uncomplicated: Secondary | ICD-10-CM | POA: Diagnosis not present

## 2021-11-20 ENCOUNTER — Ambulatory Visit
Admission: EM | Admit: 2021-11-20 | Discharge: 2021-11-20 | Disposition: A | Discharge status: Home / Self Care | Payer: Medicare Other | Attending: Family | Admitting: Family

## 2021-11-20 DIAGNOSIS — H60391 Other infective otitis externa, right ear: Secondary | ICD-10-CM

## 2021-11-20 DIAGNOSIS — H66004 Acute suppurative otitis media without spontaneous rupture of ear drum, recurrent, right ear: Secondary | ICD-10-CM

## 2021-11-20 DIAGNOSIS — H9201 Otalgia, right ear: Secondary | ICD-10-CM | POA: Diagnosis not present

## 2021-11-20 MED ORDER — LEVOFLOXACIN 500 MG PO TABS: 500.0000 mg | ORAL_TABLET | Freq: Every day | ORAL | 0 refills | Status: AC

## 2021-11-20 MED ORDER — PREDNISONE 20 MG PO TABS: 20.0000 mg | ORAL_TABLET | Freq: Every day | ORAL | 0 refills | Status: AC

## 2021-11-20 NOTE — ED Triage Notes (Signed)
Patient presents to [facility] for right ear pain since Friday. Treating with a vinegar/alcohol mixture with no improvement. She thinks she got water in her ear.   Denies fever or drainage.

## 2021-11-20 NOTE — Discharge Instructions (Addendum)
Recommend start Levaquin 500mg  once daily for 7 days. Do not put any liquids in your right ear. May take oral Prednisone 20mg  once daily for 5 days to help with inflammation and pain. Follow-up with Dr. Daisy Blossom, ENT if ear pain does not improve within 2 to 3 days.

## 2021-11-20 NOTE — ED Provider Notes (Signed)
[facility]    CSN: [id number] Arrival date & time: 11/20/21  1843      History   Chief Complaint Chief Complaint  Patient presents with   Otalgia    HPI Ann Peters is a 71 y.o. female.   71 year old female presents with right ear pain  The history is provided by the patient.  Otalgia   Past Medical History:  Diagnosis Date   Allergy    Anemia 02/17/2017   Anxiety    Asthma    well controlled   Cerebral microvascular disease    Depression    Diverticulitis 09/01/2014   History of shingles    ON AND OFF 6 YEARS, recurrent episodes of internal and external shingles with resulting neuralgia   Neuromuscular disorder (HCC)    POSTHERPETIC NEURALGIA, left wrist weakness   Pneumonia    HX   SEVERAL TIMES  NONE IN 3 YRS   Post herpetic neuralgia    Pre-diabetes    Restless leg syndrome    Scoliosis    Shingles    history of.  Recurrent   Sinus infection 01/18/2021   taking levaquin   Sinusitis 10/18/2016   Stroke ([place])    mild per pt and was unaware of this    Patient Active Problem List   Diagnosis Date Noted   Restless legs syndrome (RLS) 11/21/2020   Keratosis 09/18/2020   Carpal tunnel syndrome on right 03/20/2020   Cerebral microvascular disease 11/10/2019   Pleural effusion, left 10/19/2019   Aortic atherosclerosis ([place]) 10/08/2019   Personal history of colonic polyps    Insomnia 11/05/2016   Osteoarthritis of left hip 10/10/2015   Status post left hip replacement 10/10/2015   Prediabetes 01/03/2015   Anxiety 09/01/2014   Vitamin D deficiency 09/01/2014   Herpes zoster 09/01/2014   Fibromyalgia 11/15/2013   Recurrent major depressive disorder, in partial remission ([place]) 11/15/2013   Vitamin B12 deficiency 12/23/2011   Allergic rhinitis 04/18/2011    Past Surgical History:  Procedure Laterality Date   BLADDER REPAIR     COLON SURGERY  2011   10 inches removed for benign mass, does not absorb nutrients as well   COLONOSCOPY   2011   polyp removed- Dr Gustavo Lah   COLONOSCOPY WITH PROPOFOL N/A 07/04/2017   Procedure: COLONOSCOPY WITH PROPOFOL;  Surgeon: Lucilla Lame, MD;  Location: [place];  Service: Endoscopy;  Laterality: N/A;  recurrent shingles   GANGLION CYST EXCISION Right 01/24/2021   Procedure: EXCISION OF GANGLION CYST INVOLVING THE FLEXOR TENDON SHEATH OF RIGHT LITTLE FINGER.;  Surgeon: Corky Mull, MD;  Location: [facility];  Service: Orthopedics;  Laterality: Right;   NASAL SINUS SURGERY     TONSILLECTOMY     TOTAL HIP ARTHROPLASTY Left 10/10/2015   Procedure: LEFT TOTAL HIP ARTHROPLASTY ANTERIOR APPROACH;  Surgeon: Mcarthur Rossetti, MD;  Location: [place];  Service: Orthopedics;  Laterality: Left;   TRIGGER FINGER RELEASE Left 03/28/2017   Procedure: MIDDLE FINGER DUPREYTRENS RELEASE;  Surgeon: Leanor Kail, MD;  Location: [place];  Service: Orthopedics;  Laterality: Left;   TUBAL LIGATION     TURBINATE REDUCTION Bilateral 04/29/2019   Procedure: TURBINATE REDUCTION WITH VIVEAR;  Surgeon: Margaretha Sheffield, MD;  Location: [place];  Service: ENT;  Laterality: Bilateral;  vivear   VAGINAL HYSTERECTOMY     partial    OB History     Gravida  4   Para      Term  Preterm      AB  1   Living         SAB  1   IAB      Ectopic      Multiple      Live Births               Home Medications    Prior to Admission medications   Medication Sig Start Date End Date Taking? Authorizing Provider  levofloxacin (LEVAQUIN) 500 MG tablet Take 1 tablet (500 mg total) by mouth daily for 7 days. 11/20/21 11/27/21 Yes Nollan Muldrow, Nicholes Stairs, NP  predniSONE (DELTASONE) 20 MG tablet Take 1 tablet (20 mg total) by mouth daily for 5 days. 11/20/21 11/25/21 Yes Jakyri Brunkhorst, Nicholes Stairs, NP  albuterol (VENTOLIN HFA) 108 (90 Base) MCG/ACT inhaler Inhale 2 puffs into the lungs every 6 (six) hours as needed for wheezing or shortness of breath. 02/01/21   Tyler Pita, MD   atorvastatin (LIPITOR) 10 MG tablet Take 1 tablet (10 mg total) by mouth in the morning. 07/02/21   Glean Hess, MD  Cholecalciferol (VITAMIN D3) 5000 UNITS CAPS Take 1 capsule (5,000 Units total) by mouth daily. 09/01/14   Plonk, Gwyndolyn Saxon, MD  DULoxetine (CYMBALTA) 60 MG capsule TAKE 2 CAPSULES BY MOUTH DAILY 08/10/21   Glean Hess, MD  fluticasone [facility]) 50 MCG/ACT nasal spray Place 2 sprays into both nostrils daily as needed for allergies. 01/24/21   Poggi, Marshall Cork, MD  Multiple Vitamins-Minerals (MULTIVITAMIN WOMEN 50+) TABS Take 1 tablet by mouth daily.    [provider]  pregabalin (LYRICA) 75 MG capsule One capsule twice a day by mouth 08/30/21   Glean Hess, MD  rOPINIRole (REQUIP) 0.25 MG tablet Take 1 tablet (0.25 mg total) by mouth at bedtime as needed (restless legs). 01/24/21   Poggi, Marshall Cork, MD  sodium chloride (OCEAN) 0.65 % SOLN nasal spray Place 1 spray into both nostrils as needed for congestion.    [provider]  traZODone (DESYREL) 100 MG tablet TAKE 2 TABLETS(200 MG) BY MOUTH AT BEDTIME 09/04/21   Glean Hess, MD  trimethoprim-polymyxin b (POLYTRIM) ophthalmic solution Place 1 drop into both eyes every 4 (four) hours. 07/09/21   White, Leitha Schuller, NP  valACYclovir (VALTREX) 1000 MG tablet Take 1 tablet (1,000 mg total) by mouth daily. 11/21/20   Glean Hess, MD  vitamin B-12 (CYANOCOBALAMIN) 1000 MCG tablet Take 1,000 mcg by mouth daily.    [provider]    Family History Family History  Problem Relation Age of Onset   Lymphoma Mother    Sarcoidosis Mother    CAD Father    Breast cancer Sister 53   Brain cancer Sister     Social History Social History   Tobacco Use   Smoking status: Never   Smokeless tobacco: Never  Vaping Use   Vaping Use: Never used  Substance Use Topics   Alcohol use: No    Alcohol/week: 0.0 standard drinks of alcohol   Drug use: No     Allergies   Amoxicillin-pot clavulanate,  Beef-derived products, Pegademase bovine, and Penicillins   Review of Systems Review of Systems  HENT:  Positive for ear pain.      Physical Exam Triage Vital Signs ED Triage Vitals  Enc Vitals Group     BP 11/20/21 1926 116/89     Pulse Rate 11/20/21 1926 69     Resp 11/20/21 1926 16  Temp 11/20/21 1926 98.5 F (36.9 C)     Temp Source 11/20/21 1926 Oral     SpO2 11/20/21 1926 98 %     Weight --      Height --      Head Circumference --      Peak Flow --      Pain Score 11/20/21 1924 5     Pain Loc --      Pain Edu? --      Excl. in [place]? --    No data found.  Updated Vital Signs BP 116/89 (BP Location: Left Arm)   Pulse 69   Temp 98.5 F (36.9 C) (Oral)   Resp 16   SpO2 98%   Visual Acuity Right Eye Distance:   Left Eye Distance:   Bilateral Distance:    Right Eye Near:   Left Eye Near:    Bilateral Near:     Physical Exam HENT:     Head: Normocephalic and atraumatic.     Right Ear: Swelling and tenderness present. No drainage. A middle ear effusion is present. There is no impacted cerumen. Tympanic membrane is injected, erythematous and bulging.     Left Ear: Hearing, ear canal and external ear normal.      UC Treatments / Results  Labs (all labs ordered are listed, but only abnormal results are displayed) Labs Reviewed - No data to display  EKG   Radiology No results found.  Procedures Procedures (including critical care time)  Medications Ordered in UC Medications - No data to display  Initial Impression / Assessment and Plan / UC Course  I have reviewed the triage vital signs and the nursing notes.  Pertinent labs & imaging results that were available during my care of the patient were reviewed by me and considered in my medical decision making (see chart for details).     *** Final Clinical Impressions(s) / UC Diagnoses   Final diagnoses:  Recurrent acute suppurative otitis media of right ear without spontaneous rupture of  tympanic membrane  Other infective acute otitis externa of right ear  Acute ear pain, right     Discharge Instructions      Recommend start Levaquin 500mg  once daily for 7 days. Do not put any liquids in your right ear. May take oral Prednisone 20mg  once daily for 5 days to help with inflammation and pain. Follow-up with Dr. Daisy Blossom, ENT if ear pain does not improve within 2 to 3 days.     ED Prescriptions     Medication Sig Dispense Auth. Provider   levofloxacin (LEVAQUIN) 500 MG tablet Take 1 tablet (500 mg total) by mouth daily for 7 days. 7 tablet Katy Apo, NP   predniSONE (DELTASONE) 20 MG tablet Take 1 tablet (20 mg total) by mouth daily for 5 days. 5 tablet Aydan Levitz, Nicholes Stairs, NP      PDMP not reviewed this encounter.

## 2021-11-21 DIAGNOSIS — R899 Unspecified abnormal finding in specimens from other organs, systems and tissues: Secondary | ICD-10-CM | POA: Diagnosis not present

## 2021-11-22 ENCOUNTER — Encounter: Payer: Medicare Other | Admitting: Internal Medicine

## 2021-12-11 DIAGNOSIS — I700 Atherosclerosis of aorta: Secondary | ICD-10-CM | POA: Diagnosis not present

## 2021-12-11 DIAGNOSIS — Z780 Asymptomatic menopausal state: Secondary | ICD-10-CM | POA: Diagnosis not present

## 2021-12-11 DIAGNOSIS — M797 Fibromyalgia: Secondary | ICD-10-CM | POA: Diagnosis not present

## 2021-12-11 DIAGNOSIS — Z1330 Encounter for screening examination for mental health and behavioral disorders, unspecified: Secondary | ICD-10-CM | POA: Diagnosis not present

## 2021-12-11 DIAGNOSIS — Z0000 Encounter for general adult medical examination without abnormal findings: Secondary | ICD-10-CM | POA: Diagnosis not present

## 2021-12-11 DIAGNOSIS — E782 Mixed hyperlipidemia: Secondary | ICD-10-CM | POA: Diagnosis not present

## 2021-12-11 DIAGNOSIS — Z1211 Encounter for screening for malignant neoplasm of colon: Secondary | ICD-10-CM | POA: Diagnosis not present

## 2021-12-11 DIAGNOSIS — Z1331 Encounter for screening for depression: Secondary | ICD-10-CM | POA: Diagnosis not present

## 2021-12-11 DIAGNOSIS — Z79899 Other long term (current) drug therapy: Secondary | ICD-10-CM | POA: Diagnosis not present

## 2021-12-11 DIAGNOSIS — F419 Anxiety disorder, unspecified: Secondary | ICD-10-CM | POA: Diagnosis not present

## 2021-12-11 DIAGNOSIS — R7309 Other abnormal glucose: Secondary | ICD-10-CM | POA: Diagnosis not present

## 2021-12-11 DIAGNOSIS — J4540 Moderate persistent asthma, uncomplicated: Secondary | ICD-10-CM | POA: Diagnosis not present

## 2021-12-11 DIAGNOSIS — Z23 Encounter for immunization: Secondary | ICD-10-CM | POA: Diagnosis not present

## 2021-12-11 DIAGNOSIS — Z113 Encounter for screening for infections with a predominantly sexual mode of transmission: Secondary | ICD-10-CM | POA: Diagnosis not present

## 2021-12-11 DIAGNOSIS — F411 Generalized anxiety disorder: Secondary | ICD-10-CM | POA: Diagnosis not present

## 2021-12-11 DIAGNOSIS — F3341 Major depressive disorder, recurrent, in partial remission: Secondary | ICD-10-CM | POA: Diagnosis not present

## 2021-12-11 DIAGNOSIS — R7303 Prediabetes: Secondary | ICD-10-CM | POA: Diagnosis not present

## 2021-12-11 DIAGNOSIS — R413 Other amnesia: Secondary | ICD-10-CM | POA: Diagnosis not present

## 2021-12-11 DIAGNOSIS — Z1231 Encounter for screening mammogram for malignant neoplasm of breast: Secondary | ICD-10-CM | POA: Diagnosis not present

## 2021-12-11 DIAGNOSIS — G4700 Insomnia, unspecified: Secondary | ICD-10-CM | POA: Diagnosis not present

## 2021-12-11 DIAGNOSIS — G2581 Restless legs syndrome: Secondary | ICD-10-CM | POA: Diagnosis not present

## 2021-12-13 DIAGNOSIS — J302 Other seasonal allergic rhinitis: Secondary | ICD-10-CM | POA: Diagnosis not present

## 2021-12-13 DIAGNOSIS — H6504 Acute serous otitis media, recurrent, right ear: Secondary | ICD-10-CM | POA: Diagnosis not present

## 2021-12-21 ENCOUNTER — Other Ambulatory Visit: Payer: Self-pay | Admitting: Family Medicine

## 2021-12-21 DIAGNOSIS — Z780 Asymptomatic menopausal state: Secondary | ICD-10-CM

## 2021-12-21 DIAGNOSIS — Z1231 Encounter for screening mammogram for malignant neoplasm of breast: Secondary | ICD-10-CM

## 2022-01-03 DIAGNOSIS — M79621 Pain in right upper arm: Secondary | ICD-10-CM | POA: Diagnosis not present

## 2022-01-03 DIAGNOSIS — W19XXXA Unspecified fall, initial encounter: Secondary | ICD-10-CM | POA: Diagnosis not present

## 2022-01-03 DIAGNOSIS — S0990XA Unspecified injury of head, initial encounter: Secondary | ICD-10-CM | POA: Diagnosis not present

## 2022-01-03 DIAGNOSIS — M19011 Primary osteoarthritis, right shoulder: Secondary | ICD-10-CM | POA: Diagnosis not present

## 2022-01-03 DIAGNOSIS — Y9301 Activity, walking, marching and hiking: Secondary | ICD-10-CM | POA: Diagnosis not present

## 2022-01-03 DIAGNOSIS — Y92254 Theater (live) as the place of occurrence of the external cause: Secondary | ICD-10-CM | POA: Diagnosis not present

## 2022-01-03 DIAGNOSIS — M25511 Pain in right shoulder: Secondary | ICD-10-CM | POA: Diagnosis not present

## 2022-01-03 DIAGNOSIS — W1839XA Other fall on same level, initial encounter: Secondary | ICD-10-CM | POA: Diagnosis not present

## 2022-01-08 ENCOUNTER — Ambulatory Visit
Admission: RE | Admit: 2022-01-08 | Discharge: 2022-01-08 | Disposition: A | Discharge status: Home / Self Care | Payer: Medicare Other | Source: Ambulatory Visit | Attending: Family Medicine | Admitting: Family Medicine

## 2022-01-08 DIAGNOSIS — Z1231 Encounter for screening mammogram for malignant neoplasm of breast: Secondary | ICD-10-CM | POA: Diagnosis not present

## 2022-01-08 DIAGNOSIS — Z780 Asymptomatic menopausal state: Secondary | ICD-10-CM

## 2022-01-15 DIAGNOSIS — I6782 Cerebral ischemia: Secondary | ICD-10-CM | POA: Diagnosis not present

## 2022-01-15 DIAGNOSIS — R413 Other amnesia: Secondary | ICD-10-CM | POA: Diagnosis not present

## 2022-01-21 ENCOUNTER — Other Ambulatory Visit: Payer: Self-pay | Admitting: Internal Medicine

## 2022-01-21 DIAGNOSIS — G4700 Insomnia, unspecified: Secondary | ICD-10-CM

## 2022-01-29 DIAGNOSIS — R413 Other amnesia: Secondary | ICD-10-CM | POA: Diagnosis not present

## 2022-01-29 DIAGNOSIS — I700 Atherosclerosis of aorta: Secondary | ICD-10-CM | POA: Diagnosis not present

## 2022-01-29 DIAGNOSIS — E782 Mixed hyperlipidemia: Secondary | ICD-10-CM | POA: Diagnosis not present

## 2022-02-20 ENCOUNTER — Ambulatory Visit: Payer: Medicare Other

## 2022-03-25 ENCOUNTER — Other Ambulatory Visit: Payer: Self-pay | Admitting: Internal Medicine

## 2022-03-25 DIAGNOSIS — M797 Fibromyalgia: Secondary | ICD-10-CM

## 2022-06-10 DIAGNOSIS — G2581 Restless legs syndrome: Secondary | ICD-10-CM | POA: Diagnosis not present

## 2022-06-10 DIAGNOSIS — Z8709 Personal history of other diseases of the respiratory system: Secondary | ICD-10-CM | POA: Diagnosis not present

## 2022-06-10 DIAGNOSIS — R7303 Prediabetes: Secondary | ICD-10-CM | POA: Diagnosis not present

## 2022-06-10 DIAGNOSIS — R413 Other amnesia: Secondary | ICD-10-CM | POA: Diagnosis not present

## 2022-06-10 DIAGNOSIS — G4700 Insomnia, unspecified: Secondary | ICD-10-CM | POA: Diagnosis not present

## 2022-06-10 DIAGNOSIS — M797 Fibromyalgia: Secondary | ICD-10-CM | POA: Diagnosis not present

## 2022-06-10 DIAGNOSIS — I700 Atherosclerosis of aorta: Secondary | ICD-10-CM | POA: Diagnosis not present

## 2022-06-10 DIAGNOSIS — E538 Deficiency of other specified B group vitamins: Secondary | ICD-10-CM | POA: Diagnosis not present

## 2022-06-10 DIAGNOSIS — I10 Essential (primary) hypertension: Secondary | ICD-10-CM | POA: Diagnosis not present

## 2022-06-10 DIAGNOSIS — E782 Mixed hyperlipidemia: Secondary | ICD-10-CM | POA: Diagnosis not present

## 2022-06-10 DIAGNOSIS — J849 Interstitial pulmonary disease, unspecified: Secondary | ICD-10-CM | POA: Diagnosis not present

## 2022-06-10 DIAGNOSIS — F3341 Major depressive disorder, recurrent, in partial remission: Secondary | ICD-10-CM | POA: Diagnosis not present

## 2022-07-18 ENCOUNTER — Ambulatory Visit
Admission: EM | Admit: 2022-07-18 | Discharge: 2022-07-18 | Disposition: A | Discharge status: Home / Self Care | Payer: Medicare Other | Attending: Physician Assistant | Admitting: Physician Assistant

## 2022-07-18 DIAGNOSIS — H1089 Other conjunctivitis: Secondary | ICD-10-CM

## 2022-07-18 DIAGNOSIS — H01119 Allergic dermatitis of unspecified eye, unspecified eyelid: Secondary | ICD-10-CM | POA: Diagnosis not present

## 2022-07-18 MED ORDER — TOBRAMYCIN-DEXAMETHASONE 0.3-0.1 % OP SUSP: 1.0000 [drp] | Freq: Four times a day (QID) | OPHTHALMIC | 0 refills | Status: AC

## 2022-07-18 NOTE — ED Provider Notes (Signed)
[facility]    CSN: [id number] Arrival date & time: 07/18/22  0945      History   Chief Complaint Chief Complaint  Patient presents with   Conjunctivitis    HPI Ann Peters is a 72 y.o. female presenting for bilateral eye redness and drainage for the past 4 days.  Patient says the symptoms occur every 6 to 8 weeks and it is due to "vascular neuropathy."  She says she typically receives antibiotic/corticosteroid eyedrops and oral prednisone.  She has not seen an eye specialist.  She denies history of allergies but there is a documented history of allergic rhinitis in her chart.  She denies any injury to her eyes.  Denies any vision changes, headaches.  No eye pain.  She does have redness of her eyelids but says that it is from rubbing her eyes.  HPI  Past Medical History:  Diagnosis Date   Allergy    Anemia 02/17/2017   Anxiety    Asthma    well controlled   Cerebral microvascular disease    Depression    Diverticulitis 09/01/2014   History of shingles    ON AND OFF 6 YEARS, recurrent episodes of internal and external shingles with resulting neuralgia   Neuromuscular disorder (HCC)    POSTHERPETIC NEURALGIA, left wrist weakness   Pneumonia    HX   SEVERAL TIMES  NONE IN 3 YRS   Post herpetic neuralgia    Pre-diabetes    Restless leg syndrome    Scoliosis    Shingles    history of.  Recurrent   Sinus infection 01/18/2021   taking levaquin   Sinusitis 10/18/2016   Stroke (HCC)    mild per pt and was unaware of this    Patient Active Problem List   Diagnosis Date Noted   Restless legs syndrome (RLS) 11/21/2020   Keratosis 09/18/2020   Carpal tunnel syndrome on right 03/20/2020   Cerebral microvascular disease 11/10/2019   Pleural effusion, left 10/19/2019   Aortic atherosclerosis (HCC) 10/08/2019   Personal history of colonic polyps    Insomnia 11/05/2016   Osteoarthritis of left hip 10/10/2015   Status post left hip replacement 10/10/2015    Prediabetes 01/03/2015   Anxiety 09/01/2014   Vitamin D deficiency 09/01/2014   Herpes zoster 09/01/2014   Fibromyalgia 11/15/2013   Recurrent major depressive disorder, in partial remission (HCC) 11/15/2013   Vitamin B12 deficiency 12/23/2011   Allergic rhinitis 04/18/2011    Past Surgical History:  Procedure Laterality Date   BLADDER REPAIR     COLON SURGERY  2011   10 inches removed for benign mass, does not absorb nutrients as well   COLONOSCOPY  2011   polyp removed- Dr Marva Panda   COLONOSCOPY WITH PROPOFOL N/A 07/04/2017   Procedure: COLONOSCOPY WITH PROPOFOL;  Surgeon: Midge Minium, MD;  Location: [facility];  Service: Endoscopy;  Laterality: N/A;  recurrent shingles   GANGLION CYST EXCISION Right 01/24/2021   Procedure: EXCISION OF GANGLION CYST INVOLVING THE FLEXOR TENDON SHEATH OF RIGHT LITTLE FINGER.;  Surgeon: Christena Flake, MD;  Location: [facility];  Service: Orthopedics;  Laterality: Right;   NASAL SINUS SURGERY     TONSILLECTOMY     TOTAL HIP ARTHROPLASTY Left 10/10/2015   Procedure: LEFT TOTAL HIP ARTHROPLASTY ANTERIOR APPROACH;  Surgeon: Kathryne Hitch, MD;  Location[facility];  Service: Orthopedics;  Laterality: Left;   TRIGGER FINGER RELEASE Left 03/28/2017   Procedure: MIDDLE FINGER DUPREYTRENS RELEASE;  Surgeon: Erin Sons, MD;  Location: [facility];  Service: Orthopedics;  Laterality: Left;   TUBAL LIGATION     TURBINATE REDUCTION Bilateral 04/29/2019   Procedure: TURBINATE REDUCTION WITH VIVEAR;  Surgeon: Vernie Murders, MD;  Location: [facility];  Service: ENT;  Laterality: Bilateral;  vivear   VAGINAL HYSTERECTOMY     partial    OB History     Gravida  4   Para      Term      Preterm      AB  1   Living         SAB  1   IAB      Ectopic      Multiple      Live Births               Home Medications    Prior to Admission medications   Medication Sig Start Date End Date Taking? Authorizing Provider   albuterol (VENTOLIN HFA) 108 (90 Base) MCG/ACT inhaler Inhale 2 puffs into the lungs every 6 (six) hours as needed for wheezing or shortness of breath. 02/01/21  Yes Salena Saner, MD  atorvastatin (LIPITOR) 10 MG tablet Take 1 tablet (10 mg total) by mouth in the morning. 07/02/21  Yes Reubin Milan, MD  Cholecalciferol (VITAMIN D3) 5000 UNITS CAPS Take 1 capsule (5,000 Units total) by mouth daily. 09/01/14  Yes Plonk, Chrissie Noa, MD  DULoxetine (CYMBALTA) 60 MG capsule TAKE 2 CAPSULES BY MOUTH DAILY 08/10/21  Yes Reubin Milan, MD  fluticasone [facility]) 50 MCG/ACT nasal spray Place 2 sprays into both nostrils daily as needed for allergies. 01/24/21  Yes Poggi, Excell Seltzer, MD  Multiple Vitamins-Minerals (MULTIVITAMIN WOMEN 50+) TABS Take 1 tablet by mouth daily.   Yes [provider]  pregabalin (LYRICA) 75 MG capsule One capsule twice a day by mouth 08/30/21  Yes Reubin Milan, MD  rOPINIRole (REQUIP) 0.25 MG tablet Take 1 tablet (0.25 mg total) by mouth at bedtime as needed (restless legs). 01/24/21  Yes Poggi, Excell Seltzer, MD  sodium chloride (OCEAN) 0.65 % SOLN nasal spray Place 1 spray into both nostrils as needed for congestion.   Yes [provider]  tobramycin-dexamethasone (TOBRADEX) ophthalmic solution Place 1 drop into both eyes every 6 (six) hours for 7 days. 07/18/22 07/25/22 Yes Eusebio Friendly B, PA-C  traZODone (DESYREL) 100 MG tablet TAKE 2 TABLETS(200 MG) BY MOUTH AT BEDTIME 09/04/21  Yes Reubin Milan, MD  valACYclovir (VALTREX) 1000 MG tablet Take 1 tablet (1,000 mg total) by mouth daily. 11/21/20  Yes Reubin Milan, MD  vitamin B-12 (CYANOCOBALAMIN) 1000 MCG tablet Take 1,000 mcg by mouth daily.   Yes [provider]    Family History Family History  Problem Relation Age of Onset   Lymphoma Mother    Sarcoidosis Mother    CAD Father    Breast cancer Sister 49   Brain cancer Sister     Social History Social History   Tobacco Use   Smoking  status: Never   Smokeless tobacco: Never  Vaping Use   Vaping Use: Never used  Substance Use Topics   Alcohol use: No    Alcohol/week: 0.0 standard drinks of alcohol   Drug use: No     Allergies   Amoxicillin-pot clavulanate, Beef-derived products, Pegademase bovine, and Penicillins   Review of Systems Review of Systems  HENT:  Negative for congestion.   Eyes:  Positive for discharge, redness  and itching. Negative for photophobia, pain and visual disturbance.  Neurological:  Negative for dizziness and headaches.     Physical Exam Triage Vital Signs ED Triage Vitals  Enc Vitals Group     BP 07/18/22 1003 135/82     Pulse Rate 07/18/22 1003 74     Resp 07/18/22 1003 16     Temp 07/18/22 1003 98.1 F (36.7 C)     Temp Source 07/18/22 1003 Oral     SpO2 07/18/22 1003 96 %     Weight 07/18/22 1003 203 lb (92.1 kg)     Height 07/18/22 1003 5\' 8"  (1.727 m)     Head Circumference --      Peak Flow --      Pain Score 07/18/22 1005 10     Pain Loc --      Pain Edu? --      Excl. in [place]? --    No data found.  Updated Vital Signs BP 135/82 (BP Location: Left Arm)   Pulse 74   Temp 98.1 F (36.7 C) (Oral)   Resp 16   Ht 5\' 8"  (1.727 m)   Wt 203 lb (92.1 kg)   SpO2 96%   BMI 30.87 kg/m     Physical Exam Vitals and nursing note reviewed.  Constitutional:      General: She is not in acute distress.    Appearance: Normal appearance. She is not ill-appearing or toxic-appearing.  HENT:     Head: Normocephalic and atraumatic.     Nose: Nose normal.     Mouth/Throat:     Mouth: Mucous membranes are moist.     Pharynx: Oropharynx is clear.  Eyes:     General: No scleral icterus.       Right eye: No discharge.        Left eye: No discharge.     Conjunctiva/sclera:     Right eye: Right conjunctiva is injected.     Left eye: Left conjunctiva is injected.     Comments: Small amount of thick whitish drainage from corners of eyes.   Very mild erythema thematous dry rash  of bilateral upper and lower eyelids.  Cardiovascular:     Rate and Rhythm: Normal rate and regular rhythm.     Heart sounds: Normal heart sounds.  Pulmonary:     Effort: Pulmonary effort is normal. No respiratory distress.     Breath sounds: Normal breath sounds.  Musculoskeletal:     Cervical back: Neck supple.  Skin:    General: Skin is dry.  Neurological:     General: No focal deficit present.     Mental Status: She is alert. Mental status is at baseline.     Motor: No weakness.     Gait: Gait normal.  Psychiatric:        Mood and Affect: Mood normal.        Behavior: Behavior normal.        Thought Content: Thought content normal.      UC Treatments / Results  Labs (all labs ordered are listed, but only abnormal results are displayed) Labs Reviewed - No data to display  EKG   Radiology No results found.  Procedures Procedures (including critical care time)  Medications Ordered in UC Medications - No data to display  Initial Impression / Assessment and Plan / UC Course  I have reviewed the triage vital signs and the nursing notes.  Pertinent labs & imaging results that were  available during my care of the patient were reviewed by me and considered in my medical decision making (see chart for details).   72 year old female presents for bilateral eye redness and discharge as well as pruritus x 4 days.  Reports she has flareups of these symptoms every 6 to 8 weeks and request to have a corticosteroid/antibiotic eyedrop and oral prednisone.  She denies a history of allergies but there is a documented history of allergic rhinitis in her chart.  She has been seen at this department and treated for allergic conjunctivitis as well as bacterial conjunctivitis but I do not see where she has ever been prescribed oral prednisone for eye complaints.  Patient's current presentation is most consistent with suspected allergic conjunctivitis and eczematous rash of eyelids.  Advised  over-the-counter redness relief eyedrops and antihistamines.  Will prescribe TobraDex for inflammation and potential bacterial conjunctivitis, but holding off on oral prednisone at this time as I do not believe it is indicated.  I did advise patient to contact [facility] for an appointment for these persistent symptoms which she says is not due to allergies.  Will need further workup for these complaints.   Final Clinical Impressions(s) / UC Diagnoses   Final diagnoses:  Other conjunctivitis of both eyes  Eyelid dermatitis, allergic/contact     Discharge Instructions      -I sent a antibiotic/corticosteroid eyedrop to the pharmacy but it is imperative that you follow-up with an eye specialist.  You should not be having the symptoms occur every 6 to 8 weeks.  I think it is largely related to allergies but you deny history of allergies.  I think it would be helpful if you start over-the-counter redness relief eyedrops and an antihistamine such as Claritin.  Please contact [facility] today for an appointment.     ED Prescriptions     Medication Sig Dispense Auth. Provider   tobramycin-dexamethasone [facility]) ophthalmic solution Place 1 drop into both eyes every 6 (six) hours for 7 days. 5 mL Shirlee Latch, PA-C      PDMP not reviewed this encounter.   Shirlee Latch, PA-C 07/18/22 1032

## 2022-07-18 NOTE — ED Triage Notes (Signed)
Pt c/o bilateral eye redness & drainage x4 days. Req prednisone & eye drops w/sulfur. States it vascular neuropathy.

## 2022-07-18 NOTE — Discharge Instructions (Signed)
-  I sent a antibiotic/corticosteroid eyedrop to the pharmacy but it is imperative that you follow-up with an eye specialist.  You should not be having the symptoms occur every 6 to 8 weeks.  I think it is largely related to allergies but you deny history of allergies.  I think it would be helpful if you start over-the-counter redness relief eyedrops and an antihistamine such as Claritin.  Please contact [facility] today for an appointment.

## 2022-08-13 DIAGNOSIS — Z1211 Encounter for screening for malignant neoplasm of colon: Secondary | ICD-10-CM | POA: Diagnosis not present

## 2022-08-29 DIAGNOSIS — Z7289 Other problems related to lifestyle: Secondary | ICD-10-CM | POA: Diagnosis not present

## 2022-08-29 DIAGNOSIS — G309 Alzheimer's disease, unspecified: Secondary | ICD-10-CM | POA: Diagnosis not present

## 2022-08-29 DIAGNOSIS — F01A0 Vascular dementia, mild, without behavioral disturbance, psychotic disturbance, mood disturbance, and anxiety: Secondary | ICD-10-CM | POA: Diagnosis not present

## 2022-08-29 DIAGNOSIS — E039 Hypothyroidism, unspecified: Secondary | ICD-10-CM | POA: Diagnosis not present

## 2022-08-29 DIAGNOSIS — I6930 Unspecified sequelae of cerebral infarction: Secondary | ICD-10-CM | POA: Diagnosis not present

## 2022-08-29 DIAGNOSIS — E559 Vitamin D deficiency, unspecified: Secondary | ICD-10-CM | POA: Diagnosis not present

## 2022-08-29 DIAGNOSIS — Z9181 History of falling: Secondary | ICD-10-CM | POA: Diagnosis not present

## 2022-08-29 DIAGNOSIS — R7303 Prediabetes: Secondary | ICD-10-CM | POA: Diagnosis not present

## 2022-10-29 DIAGNOSIS — J4540 Moderate persistent asthma, uncomplicated: Secondary | ICD-10-CM | POA: Diagnosis not present

## 2022-10-29 DIAGNOSIS — E669 Obesity, unspecified: Secondary | ICD-10-CM | POA: Diagnosis not present

## 2022-10-29 DIAGNOSIS — I10 Essential (primary) hypertension: Secondary | ICD-10-CM | POA: Diagnosis not present

## 2022-11-13 ENCOUNTER — Encounter: Payer: Self-pay | Admitting: Pulmonary Disease

## 2022-11-28 ENCOUNTER — Ambulatory Visit
Admission: EM | Admit: 2022-11-28 | Discharge: 2022-11-28 | Disposition: A | Discharge status: Home / Self Care | Payer: Medicare Other | Attending: Family Medicine | Admitting: Family Medicine

## 2022-11-28 DIAGNOSIS — H109 Unspecified conjunctivitis: Secondary | ICD-10-CM

## 2022-11-28 DIAGNOSIS — H04123 Dry eye syndrome of bilateral lacrimal glands: Secondary | ICD-10-CM

## 2022-11-28 MED ORDER — OLOPATADINE HCL 0.1 % OP SOLN: 1.0000 [drp] | Freq: Two times a day (BID) | OPHTHALMIC | 12 refills | Status: AC

## 2022-11-28 MED ORDER — ERYTHROMYCIN 5 MG/GM OP OINT: TOPICAL_OINTMENT | OPHTHALMIC | 0 refills | Status: AC

## 2022-11-28 MED ORDER — HYDROCORTISONE 2.5 % EX LOTN: TOPICAL_LOTION | Freq: Two times a day (BID) | CUTANEOUS | 0 refills | Status: AC

## 2022-11-28 NOTE — Discharge Instructions (Addendum)
Stop by the pharmacy to pick up your antibiotic eye medication.  Follow up with your primary eyecare provider or [facility] if symptoms suddenly worsen or you have little improvement in your eye symptoms.    Call your insurance to see if there is any other ophthalmologist in the area that are covered with your insurance.

## 2022-11-28 NOTE — ED Triage Notes (Signed)
Pt c/o eye problem -redness, itching, and pain x1day  Pt states that she is here frequently and requests a steroid eye drop.   Pt states that she is looking for an eye doctor.

## 2022-11-28 NOTE — ED Provider Notes (Addendum)
[facility]    CSN: [id number] Arrival date & time: 11/28/22  1425      History   Chief Complaint No chief complaint on file.   HPI HPI  Ann Peters is a 72 y.o. female.    Ann Peters presents for bilateral eye that occurs several times a year.  She follows with [facility] but has not been able to get in and she states they often do not do anything about it.  Uses Systane eyedrops for dry eyes.  Her eyes have been very itchy recently.  They have been more red than normal.  States that she typically gets some steroid eyedrops for this.  The lids of her upper and lower lids have been more red and irritated as well.  Ann Peters has not had any trouble seeing.  Ann Peters has otherwise been well and has no additional concerns today.     Past Medical History:  Diagnosis Date   Allergy    Anemia 02/17/2017   Anxiety    Asthma    well controlled   Cerebral microvascular disease    Depression    Diverticulitis 09/01/2014   History of shingles    ON AND OFF 6 YEARS, recurrent episodes of internal and external shingles with resulting neuralgia   Neuromuscular disorder (HCC)    POSTHERPETIC NEURALGIA, left wrist weakness   Pneumonia    HX   SEVERAL TIMES  NONE IN 3 YRS   Post herpetic neuralgia    Pre-diabetes    Restless leg syndrome    Scoliosis    Shingles    history of.  Recurrent   Sinus infection 01/18/2021   taking levaquin   Sinusitis 10/18/2016   Stroke (HCC)    mild per pt and was unaware of this    Patient Active Problem List   Diagnosis Date Noted   Restless legs syndrome (RLS) 11/21/2020   Keratosis 09/18/2020   Carpal tunnel syndrome on right 03/20/2020   Cerebral microvascular disease 11/10/2019   Pleural effusion, left 10/19/2019   Aortic atherosclerosis (HCC) 10/08/2019   History of colonic polyps    Insomnia 11/05/2016   Osteoarthritis of left hip 10/10/2015   Status post left hip replacement 10/10/2015   Prediabetes 01/03/2015    Anxiety 09/01/2014   Vitamin D deficiency 09/01/2014   Herpes zoster 09/01/2014   Fibromyalgia 11/15/2013   Recurrent major depressive disorder, in partial remission (HCC) 11/15/2013   Vitamin B12 deficiency 12/23/2011   Allergic rhinitis 04/18/2011    Past Surgical History:  Procedure Laterality Date   BLADDER REPAIR     COLON SURGERY  2011   10 inches removed for benign mass, does not absorb nutrients as well   COLONOSCOPY  2011   polyp removed- Dr Marva Panda   COLONOSCOPY WITH PROPOFOL N/A 07/04/2017   Procedure: COLONOSCOPY WITH PROPOFOL;  Surgeon: Midge Minium, MD;  Location: [facility];  Service: Endoscopy;  Laterality: N/A;  recurrent shingles   GANGLION CYST EXCISION Right 01/24/2021   Procedure: EXCISION OF GANGLION CYST INVOLVING THE FLEXOR TENDON SHEATH OF RIGHT LITTLE FINGER.;  Surgeon: Christena Flake, MD;  Location: [facility];  Service: Orthopedics;  Laterality: Right;   NASAL SINUS SURGERY     TONSILLECTOMY     TOTAL HIP ARTHROPLASTY Left 10/10/2015   Procedure: LEFT TOTAL HIP ARTHROPLASTY ANTERIOR APPROACH;  Surgeon: Kathryne Hitch, MD;  Location: [facility];  Service: Orthopedics;  Laterality: Left;   TRIGGER FINGER RELEASE Left 03/28/2017  Procedure: MIDDLE FINGER DUPREYTRENS RELEASE;  Surgeon: Erin Sons, MD;  Location: [facility];  Service: Orthopedics;  Laterality: Left;   TUBAL LIGATION     TURBINATE REDUCTION Bilateral 04/29/2019   Procedure: TURBINATE REDUCTION WITH VIVEAR;  Surgeon: Vernie Murders, MD;  Location: [facility];  Service: ENT;  Laterality: Bilateral;  vivear   VAGINAL HYSTERECTOMY     partial    OB History     Gravida  4   Para      Term      Preterm      AB  1   Living         SAB  1   IAB      Ectopic      Multiple      Live Births               Home Medications    Prior to Admission medications   Medication Sig Start Date End Date Taking? Authorizing Provider  albuterol (VENTOLIN  HFA) 108 (90 Base) MCG/ACT inhaler Inhale 2 puffs into the lungs every 6 (six) hours as needed for wheezing or shortness of breath. 02/01/21  Yes Salena Saner, MD  atorvastatin (LIPITOR) 10 MG tablet Take 1 tablet (10 mg total) by mouth in the morning. 07/02/21  Yes Reubin Milan, MD  Cholecalciferol (VITAMIN D3) 5000 UNITS CAPS Take 1 capsule (5,000 Units total) by mouth daily. 09/01/14  Yes Plonk, Chrissie Noa, MD  DULoxetine (CYMBALTA) 60 MG capsule TAKE 2 CAPSULES BY MOUTH DAILY 08/10/21  Yes Reubin Milan, MD  erythromycin ophthalmic ointment Place a 1/2 inch ribbon of ointment into the lower eyelid 3 times a day for 7 days 11/28/22  Yes Mollye Guinta, DO  fluticasone (FLONASE) 50 MCG/ACT nasal spray Place 2 sprays into both nostrils daily as needed for allergies. 01/24/21  Yes Poggi, Excell Seltzer, MD  hydrocortisone 2.5 % lotion Apply topically 2 (two) times daily. 11/28/22  Yes Icelyn Navarrete, Seward Meth, DO  Multiple Vitamins-Minerals (MULTIVITAMIN WOMEN 50+) TABS Take 1 tablet by mouth daily.   Yes [provider]  olopatadine (PATADAY) 0.1 % ophthalmic solution Place 1 drop into both eyes 2 (two) times daily. 11/28/22  Yes Farran Amsden, Seward Meth, DO  pregabalin (LYRICA) 75 MG capsule One capsule twice a day by mouth 08/30/21  Yes Reubin Milan, MD  rOPINIRole (REQUIP) 0.25 MG tablet Take 1 tablet (0.25 mg total) by mouth at bedtime as needed (restless legs). 01/24/21  Yes Poggi, Excell Seltzer, MD  sodium chloride (OCEAN) 0.65 % SOLN nasal spray Place 1 spray into both nostrils as needed for congestion.   Yes [provider]  traZODone (DESYREL) 100 MG tablet TAKE 2 TABLETS(200 MG) BY MOUTH AT BEDTIME 09/04/21  Yes Reubin Milan, MD  valACYclovir (VALTREX) 1000 MG tablet Take 1 tablet (1,000 mg total) by mouth daily. 11/21/20  Yes Reubin Milan, MD  vitamin B-12 (CYANOCOBALAMIN) 1000 MCG tablet Take 1,000 mcg by mouth daily.   Yes [provider]    Family History Family History   Problem Relation Age of Onset   Lymphoma Mother    Sarcoidosis Mother    CAD Father    Breast cancer Sister 57   Brain cancer Sister     Social History Social History   Tobacco Use   Smoking status: Never   Smokeless tobacco: Never  Vaping Use   Vaping status: Never Used  Substance Use Topics   Alcohol use: No  Alcohol/week: 0.0 standard drinks of alcohol   Drug use: No     Allergies   Amoxicillin-pot clavulanate, Beef-derived products, Pegademase bovine, and Penicillins   Review of Systems Review of Systems : negative unless otherwise stated in HPI.      Physical Exam Triage Vital Signs ED Triage Vitals  Encounter Vitals Group     BP 11/28/22 1456 100/74     Systolic BP Percentile --      Diastolic BP Percentile --      Pulse Rate 11/28/22 1456 80     Resp 11/28/22 1456 (!) 24     Temp 11/28/22 1456 98 F (36.7 C)     Temp Source 11/28/22 1456 Oral     SpO2 11/28/22 1456 99 %     Weight 11/28/22 1455 205 lb (93 kg)     Height 11/28/22 1455 5\' 7"  (1.702 m)     Head Circumference --      Peak Flow --      Pain Score 11/28/22 1455 9     Pain Loc --      Pain Education --      Exclude from Growth Chart --    No data found.  Updated Vital Signs BP 100/74 (BP Location: Left Arm)   Pulse 80   Temp 98 F (36.7 C) (Oral)   Resp (!) 24   Ht 5\' 7"  (1.702 m)   Wt 93 kg   SpO2 99%   BMI 32.11 kg/m   Visual Acuity Right Eye Distance:   Left Eye Distance:   Bilateral Distance:    Right Eye Near:   Left Eye Near:    Bilateral Near:     Physical Exam  GEN:  well appearing female, in no acute distress  RESP: no increased work of breathing EYES:     General: Bilateral upper and lower Lids are erythematous.  Vision grossly intact. Gaze aligned appropriately.        Right eye: No discharge or borderline        Left eye: No discharge or hordeolum.     Extraocular Movements: Extraocular movements intact.     Conjunctiva/sclera: Bilateral scleral  injection but no chemosis or hemorrhage. SKIN: warm and dry   UC Treatments / Results  Labs (all labs ordered are listed, but only abnormal results are displayed) Labs Reviewed - No data to display  EKG   Radiology No results found.  Procedures Procedures (including critical care time)  Medications Ordered in UC Medications - No data to display  Initial Impression / Assessment and Plan / UC Course  I have reviewed the triage vital signs and the nursing notes.  Pertinent labs & imaging results that were available during my care of the patient were reviewed by me and considered in my medical decision making (see chart for details).     Patient is a 72 y.o. female who presents after acute on chronic bilateral eye redness and irritation.  On exam, she has a evidence of conjunctivitis bacterial versus allergic. Treat with erythromycin ointment as this will also provide her some moisture relief.  Explained to patient that I do not prescribe steroid eye drops and she will need to see her ophthalmologist at [facility] for this.  Treat possible allergic conjunctivitis with Pataday drops.  Recommended she use topical steroid around her eyelids but not in her eye.   I called [facility] who was not able to accommodate patient today. Advised to  follow-up with an ophthalmologist.   Understanding voiced.   Discussed MDM, treatment plan and plan for follow-up with patient  who agrees with plan.  Final Clinical Impressions(s) / UC Diagnoses   Final diagnoses:  Conjunctivitis of both eyes, unspecified conjunctivitis type  Chronic dryness of both eyes     Discharge Instructions      Stop by the pharmacy to pick up your antibiotic eye medication.  Follow up with your primary eyecare provider or [facility] if symptoms suddenly worsen or you have little improvement in your eye symptoms.    Call your insurance to see if there is any other ophthalmologist in the area  that are covered with your insurance.      ED Prescriptions     Medication Sig Dispense Auth. Provider   erythromycin ophthalmic ointment Place a 1/2 inch ribbon of ointment into the lower eyelid 3 times a day for 7 days 3.5 g Ernie Kasler, DO   hydrocortisone 2.5 % lotion Apply topically 2 (two) times daily. 59 mL Ronin Crager, DO   olopatadine (PATADAY) 0.1 % ophthalmic solution Place 1 drop into both eyes 2 (two) times daily. 5 mL Katha Cabal, DO      PDMP not reviewed this encounter.      Katha Cabal, DO 11/28/22 2024

## 2022-12-04 DIAGNOSIS — F01A0 Vascular dementia, mild, without behavioral disturbance, psychotic disturbance, mood disturbance, and anxiety: Secondary | ICD-10-CM | POA: Diagnosis not present

## 2022-12-04 DIAGNOSIS — E039 Hypothyroidism, unspecified: Secondary | ICD-10-CM | POA: Diagnosis not present

## 2022-12-04 DIAGNOSIS — G2581 Restless legs syndrome: Secondary | ICD-10-CM | POA: Diagnosis not present

## 2022-12-04 DIAGNOSIS — B009 Herpesviral infection, unspecified: Secondary | ICD-10-CM | POA: Diagnosis not present

## 2022-12-09 DIAGNOSIS — M720 Palmar fascial fibromatosis [Dupuytren]: Secondary | ICD-10-CM | POA: Diagnosis not present

## 2023-01-02 ENCOUNTER — Other Ambulatory Visit: Payer: Self-pay | Admitting: Surgery

## 2023-01-03 ENCOUNTER — Other Ambulatory Visit: Payer: Self-pay

## 2023-01-03 ENCOUNTER — Encounter
Admission: RE | Admit: 2023-01-03 | Discharge: 2023-01-03 | Disposition: A | Discharge status: Home / Self Care | Payer: Medicare Other | Source: Ambulatory Visit | Attending: Surgery | Admitting: Surgery

## 2023-01-03 DIAGNOSIS — Z01812 Encounter for preprocedural laboratory examination: Secondary | ICD-10-CM

## 2023-01-03 DIAGNOSIS — I6789 Other cerebrovascular disease: Secondary | ICD-10-CM

## 2023-01-03 DIAGNOSIS — I700 Atherosclerosis of aorta: Secondary | ICD-10-CM

## 2023-01-03 DIAGNOSIS — M720 Palmar fascial fibromatosis [Dupuytren]: Secondary | ICD-10-CM

## 2023-01-03 HISTORY — DX: Palmar fascial fibromatosis (dupuytren): M72.0

## 2023-01-03 HISTORY — DX: Fibromyalgia: M79.7

## 2023-01-03 HISTORY — DX: Atherosclerosis of aorta: I70.0

## 2023-01-03 NOTE — Patient Instructions (Addendum)
Your procedure is scheduled on: 01/08/2023 Wednesday  Report to the Registration Desk on the 1st floor of the [facility]. To find out your arrival time, please call [phone] between 1PM - 3PM on: 11 /19/2024 Tuesday  If your arrival time is 6:00 am, do not arrive before that time as the [facility] entrance doors do not open until 6:00 am.  REMEMBER: Instructions that are not followed completely may result in serious medical risk, up to and including death; or upon the discretion of your surgeon and anesthesiologist your surgery may need to be rescheduled.  Do not eat food after midnight the night before surgery.  No gum chewing or hard candies.  You may however, drink CLEAR liquids up to 2 hours before you are scheduled to arrive for your surgery. Do not drink anything within 2 hours of your scheduled arrival time.  Clear liquids include: - water  - apple juice without pulp - gatorade (not RED colors) - black coffee or tea (Do NOT add milk or creamers to the coffee or tea) Do NOT drink anything that is not on this list.   In addition, your doctor has ordered for you to drink the provided:  Ensure Pre-Surgery Clear Carbohydrate Drink   Drinking this carbohydrate drink up to two hours before surgery helps to reduce insulin resistance and improve patient outcomes. Please complete drinking 2 hours before scheduled arrival time.  One week prior to surgery: Stop Anti-inflammatories (NSAIDS) such as Advil, Aleve, Ibuprofen, Motrin, Naproxen, Naprosyn and Aspirin based products such as Excedrin, Goody's Powder, BC Powder. Stop ANY OVER THE COUNTER supplements until after surgery.  You may however, continue to take Tylenol if needed for pain up until the day of surgery.   Continue taking all of your other prescription medications up until the day of surgery.  ON THE DAY OF SURGERY ONLY TAKE THESE MEDICATIONS WITH SIPS OF WATER:  atorvastatin (LIPITOR)  DULoxetine  (CYMBALTA)   Use inhalers on the day of surgery and bring to the hospital.   No Alcohol for 24 hours before or after surgery.  No Smoking including e-cigarettes for 24 hours before surgery.  No chewable tobacco products for at least 6 hours before surgery.  No nicotine patches on the day of surgery.  Do not use any "recreational" drugs for at least a week (preferably 2 weeks) before your surgery.  Please be advised that the combination of cocaine and anesthesia may have negative outcomes, up to and including death. If you test positive for cocaine, your surgery will be cancelled.  On the morning of surgery brush your teeth with toothpaste and water, you may rinse your mouth with mouthwash if you wish. Do not swallow any toothpaste or mouthwash.  Use CHG Soap or wipes as directed on instruction sheet.  Do not wear jewelry, make-up, hairpins, clips or nail polish.  For welded (permanent) jewelry: bracelets, anklets, waist bands, etc.  Please have this removed prior to surgery.  If it is not removed, there is a chance that hospital personnel will need to cut it off on the day of surgery.  Do not wear lotions, powders, or perfumes.   Do not shave body hair from the neck down 48 hours before surgery.  Contact lenses, hearing aids and dentures may not be worn into surgery.  Do not bring valuables to the hospital. [facility] is not responsible for any missing/lost belongings or valuables.     Notify your doctor if there is any  change in your medical condition (cold, fever, infection).  Wear comfortable clothing (specific to your surgery type) to the hospital.  After surgery, you can help prevent lung complications by doing breathing exercises.  Take deep breaths and cough every 1-2 hours. Your doctor may order a device called an Incentive Spirometer to help you take deep breaths. When coughing or sneezing, hold a pillow firmly against your incision with both hands. This is called  "splinting." Doing this helps protect your incision. It also decreases belly discomfort.    If you are being discharged the day of surgery, you will not be allowed to drive home. You will need a responsible individual to drive you home and stay with you for 24 hours after surgery.    Please call the Pre-admissions Testing Dept. at [phone] if you have any questions about these instructions.  Surgery Visitation Policy:  Patients having surgery or a procedure may have two visitors.  Children under the age of 36 must have an adult with them who is not the patient.         Preparing for Surgery with CHLORHEXIDINE GLUCONATE (CHG) Soap  Chlorhexidine Gluconate (CHG) Soap  o An antiseptic cleaner that kills germs and bonds with the skin to continue killing germs even after washing  o Used for showering the night before surgery and morning of surgery  Before surgery, you can play an important role by reducing the number of germs on your skin.  CHG (Chlorhexidine gluconate) soap is an antiseptic cleanser which kills germs and bonds with the skin to continue killing germs even after washing.  Please do not use if you have an allergy to CHG or antibacterial soaps. If your skin becomes reddened/irritated stop using the CHG.  1. Shower the NIGHT BEFORE SURGERY and the MORNING OF SURGERY with CHG soap.  2. If you choose to wash your hair, wash your hair first as usual with your normal shampoo.  3. After shampooing, rinse your hair and body thoroughly to remove the shampoo.  4. Use CHG as you would any other liquid soap. You can apply CHG directly to the skin and wash gently with a scrungie or a clean washcloth.  5. Apply the CHG soap to your body only from the neck down. Do not use on open wounds or open sores. Avoid contact with your eyes, ears, mouth, and genitals (private parts). Wash face and genitals (private parts) with your normal soap.  6. Wash thoroughly, paying special  attention to the area where your surgery will be performed.  7. Thoroughly rinse your body with warm water.  8. Do not shower/wash with your normal soap after using and rinsing off the CHG soap.  9. Pat yourself dry with a clean towel.  10. Wear clean pajamas to bed the night before surgery.  12. Place clean sheets on your bed the night of your first shower and do not sleep with pets.  13. Shower again with the CHG soap on the day of surgery prior to arriving at the hospital.  14. Do not apply any deodorants/lotions/powders.  15. Please wear clean clothes to the hospital.

## 2023-01-07 ENCOUNTER — Inpatient Hospital Stay
Admission: RE | Admit: 2023-01-07 | Discharge: 2023-01-07 | Discharge status: Home / Self Care | Payer: Medicare Other | Source: Ambulatory Visit | Attending: Surgery

## 2023-01-07 ENCOUNTER — Encounter: Payer: Self-pay | Admitting: Urgent Care

## 2023-01-07 DIAGNOSIS — I6789 Other cerebrovascular disease: Secondary | ICD-10-CM | POA: Insufficient documentation

## 2023-01-07 DIAGNOSIS — M720 Palmar fascial fibromatosis [Dupuytren]: Secondary | ICD-10-CM | POA: Insufficient documentation

## 2023-01-07 DIAGNOSIS — Z01818 Encounter for other preprocedural examination: Secondary | ICD-10-CM | POA: Insufficient documentation

## 2023-01-07 DIAGNOSIS — Z01812 Encounter for preprocedural laboratory examination: Secondary | ICD-10-CM

## 2023-01-07 DIAGNOSIS — Z01810 Encounter for preprocedural cardiovascular examination: Secondary | ICD-10-CM | POA: Diagnosis not present

## 2023-01-07 DIAGNOSIS — I700 Atherosclerosis of aorta: Secondary | ICD-10-CM | POA: Insufficient documentation

## 2023-01-07 LAB — BASIC METABOLIC PANEL
Anion gap: 10 (ref 5–15)
BUN: 21 mg/dL (ref 8–23)
CO2: 19 mmol/L — ABNORMAL LOW (ref 22–32)
Calcium: 9.3 mg/dL (ref 8.9–10.3)
Chloride: 106 mmol/L (ref 98–111)
Creatinine, Ser: 0.92 mg/dL (ref 0.44–1.00)
GFR, Estimated: >60 mL/min (ref >60)
Glucose, Bld: 164 mg/dL — ABNORMAL HIGH (ref 70–99)
Potassium: 3.4 mmol/L — ABNORMAL LOW (ref 3.5–5.1)
Sodium: 135 mmol/L (ref 135–145)

## 2023-01-07 LAB — CBC
HCT: 40.1 % (ref 36.0–46.0)
Hemoglobin: 14.1 g/dL (ref 12.0–15.0)
MCH: 31.1 pg (ref 26.0–34.0)
MCHC: 35.2 g/dL (ref 30.0–36.0)
MCV: 88.3 fL (ref 80.0–100.0)
Platelets: 382 10*3/uL (ref 150–400)
RBC: 4.54 MIL/uL (ref 3.87–5.11)
RDW: 13.5 % (ref 11.5–15.5)
WBC: 5.8 10*3/uL (ref 4.0–10.5)
nRBC: 0 % (ref 0.0–0.2)

## 2023-01-07 MED ORDER — CEFAZOLIN SODIUM-DEXTROSE 2-4 GM/100ML-% IV SOLN
2.0000 g | INTRAVENOUS | Status: AC
  Administered 2023-01-08: 2 g via INTRAVENOUS

## 2023-01-07 MED ORDER — LACTATED RINGERS IV SOLN
INTRAVENOUS | Status: DC
Start: 2023-01-07 — End: 2023-01-08

## 2023-01-07 MED ORDER — ORAL CARE MOUTH RINSE
15.0000 mL | Freq: Once | OROMUCOSAL | Status: DC
Start: 2023-01-07 — End: 2023-01-08

## 2023-01-08 ENCOUNTER — Other Ambulatory Visit: Payer: Self-pay

## 2023-01-08 ENCOUNTER — Encounter: Payer: Self-pay | Admitting: Surgery

## 2023-01-08 ENCOUNTER — Ambulatory Visit
Admission: RE | Admit: 2023-01-08 | Discharge: 2023-01-08 | Disposition: A | Discharge status: Home / Self Care | Payer: Medicare Other | Source: Ambulatory Visit | Attending: Surgery | Admitting: Surgery

## 2023-01-08 ENCOUNTER — Encounter
Admission: RE | Disposition: A | Discharge status: Home / Self Care | Payer: Self-pay | Source: Ambulatory Visit | Attending: Surgery

## 2023-01-08 ENCOUNTER — Ambulatory Visit: Payer: Medicare Other | Admitting: Anesthesiology

## 2023-01-08 DIAGNOSIS — M720 Palmar fascial fibromatosis [Dupuytren]: Secondary | ICD-10-CM | POA: Insufficient documentation

## 2023-01-08 DIAGNOSIS — J45909 Unspecified asthma, uncomplicated: Secondary | ICD-10-CM | POA: Diagnosis not present

## 2023-01-08 HISTORY — PX: DUPUYTREN CONTRACTURE RELEASE: SHX1478

## 2023-01-08 SURGERY — RELEASE, DUPUYTREN CONTRACTURE
Anesthesia: General | Laterality: Right

## 2023-01-08 MED ORDER — PHENYLEPHRINE 80 MCG/ML (10ML) SYRINGE FOR IV PUSH (FOR BLOOD PRESSURE SUPPORT)
PREFILLED_SYRINGE | INTRAVENOUS | Status: DC | PRN
  Administered 2023-01-08: 80 ug via INTRAVENOUS

## 2023-01-08 MED ORDER — 0.9 % SODIUM CHLORIDE (POUR BTL) OPTIME
TOPICAL | Status: DC | PRN
  Administered 2023-01-08: 500 mL

## 2023-01-08 MED ORDER — ONDANSETRON HCL 4 MG/2ML IJ SOLN: 4.0000 mg | Freq: Four times a day (QID) | INTRAMUSCULAR | Status: DC | PRN

## 2023-01-08 MED ORDER — ACETAMINOPHEN 325 MG PO TABS: 325.0000 mg | ORAL_TABLET | Freq: Four times a day (QID) | ORAL | Status: DC | PRN

## 2023-01-08 MED ORDER — MIDAZOLAM HCL 2 MG/2ML IJ SOLN
INTRAMUSCULAR | Status: DC | PRN
  Administered 2023-01-08: 2 mg via INTRAVENOUS

## 2023-01-08 MED ORDER — ONDANSETRON HCL 4 MG PO TABS: 4.0000 mg | ORAL_TABLET | Freq: Four times a day (QID) | ORAL | Status: DC | PRN

## 2023-01-08 MED ORDER — PROPOFOL 10 MG/ML IV BOLUS
INTRAVENOUS | Status: AC
  Filled 2023-01-08: qty 40

## 2023-01-08 MED ORDER — DROPERIDOL 2.5 MG/ML IJ SOLN: 0.6250 mg | Freq: Once | INTRAMUSCULAR | Status: DC | PRN

## 2023-01-08 MED ORDER — ONDANSETRON HCL 4 MG/2ML IJ SOLN
INTRAMUSCULAR | Status: DC | PRN
  Administered 2023-01-08: 4 mg via INTRAVENOUS

## 2023-01-08 MED ORDER — DEXAMETHASONE SODIUM PHOSPHATE 10 MG/ML IJ SOLN
INTRAMUSCULAR | Status: DC | PRN
  Administered 2023-01-08: 10 mg via INTRAVENOUS

## 2023-01-08 MED ORDER — METOCLOPRAMIDE HCL 10 MG PO TABS: 5.0000 mg | ORAL_TABLET | Freq: Three times a day (TID) | ORAL | Status: DC | PRN

## 2023-01-08 MED ORDER — HYDROCODONE-ACETAMINOPHEN 5-325 MG PO TABS: 1.0000 | ORAL_TABLET | ORAL | Status: DC | PRN

## 2023-01-08 MED ORDER — KETOROLAC TROMETHAMINE 30 MG/ML IJ SOLN
INTRAMUSCULAR | Status: DC | PRN
  Administered 2023-01-08: 30 mg via INTRAVENOUS

## 2023-01-08 MED ORDER — CHLORHEXIDINE GLUCONATE 0.12 % MT SOLN
OROMUCOSAL | Status: AC
  Filled 2023-01-08: qty 15

## 2023-01-08 MED ORDER — CEFAZOLIN SODIUM-DEXTROSE 2-4 GM/100ML-% IV SOLN
INTRAVENOUS | Status: AC
  Filled 2023-01-08: qty 100

## 2023-01-08 MED ORDER — ONDANSETRON HCL 4 MG/2ML IJ SOLN
INTRAMUSCULAR | Status: AC
  Filled 2023-01-08: qty 4

## 2023-01-08 MED ORDER — METOCLOPRAMIDE HCL 5 MG/ML IJ SOLN: 5.0000 mg | Freq: Three times a day (TID) | INTRAMUSCULAR | Status: DC | PRN

## 2023-01-08 MED ORDER — OXYCODONE HCL 5 MG PO TABS: 5.0000 mg | ORAL_TABLET | Freq: Once | ORAL | Status: DC | PRN

## 2023-01-08 MED ORDER — FENTANYL CITRATE (PF) 100 MCG/2ML IJ SOLN
INTRAMUSCULAR | Status: DC | PRN
  Administered 2023-01-08: 50 ug via INTRAVENOUS

## 2023-01-08 MED ORDER — OXYCODONE HCL 5 MG/5ML PO SOLN: 5.0000 mg | Freq: Once | ORAL | Status: DC | PRN

## 2023-01-08 MED ORDER — FENTANYL CITRATE (PF) 100 MCG/2ML IJ SOLN: 25.0000 ug | INTRAMUSCULAR | Status: DC | PRN

## 2023-01-08 MED ORDER — EPHEDRINE 5 MG/ML INJ
INTRAVENOUS | Status: AC
  Filled 2023-01-08: qty 5

## 2023-01-08 MED ORDER — DEXTROSE-SODIUM CHLORIDE 5-0.9 % IV SOLN: INTRAVENOUS | Status: DC

## 2023-01-08 MED ORDER — HYDROCODONE-ACETAMINOPHEN 5-325 MG PO TABS: 1.0000 | ORAL_TABLET | Freq: Four times a day (QID) | ORAL | 0 refills | Status: AC | PRN

## 2023-01-08 MED ORDER — KETOROLAC TROMETHAMINE 30 MG/ML IJ SOLN
INTRAMUSCULAR | Status: AC
  Filled 2023-01-08: qty 1

## 2023-01-08 MED ORDER — EPHEDRINE SULFATE-NACL 50-0.9 MG/10ML-% IV SOSY
PREFILLED_SYRINGE | INTRAVENOUS | Status: DC | PRN
  Administered 2023-01-08: 10 mg via INTRAVENOUS

## 2023-01-08 MED ORDER — BUPIVACAINE HCL (PF) 0.5 % IJ SOLN
INTRAMUSCULAR | Status: DC | PRN
  Administered 2023-01-08: 10 mL

## 2023-01-08 MED ORDER — DEXAMETHASONE SODIUM PHOSPHATE 10 MG/ML IJ SOLN
INTRAMUSCULAR | Status: AC
  Filled 2023-01-08: qty 2

## 2023-01-08 MED ORDER — PROPOFOL 10 MG/ML IV BOLUS
INTRAVENOUS | Status: DC | PRN
  Administered 2023-01-08: 150 ug/kg/min via INTRAVENOUS
  Administered 2023-01-08: 150 mg via INTRAVENOUS

## 2023-01-08 MED ORDER — ACETAMINOPHEN 10 MG/ML IV SOLN: 1000.0000 mg | Freq: Once | INTRAVENOUS | Status: DC | PRN

## 2023-01-08 MED ORDER — BUPIVACAINE HCL (PF) 0.5 % IJ SOLN
INTRAMUSCULAR | Status: AC
  Filled 2023-01-08: qty 30

## 2023-01-08 MED ORDER — PROPOFOL 10 MG/ML IV BOLUS
INTRAVENOUS | Status: AC
  Filled 2023-01-08: qty 20

## 2023-01-08 MED ORDER — FENTANYL CITRATE (PF) 100 MCG/2ML IJ SOLN
INTRAMUSCULAR | Status: AC
  Filled 2023-01-08: qty 2

## 2023-01-08 MED ORDER — MIDAZOLAM HCL 2 MG/2ML IJ SOLN
INTRAMUSCULAR | Status: AC
  Filled 2023-01-08: qty 2

## 2023-01-08 SURGICAL SUPPLY — 30 items
BNDG COHESIVE 4X5 TAN STRL LF (GAUZE/BANDAGES/DRESSINGS) ×1 IMPLANT
BNDG ELASTIC 2INX 5YD STR LF (GAUZE/BANDAGES/DRESSINGS) ×1 IMPLANT
BNDG ELASTIC 3INX 5YD STR LF (GAUZE/BANDAGES/DRESSINGS) ×1 IMPLANT
BNDG ESMARCH 4X12 STRL LF (GAUZE/BANDAGES/DRESSINGS) ×1 IMPLANT
CHLORAPREP W/TINT 26 (MISCELLANEOUS) ×1 IMPLANT
CORD BIP STRL DISP 12FT (MISCELLANEOUS) ×1 IMPLANT
CUFF TOURN SGL QUICK 18X4 (TOURNIQUET CUFF) ×1 IMPLANT
DRAPE SURG 17X11 SM STRL (DRAPES) ×1 IMPLANT
ELECT REM PT RETURN 9FT ADLT (ELECTROSURGICAL) ×1
ELECTRODE REM PT RTRN 9FT ADLT (ELECTROSURGICAL) ×1 IMPLANT
FORCEPS JEWEL BIP 4-3/4 STR (INSTRUMENTS) ×1 IMPLANT
GAUZE SPONGE 2X2 STRL 8-PLY (GAUZE/BANDAGES/DRESSINGS) ×1 IMPLANT
GAUZE SPONGE 4X4 12PLY STRL (GAUZE/BANDAGES/DRESSINGS) ×1 IMPLANT
GAUZE STRETCH 2X75IN STRL (MISCELLANEOUS) ×1 IMPLANT
GAUZE XEROFORM 1X8 LF (GAUZE/BANDAGES/DRESSINGS) ×1 IMPLANT
GLOVE BIO SURGEON STRL SZ8 (GLOVE) ×2 IMPLANT
GLOVE INDICATOR 8.0 STRL GRN (GLOVE) ×1 IMPLANT
GOWN STRL REUS W/ TWL LRG LVL3 (GOWN DISPOSABLE) ×1 IMPLANT
GOWN STRL REUS W/ TWL XL LVL3 (GOWN DISPOSABLE) ×1 IMPLANT
KIT TURNOVER KIT A (KITS) ×1 IMPLANT
MANIFOLD NEPTUNE II (INSTRUMENTS) ×1 IMPLANT
NS IRRIG 1000ML POUR BTL (IV SOLUTION) ×1 IMPLANT
NS IRRIG 500ML POUR BTL (IV SOLUTION) ×1 IMPLANT
PACK EXTREMITY ARMC (MISCELLANEOUS) ×1 IMPLANT
PAD PREP OB/GYN DISP 24X41 (PERSONAL CARE ITEMS) ×1 IMPLANT
STOCKINETTE IMPERVIOUS 9X36 MD (GAUZE/BANDAGES/DRESSINGS) ×1 IMPLANT
SUT PROLENE 4 0 PS 2 18 (SUTURE) ×1 IMPLANT
SUT VIC AB 3-0 SH 27X BRD (SUTURE) ×1 IMPLANT
TRAP FLUID SMOKE EVACUATOR (MISCELLANEOUS) ×1 IMPLANT
WATER STERILE IRR 500ML POUR (IV SOLUTION) ×1 IMPLANT

## 2023-01-08 NOTE — H&P (Signed)
History of Present Illness:  Ann Peters is a 72 y.o. female who is now 2 years status post an excision of a Dupuytren's nodule from the volar aspect of her right hand in the area of the little MCP joint. She notes that over the past year or so, she has developed a recurrent painful soft tissue mass on the volar aspect of her right hand in the same area. She denies any reinjury to the area and denies any numbness or paresthesias to her fingers. She finds it to be uncomfortable if she grasps or hold objects. She is quite frustrated by its recurrence and would like to have it removed.  Current Outpatient Medications:  albuterol 90 mcg/actuation inhaler Inhale into the lungs  atorvastatin (LIPITOR) 10 MG tablet Take 1 tablet (10 mg total) by mouth once daily 90 tablet 3  cholecalciferol (VITAMIN D3) 5,000 unit capsule Take 1 capsule by mouth once daily  cyanocobalamin (VITAMIN B12) 1000 MCG tablet Take 1,000 mcg by mouth once daily  DULoxetine (CYMBALTA) 60 MG DR capsule Take 2 capsules (120 mg total) by mouth once daily 180 capsule 3  Herbal Supplement Take 2,000 mg by mouth or tube daily with breakfast Herbal Name: Krill Oil  hydrocortisone 2.5 % lotion Apply 1 Application topically two (2) times a day.  pregabalin (LYRICA) 75 MG capsule Take 1 capsule by mouth twice daily 180 capsule 0  rOPINIRole (REQUIP) 0.25 MG tablet Take 1 tablet (0.25 mg total) by mouth at bedtime 90 tablet 3  traZODone (DESYREL) 100 MG tablet Take 1 tablet (100 mg total) by mouth at bedtime 90 tablet 3  valACYclovir (VALTREX) 1000 MG tablet Take 1 tablet (1,000 mg total) by mouth as needed for up to 3 doses 3 tablet 0  fluticasone (FLONASE) 50 mcg/actuation nasal spray Place 2 sprays into both nostrils daily. 48 g 3   Allergies:  Amoxicillin-Pot Clavulanate Vomiting and Nausea And Vomiting  Pegademase Bovine Nausea And Vomiting  Penicillins Nausea And Vomiting and Other (See Comments)  UNSPECIFIED Has patient had a PCN  reaction causing immediate rash, facial/tongue/throat swelling, SOB or lightheadedness with hypotension: no Has patient had a PCN reaction causing severe rash involving mucus membranes or skin necrosis: No Has patient had a PCN reaction that required hospitalization No Has patient had a PCN reaction occurring within the last 10 years: Yes If all of the above answers are "NO", then may proceed with Cephalosporin use.  Past Medical History:  Allergic rhinitis 04/18/2011  Anxiety  Asthma, unspecified asthma severity, unspecified whether complicated, unspecified whether persistent (HHS-HCC) Now  Depression  Diverticulitis  Dupuytren contracture  left hand  Food intolerance 2015  2023  History of pneumonia 1998  Every year I have pneumonia.  Hypertension  Insomnia 11/05/2016  Obesity  Onychomycosis of left great toe 05/10/2015  Osteoarthritis of left hip 10/10/2015  Prediabetes 01/03/2015  Recurrent major depression resistant to treatment (CMS-HCC) 11/15/2013  Sinusitis, unspecified  Vitamin B12 deficiency 12/23/2011  Vitamin D deficiency 09/01/2014   Past Surgical History:   Dupreytren release Left 03/28/2017 (middle finger)  Excision of soft tissue mass, right hand Right 01/24/2021 (Dr.Falecia Vannatter)  BLADDER SUSPENSION  colectomy partial  HYSTERECTOMY  JOINT REPLACEMENT Left Hip  SINUS SURGERY X3   Family History:  Cancer Mother Lyda Kalata  Lymphoma  Sarcoidosis Mother Lyda Kalata  Multiple sclerosis Sister  Breast cancer Sister  Brain cancer Sister   Social History:   Socioeconomic History:  Marital status: Married  Spouse name: Fatma Ryser  Number of children: 3  Occupational History  Occupation: Garment/textile technologist  Tobacco Use  Smoking status: Never  Smokeless tobacco: Never  Vaping Use  Vaping status: Never Used  Substance and Sexual Activity  Alcohol use: Never  Drug use: Never  Sexual activity: Yes  Partners: Male  Birth control/protection: None  Comment:  71 BOTH FIXED  Social History Narrative  12/11/2021  Likes/Enjoys/What fills your day?:  Military Service: None  Driving Status: Reports driving, independently  Home: Two Story  Your Bedrooms is on: First Level  Fewest Steps to enter the home: 4 with railings, 3 steps outback with railings, - 12 - 15 with railings  Other persons in the home: husband, daughter and significant other  Pets: dog  Medical equipment you use daily: None  Medical equipment available in the home: Shower Chair/Bench and grab bar  Dental: no significant dental problems. Last screening Date: April 2023. Screening is: Up to date, Dr. Malen Gauze  Vision: Wears prescription glasses. Last screening Date: August 2023. Screening is: Up to date, [facility]  Hearing: Denies any issues in Hearing .  Dermatology: Dark spots on face and lower left abdomen   Social Drivers of Health:   Financial Resource Strain: Low Risk (02/14/2021)  Received from [facility], [place]  Overall Financial Resource Strain (CARDIA)  Difficulty of Paying Living Expenses: Not hard at all  Food Insecurity: No Food Insecurity (02/14/2021)  Received from [facility], [place]  Hunger Vital Sign  Worried About Running Out of Food in the Last Year: Never true  Ran Out of Food in the Last Year: Never true  Transportation Needs: No Transportation Needs (02/14/2021)  Received from [facility], [place]  [facility] - Transportation  Lack of Transportation (Medical): No  Lack of Transportation (Non-Medical): No   Review of Systems:  A comprehensive 14 point ROS was performed, reviewed, and the pertinent orthopaedic findings are documented in the HPI.  Physical Exam: Vitals:  12/09/22 0952  BP: (!) 120/90  Weight: 95.3 kg (210 lb)  Height: 170.2 cm (5\' 7" )  PainSc: 0-No pain  PainLoc: Hand   General/Constitutional: The patient appears to be well-nourished, well-developed, and in no acute distress. Neuro/Psych: Normal mood  and affect, oriented to person, place and time. Eyes: Non-icteric. Pupils are equal, round, and reactive to light, and exhibit synchronous movement. ENT: Unremarkable. Lymphatic: No palpable adenopathy. Respiratory: Lungs clear to auscultation, Normal chest excursion, No wheezes, and Non-labored breathing Cardiovascular: Regular rate and rhythm. No murmurs. and No edema, swelling or tenderness, except as noted in detailed exam. Integumentary: No impressive skin lesions present, except as noted in detailed exam. Musculoskeletal: Unremarkable, except as noted in detailed exam.  Right hand exam: Skin inspection of the right hand is notable for a firm benign-appearing mass over the volar aspect of the hand between the fourth and fifth metacarpals measuring approximately 1 x 1.2 cm. It is mildly tender to firm palpation. No swelling, erythema, ecchymosis, abrasions, or other skin abnormalities are identified. She exhibits full active and passive range of motion of all digits without any pain or triggering. She is neurovascularly intact to all digits.  Assessment: Dupuytren's disease of palm of right hand .   Plan: The treatment options were discussed with the patient and her husband. In addition, patient educational materials were provided regarding the diagnosis and treatment options. The patient is quite frustrated by her symptoms and functional limitations, and is ready to consider more aggressive treatment options. Therefore, I have  recommended that we proceed with an excision of this recurrent soft tissue mass. The procedure was discussed with the patient, as were the potential risks (including bleeding, infection, nerve and/or blood vessel injury, persistent or recurrent pain, recurrence of the mass, stiffness of the finger, weakness of grip, need for further surgery, blood clots, strokes, heart attacks and/or arhythmias, pneumonia, etc.) and benefits. The patient states her understanding and wishes  to proceed. All of the patient's questions and concerns were answered. She can call any time with further concerns. She will follow up post-surgery, routine.    Paper H&P to be scanned into permanent record. H&P reviewed and patient re-examined. No changes.

## 2023-01-08 NOTE — Anesthesia Procedure Notes (Signed)
Procedure Name: LMA Insertion Date/Time: 01/08/2023 10:58 AM  Performed by: Lysbeth Penner, CRNAPre-anesthesia Checklist: Patient identified, Emergency Drugs available, Suction available and Patient being monitored Patient Re-evaluated:Patient Re-evaluated prior to induction Oxygen Delivery Method: Circle system utilized Preoxygenation: Pre-oxygenation with 100% oxygen Induction Type: IV induction Ventilation: Mask ventilation without difficulty LMA: LMA inserted LMA Size: 3.0 Number of attempts: 1 Placement Confirmation: ETT inserted through vocal cords under direct vision, positive ETCO2 and breath sounds checked- equal and bilateral Tube secured with: Tape Dental Injury: Teeth and Oropharynx as per pre-operative assessment

## 2023-01-08 NOTE — Op Note (Signed)
01/08/2023  11:44 AM  Patient:   Ann Peters  Pre-Op Diagnosis:   Dupuytren's contracture, right palm.  Post-Op Diagnosis:   Same.  Procedure:   Release of Dupuytren's contracture, right palm.  Surgeon:   Maryagnes Amos, MD  Assistant:   None  Anesthesia:   General LMA  Findings:   As above.  Complications:   None  EBL:   0 cc  Fluids:   300 cc crystalloid  TT:   21 minutes at 250 mmHg  Drains:   None  Closure:   4-0 Prolene interrupted sutures  Brief Clinical Note:   The patient is a 72 year old female with a gradually enlarging intermittently tender soft tissue mass on the volar aspect of her right palm over the fifth metacarpal head. This mass has persisted despite medications, activity modification, etc. The patient's history and examination are consistent with a Dupuytren's contracture of the right palm. The patient presents at this time for release of the Dupuytren's contracture of the right palm.  Procedure:   The patient was brought into the operating room and lain in the supine position. After adequate general laryngeal mask anesthesia was achieved, the right hand and upper extremity were prepped with ChloraPrep solution before being draped sterilely. Preoperative antibiotics were administered. A timeout was performed to verify the appropriate surgical site.    A Brunner type zigzag incision was made along the volar aspect of the right little metacarpal beginning just distal to the proximal palmar crease and extending to the MCP flexion crease. The incision was carried down through subcutaneous tissues. The fibrous cord was identified and carefully dissected out from proximal to distal after releasing it proximally. As dissection was carried out, care was taken to identify and protect the common digital nerve and artery on either side of the cord, as well as the underlying flexor tendon. After the mass was removed in its entirety, the adequacy of excision was verified  by palpation as well as visually. After excision of the Dupuytren's tissue, the right little finger MCP and PIP joints could be extended fully.  The wound was copiously irrigated with sterile saline solution before the skin was reapproximated using 4-0 Prolene interrupted sutures. A total of 10 cc of 0.5% plain Sensorcaine was injected in and around the incision to help with postoperative analgesia before a sterile bulky dressing and volar splint extending to the fingertips was applied, maintaining the MCP joints in extension. The patient was then awakened, extubated, and returned to the recovery room in satisfactory condition after tolerating the procedure well.

## 2023-01-08 NOTE — Anesthesia Preprocedure Evaluation (Signed)
Anesthesia Evaluation  Patient identified by MRN, date of birth, ID band Patient awake    Reviewed: Allergy & Precautions, H&P , NPO status , Patient's Chart, lab work & pertinent test results, reviewed documented beta blocker date and time   Airway Mallampati: III  TM Distance: >3 FB Neck ROM: full    Dental  (+) Teeth Intact   Pulmonary asthma , pneumonia, resolved   Pulmonary exam normal        Cardiovascular Exercise Tolerance: Poor negative cardio ROS Normal cardiovascular exam Rate:Normal     Neuro/Psych  PSYCHIATRIC DISORDERS Anxiety Depression     Neuromuscular disease CVA, Residual Symptoms    GI/Hepatic negative GI ROS, Neg liver ROS,,,  Endo/Other  negative endocrine ROS    Renal/GU negative Renal ROS  negative genitourinary   Musculoskeletal   Abdominal   Peds  Hematology  (+) Blood dyscrasia, anemia   Anesthesia Other Findings   Reproductive/Obstetrics negative OB ROS                             Anesthesia Physical Anesthesia Plan  ASA: 3  Anesthesia Plan: General LMA   Post-op Pain Management:    Induction:   PONV Risk Score and Plan: 4 or greater  Airway Management Planned:   Additional Equipment:   Intra-op Plan:   Post-operative Plan:   Informed Consent: I have reviewed the patients History and Physical, chart, labs and discussed the procedure including the risks, benefits and alternatives for the proposed anesthesia with the patient or authorized representative who has indicated his/her understanding and acceptance.       Plan Discussed with: CRNA  Anesthesia Plan Comments:        Anesthesia Quick Evaluation

## 2023-01-08 NOTE — Discharge Instructions (Addendum)
Orthopedic discharge instructions: Keep dressing dry and intact. Keep hand elevated above heart level. May shower after dressing removed on postop day 4 (Sunday). Cover sutures with Band-Aids after drying off. Apply ice to affected area frequently. Take ibuprofen 600 mg TID with meals for 3-5 days, then as necessary. Take ES Tylenol or pain medication as prescribed when needed.  Return for follow-up in 10-14 days or as scheduled.

## 2023-01-08 NOTE — Transfer of Care (Signed)
Immediate Anesthesia Transfer of Care Note  Patient: Ann Peters  Procedure(s) Performed: DUPUYTREN CONTRACTURE RELEASE (Right)  Patient Location: PACU  Anesthesia Type:General  Level of Consciousness: awake and drowsy  Airway & Oxygen Therapy: Patient Spontanous Breathing and Patient connected to face mask oxygen  Post-op Assessment: Report given to RN and Post -op Vital signs reviewed and stable  Post vital signs: Reviewed and stable  Last Vitals:  Vitals Value Taken Time  BP 118/61 01/08/23 1145  Temp    Pulse 84 01/08/23 1146  Resp 14 01/08/23 1146  SpO2 100 % 01/08/23 1146  Vitals shown include unfiled device data.  Last Pain:  Vitals:   01/08/23 0830  TempSrc: Temporal  PainSc: 5          Complications: There were no known notable events for this encounter.

## 2023-01-09 ENCOUNTER — Encounter: Payer: Self-pay | Admitting: Surgery

## 2023-01-09 NOTE — Anesthesia Postprocedure Evaluation (Signed)
Anesthesia Post Note  Patient: Ann Peters  Procedure(s) Performed: DUPUYTREN CONTRACTURE RELEASE (Right)  Patient location during evaluation: PACU Anesthesia Type: General Level of consciousness: awake and alert Pain management: pain level controlled Vital Signs Assessment: post-procedure vital signs reviewed and stable Respiratory status: spontaneous breathing, nonlabored ventilation, respiratory function stable and patient connected to nasal cannula oxygen Cardiovascular status: blood pressure returned to baseline and stable Postop Assessment: no apparent nausea or vomiting Anesthetic complications: no   There were no known notable events for this encounter.   Last Vitals:  Vitals:   01/08/23 1230 01/08/23 1244  BP: 127/79 (!) 144/69  Pulse: 69 70  Resp: 13 15  Temp: 36.6 C 36.6 C  SpO2: 100% 100%    Last Pain:  Vitals:   01/08/23 1244  TempSrc: Temporal  PainSc:                  Yevette Edwards

## 2023-01-13 LAB — SURGICAL PATHOLOGY

## 2023-10-10 ENCOUNTER — Encounter: Payer: Self-pay | Admitting: Internal Medicine

## 2023-10-13 ENCOUNTER — Other Ambulatory Visit: Payer: Self-pay | Admitting: Internal Medicine

## 2023-10-13 DIAGNOSIS — Z1231 Encounter for screening mammogram for malignant neoplasm of breast: Secondary | ICD-10-CM

## 2023-10-13 DIAGNOSIS — N6311 Unspecified lump in the right breast, upper outer quadrant: Secondary | ICD-10-CM

## 2023-10-16 ENCOUNTER — Ambulatory Visit
Admission: RE | Admit: 2023-10-16 | Discharge: 2023-10-16 | Disposition: A | Discharge status: Home / Self Care | Source: Ambulatory Visit | Attending: Internal Medicine | Admitting: Internal Medicine

## 2023-10-16 DIAGNOSIS — N6311 Unspecified lump in the right breast, upper outer quadrant: Secondary | ICD-10-CM | POA: Diagnosis present

## 2023-10-16 DIAGNOSIS — Z1231 Encounter for screening mammogram for malignant neoplasm of breast: Secondary | ICD-10-CM | POA: Diagnosis present

## 2023-12-04 ENCOUNTER — Telehealth: Payer: Self-pay | Admitting: Gastroenterology

## 2023-12-04 NOTE — Telephone Encounter (Addendum)
 Colonoscopy results for 0517/2019 was faxed to [facility] [phone]
